# Patient Record
Sex: Female | Born: 1935 | Race: Black or African American | Hispanic: No | Smoking: Former smoker
Health system: Southern US, Community
[De-identification: ages and names within clinical notes are randomized; demographics above are authoritative.]

## PROBLEM LIST (undated history)

## (undated) DIAGNOSIS — K269 Duodenal ulcer, unspecified as acute or chronic, without hemorrhage or perforation: Secondary | ICD-10-CM

## (undated) DIAGNOSIS — I4891 Unspecified atrial fibrillation: Secondary | ICD-10-CM

## (undated) DIAGNOSIS — J449 Chronic obstructive pulmonary disease, unspecified: Secondary | ICD-10-CM

## (undated) DIAGNOSIS — E538 Deficiency of other specified B group vitamins: Secondary | ICD-10-CM

## (undated) DIAGNOSIS — I2510 Atherosclerotic heart disease of native coronary artery without angina pectoris: Secondary | ICD-10-CM

## (undated) DIAGNOSIS — M1990 Unspecified osteoarthritis, unspecified site: Secondary | ICD-10-CM

## (undated) DIAGNOSIS — D649 Anemia, unspecified: Secondary | ICD-10-CM

## (undated) DIAGNOSIS — Z9981 Dependence on supplemental oxygen: Secondary | ICD-10-CM

## (undated) DIAGNOSIS — K5792 Diverticulitis of intestine, part unspecified, without perforation or abscess without bleeding: Secondary | ICD-10-CM

## (undated) DIAGNOSIS — K5520 Angiodysplasia of colon without hemorrhage: Secondary | ICD-10-CM

## (undated) DIAGNOSIS — E785 Hyperlipidemia, unspecified: Secondary | ICD-10-CM

## (undated) DIAGNOSIS — W19XXXA Unspecified fall, initial encounter: Secondary | ICD-10-CM

## (undated) DIAGNOSIS — J9611 Chronic respiratory failure with hypoxia: Secondary | ICD-10-CM

## (undated) DIAGNOSIS — I10 Essential (primary) hypertension: Secondary | ICD-10-CM

## (undated) DIAGNOSIS — C50919 Malignant neoplasm of unspecified site of unspecified female breast: Secondary | ICD-10-CM

## (undated) DIAGNOSIS — R51 Headache: Secondary | ICD-10-CM

## (undated) DIAGNOSIS — M79642 Pain in left hand: Secondary | ICD-10-CM

## (undated) HISTORY — DX: Unspecified atrial fibrillation: I48.91

## (undated) HISTORY — DX: Diverticulitis of intestine, part unspecified, without perforation or abscess without bleeding: K57.92

## (undated) HISTORY — DX: Chronic obstructive pulmonary disease, unspecified: J44.9

## (undated) HISTORY — PX: EYE SURGERY: SHX253

## (undated) HISTORY — PX: MASTECTOMY: SHX3

## (undated) HISTORY — DX: Malignant neoplasm of unspecified site of unspecified female breast: C50.919

## (undated) HISTORY — DX: Unspecified fall, initial encounter: W19.XXXA

## (undated) HISTORY — DX: Hyperlipidemia, unspecified: E78.5

## (undated) HISTORY — PX: OTHER SURGICAL HISTORY: SHX169

## (undated) HISTORY — DX: Pain in left hand: M79.642

---

## 1988-11-15 HISTORY — PX: CORONARY ARTERY BYPASS GRAFT: SHX141

## 2011-07-19 ENCOUNTER — Inpatient Hospital Stay (HOSPITAL_COMMUNITY)
Admission: EM | Admit: 2011-07-19 | Discharge: 2011-07-23 | DRG: 812 | Disposition: A | Discharge status: Home / Self Care | Payer: Medicare Other | Attending: Internal Medicine | Admitting: Internal Medicine

## 2011-07-19 ENCOUNTER — Other Ambulatory Visit: Payer: Self-pay

## 2011-07-19 ENCOUNTER — Emergency Department (HOSPITAL_COMMUNITY): Payer: Medicare Other

## 2011-07-19 DIAGNOSIS — D649 Anemia, unspecified: Secondary | ICD-10-CM | POA: Diagnosis present

## 2011-07-19 DIAGNOSIS — I509 Heart failure, unspecified: Secondary | ICD-10-CM | POA: Diagnosis present

## 2011-07-19 DIAGNOSIS — D126 Benign neoplasm of colon, unspecified: Secondary | ICD-10-CM | POA: Diagnosis present

## 2011-07-19 DIAGNOSIS — I10 Essential (primary) hypertension: Secondary | ICD-10-CM | POA: Diagnosis present

## 2011-07-19 DIAGNOSIS — D509 Iron deficiency anemia, unspecified: Principal | ICD-10-CM | POA: Diagnosis present

## 2011-07-19 DIAGNOSIS — K922 Gastrointestinal hemorrhage, unspecified: Secondary | ICD-10-CM

## 2011-07-19 DIAGNOSIS — Z7901 Long term (current) use of anticoagulants: Secondary | ICD-10-CM

## 2011-07-19 DIAGNOSIS — R0600 Dyspnea, unspecified: Secondary | ICD-10-CM | POA: Diagnosis present

## 2011-07-19 DIAGNOSIS — I2510 Atherosclerotic heart disease of native coronary artery without angina pectoris: Secondary | ICD-10-CM | POA: Diagnosis present

## 2011-07-19 DIAGNOSIS — I4891 Unspecified atrial fibrillation: Secondary | ICD-10-CM | POA: Diagnosis present

## 2011-07-19 DIAGNOSIS — D62 Acute posthemorrhagic anemia: Secondary | ICD-10-CM

## 2011-07-19 DIAGNOSIS — I5030 Unspecified diastolic (congestive) heart failure: Secondary | ICD-10-CM | POA: Diagnosis present

## 2011-07-19 DIAGNOSIS — Z951 Presence of aortocoronary bypass graft: Secondary | ICD-10-CM

## 2011-07-19 HISTORY — DX: Headache: R51

## 2011-07-19 HISTORY — DX: Anemia, unspecified: D64.9

## 2011-07-19 HISTORY — DX: Unspecified osteoarthritis, unspecified site: M19.90

## 2011-07-19 LAB — PROTIME-INR
INR: 1.95 — ABNORMAL HIGH (ref 0.00–1.49)
Prothrombin Time: 22.6 seconds — ABNORMAL HIGH (ref 11.6–15.2)

## 2011-07-19 LAB — URINALYSIS, ROUTINE W REFLEX MICROSCOPIC
Nitrite: NEGATIVE
Specific Gravity, Urine: 1.01 (ref 1.005–1.030)
Urobilinogen, UA: 4 mg/dL — ABNORMAL HIGH (ref 0.0–1.0)
pH: 6.5 (ref 5.0–8.0)

## 2011-07-19 LAB — DIFFERENTIAL
Eosinophils Absolute: 0.1 10*3/uL (ref 0.0–0.7)
Lymphs Abs: 2 10*3/uL (ref 0.7–4.0)
Monocytes Relative: 16 % — ABNORMAL HIGH (ref 3–12)
Neutro Abs: 6.4 10*3/uL (ref 1.7–7.7)
Neutrophils Relative %: 63 % (ref 43–77)

## 2011-07-19 LAB — BASIC METABOLIC PANEL
Calcium: 9.8 mg/dL (ref 8.4–10.5)
GFR calc Af Amer: >60 mL/min (ref >60)
GFR calc non Af Amer: >60 mL/min (ref >60)
Potassium: 3.7 mEq/L (ref 3.5–5.1)
Sodium: 141 mEq/L (ref 135–145)

## 2011-07-19 LAB — RETICULOCYTES
RBC.: 3.85 MIL/uL — ABNORMAL LOW (ref 3.87–5.11)
Retic Count, Absolute: 50.1 10*3/uL (ref 19.0–186.0)

## 2011-07-19 LAB — PRO B NATRIURETIC PEPTIDE: Pro B Natriuretic peptide (BNP): 747.3 pg/mL — ABNORMAL HIGH (ref 0–125)

## 2011-07-19 LAB — CBC
Hemoglobin: 6.4 g/dL — CL (ref 12.0–15.0)
MCH: 16.7 pg — ABNORMAL LOW (ref 26.0–34.0)
Platelets: 459 10*3/uL — ABNORMAL HIGH (ref 150–400)
RBC: 3.83 MIL/uL — ABNORMAL LOW (ref 3.87–5.11)
WBC: 10.2 10*3/uL (ref 4.0–10.5)

## 2011-07-19 LAB — URINE MICROSCOPIC-ADD ON

## 2011-07-19 MED ORDER — ONDANSETRON HCL 4 MG/2ML IJ SOLN: 4.0000 mg | Freq: Four times a day (QID) | INTRAMUSCULAR | Status: DC | PRN

## 2011-07-19 MED ORDER — FUROSEMIDE 10 MG/ML IJ SOLN
20.0000 mg | Freq: Once | INTRAMUSCULAR | Status: AC
  Administered 2011-07-20: 20 mg via INTRAVENOUS
  Filled 2011-07-19: qty 2

## 2011-07-19 MED ORDER — ACETAMINOPHEN 650 MG RE SUPP: 650.0000 mg | Freq: Four times a day (QID) | RECTAL | Status: DC | PRN

## 2011-07-19 MED ORDER — ONDANSETRON HCL 4 MG PO TABS: 4.0000 mg | ORAL_TABLET | Freq: Four times a day (QID) | ORAL | Status: DC | PRN

## 2011-07-19 MED ORDER — MORPHINE SULFATE 2 MG/ML IJ SOLN: 2.0000 mg | INTRAMUSCULAR | Status: DC | PRN

## 2011-07-19 MED ORDER — SIMVASTATIN 20 MG PO TABS
20.0000 mg | ORAL_TABLET | Freq: Every day | ORAL | Status: DC
  Administered 2011-07-19 – 2011-07-22 (×4): 20 mg via ORAL
  Filled 2011-07-19 (×4): qty 1

## 2011-07-19 MED ORDER — NITROGLYCERIN 2 % TD OINT
0.5000 [in_us] | TOPICAL_OINTMENT | Freq: Four times a day (QID) | TRANSDERMAL | Status: DC
  Administered 2011-07-20 (×2): 0.5 [in_us] via TOPICAL
  Administered 2011-07-20: 01:00:00 via TOPICAL
  Administered 2011-07-20: 0.5 [in_us] via TOPICAL
  Administered 2011-07-21: via TOPICAL
  Administered 2011-07-21: 0.5 [in_us] via TOPICAL
  Administered 2011-07-21: 06:00:00 via TOPICAL
  Administered 2011-07-21 – 2011-07-22 (×2): 0.5 [in_us] via TOPICAL
  Administered 2011-07-22: 01:00:00 via TOPICAL
  Administered 2011-07-22 – 2011-07-23 (×4): 0.5 [in_us] via TOPICAL
  Filled 2011-07-19 (×14): qty 1

## 2011-07-19 MED ORDER — SODIUM CHLORIDE 0.9 % IV SOLN
INTRAVENOUS | Status: AC
  Administered 2011-07-19: 23:00:00 via INTRAVENOUS

## 2011-07-19 MED ORDER — SODIUM CHLORIDE 0.9 % IV SOLN: 250.0000 mL | INTRAVENOUS | Status: DC

## 2011-07-19 MED ORDER — NITROGLYCERIN 2 % TD OINT
0.5000 [in_us] | TOPICAL_OINTMENT | Freq: Once | TRANSDERMAL | Status: AC
  Administered 2011-07-19: 19:00:00 via TOPICAL
  Filled 2011-07-19: qty 1

## 2011-07-19 MED ORDER — OXYCODONE HCL 5 MG PO TABS: 5.0000 mg | ORAL_TABLET | ORAL | Status: DC | PRN

## 2011-07-19 MED ORDER — PANTOPRAZOLE SODIUM 40 MG IV SOLR
40.0000 mg | Freq: Two times a day (BID) | INTRAVENOUS | Status: DC
  Administered 2011-07-20 – 2011-07-21 (×4): 40 mg via INTRAVENOUS
  Filled 2011-07-19 (×4): qty 40

## 2011-07-19 MED ORDER — SODIUM CHLORIDE 0.9 % IV SOLN: INTRAVENOUS | Status: DC

## 2011-07-19 MED ORDER — FUROSEMIDE 10 MG/ML IJ SOLN
40.0000 mg | Freq: Two times a day (BID) | INTRAMUSCULAR | Status: DC
  Administered 2011-07-20 – 2011-07-21 (×3): 40 mg via INTRAVENOUS
  Filled 2011-07-19 (×3): qty 4

## 2011-07-19 MED ORDER — METOPROLOL TARTRATE 50 MG PO TABS
50.0000 mg | ORAL_TABLET | Freq: Two times a day (BID) | ORAL | Status: DC
  Administered 2011-07-19 – 2011-07-23 (×8): 50 mg via ORAL
  Filled 2011-07-19 (×8): qty 1

## 2011-07-19 MED ORDER — NITROGLYCERIN 0.4 MG SL SUBL
0.4000 mg | SUBLINGUAL_TABLET | SUBLINGUAL | Status: DC | PRN
  Administered 2011-07-19: 0.4 mg via SUBLINGUAL
  Filled 2011-07-19: qty 25

## 2011-07-19 MED ORDER — ACETAMINOPHEN 325 MG PO TABS
650.0000 mg | ORAL_TABLET | Freq: Four times a day (QID) | ORAL | Status: DC | PRN
  Administered 2011-07-21 – 2011-07-22 (×4): 650 mg via ORAL
  Filled 2011-07-19 (×4): qty 2

## 2011-07-19 MED ORDER — SODIUM CHLORIDE 0.9 % IJ SOLN
3.0000 mL | INTRAMUSCULAR | Status: DC | PRN
  Filled 2011-07-19: qty 3

## 2011-07-19 MED ORDER — SODIUM CHLORIDE 0.9 % IJ SOLN
3.0000 mL | Freq: Two times a day (BID) | INTRAMUSCULAR | Status: DC
  Administered 2011-07-19 – 2011-07-22 (×5): 3 mL via INTRAVENOUS
  Filled 2011-07-19 (×5): qty 3

## 2011-07-19 MED ORDER — PANTOPRAZOLE SODIUM 40 MG IV SOLR
40.0000 mg | Freq: Once | INTRAVENOUS | Status: AC
  Administered 2011-07-19: 40 mg via INTRAVENOUS
  Filled 2011-07-19: qty 40

## 2011-07-19 MED ORDER — ALBUTEROL SULFATE (5 MG/ML) 0.5% IN NEBU: 2.5000 mg | INHALATION_SOLUTION | RESPIRATORY_TRACT | Status: DC | PRN

## 2011-07-19 MED ORDER — FUROSEMIDE 10 MG/ML IJ SOLN
40.0000 mg | Freq: Once | INTRAMUSCULAR | Status: AC
  Administered 2011-07-19: 40 mg via INTRAVENOUS
  Filled 2011-07-19: qty 4

## 2011-07-19 NOTE — H&P (Signed)
Yesenia Woods is an 76 y.o. female.  Her primary care physician is Dr. Byrd Hesselbach in [place], [place]. Her cardiologist is Dr. Earna Coder in [place], [place].  Chief Complaint: Shortness of breath for one week  HPI: This is a 76 year old, African American female, with a past medical history coronary artery disease, who was in her usual state of health till about a week ago, when she started having worsening shortness of breath. She also had some chest fullness as she describes it. This was associated with some leg swelling, more so in the left leg. She denies any weight gain. She would get short of breath, even with short distances. She usually otherwise is quite independent with her physical activity. She denies any fever or cough. Has been having some chills. Admits to some palpitations. Admits to orthopnea and PND. She tells that she was put on Coumadin for unclear reasons about 6 months ago. She tells me she could have had a regular arrhythmias, but she is not absolutely sure. Denies any sick contacts. Denies any blood in the stool or black colored stools. She's had a colonoscopy, but many years ago, and she doesn't recall it. She was found to have heme positive stool in the emergency department.   Prior to Admission medications   Medication Sig Start Date End Date Taking? Authorizing Provider  aspirin 325 MG EC tablet Take 325 mg by mouth daily.     Yes Historical Provider, MD  metoprolol (LOPRESSOR) 50 MG tablet Take 50 mg by mouth 2 (two) times daily.     Yes Historical Provider, MD  omeprazole (PRILOSEC) 20 MG capsule Take 20 mg by mouth daily.     Yes Historical Provider, MD  pravastatin (PRAVACHOL) 40 MG tablet Take 40 mg by mouth at bedtime.     Yes Historical Provider, MD  warfarin (COUMADIN) 4 MG tablet Take 4 mg by mouth daily. Takes Tuesday and Fridays    Yes Historical Provider, MD  warfarin (COUMADIN) 7.5 MG tablet Take 7.5 mg by mouth daily. Except for Tuesday and Friday    Yes  Historical Provider, MD    Allergies: No Known Allergies  Past Medical History  Diagnosis Date  . Myocardial infarct   . Cancer     breast   she has a history of cardiac disease. She status post CABG about 24 years ago. She's had the cardiac catheterization since then, however, did not require stent placement. She denies any history of, diabetes, or strokes. Does have hypertension, and high cholesterol. Once, again. History of A. fib, is questionable.  Past Surgical History  Procedure Date  . Coronary artery bypass graft   . Breast surgery    She's also had cataract surgeries and foot surgeries.  Social History:  reports that she has quit smoking. She does not have any smokeless tobacco history on file. She reports that she does not drink alcohol or use illicit drugs. she lives by herself in [place], [place].  Family History: No family history on file.  Review of Systems  Constitutional: Positive for chills. Negative for fever, weight loss and malaise/fatigue.  HENT: Negative.   Eyes: Negative.   Respiratory: Positive for shortness of breath. Negative for cough, hemoptysis, sputum production and wheezing.   Cardiovascular: Positive for chest pain, palpitations, orthopnea, leg swelling and PND.  Gastrointestinal: Positive for heartburn. Negative for nausea, vomiting, abdominal pain, blood in stool and melena.  Genitourinary: Negative.   Musculoskeletal: Negative.   Skin: Negative.   Neurological: Positive for  weakness.  Endo/Heme/Allergies: Negative.   Psychiatric/Behavioral: Negative.     Blood pressure 157/126, pulse 58, temperature 98.3 F (36.8 C), temperature source Oral, resp. rate 20, height 5' (1.524 m), weight 70.308 kg (155 lb), SpO2 95.00%. Physical Exam  Vitals reviewed. Constitutional: She is oriented to person, place, and time. She appears well-developed and well-nourished. No distress.  HENT:  Head: Normocephalic.  Mouth/Throat: No oropharyngeal exudate.    Eyes: EOM are normal. Right eye exhibits no discharge. Left eye exhibits no discharge. No scleral icterus.  Neck: Normal range of motion. No tracheal deviation present. No thyromegaly present.  Cardiovascular: Normal rate.  An irregular rhythm present.  No extrasystoles are present. Exam reveals no gallop.   No murmur heard. Pulmonary/Chest: Effort normal. Stridor present. No respiratory distress. She has no wheezes. She has no rales. She exhibits no tenderness.  Abdominal: Soft. She exhibits no distension and no mass. There is no tenderness. There is no rebound and no guarding.  Musculoskeletal: Normal range of motion.  Lymphadenopathy:    She has no cervical adenopathy.  Neurological: She is alert and oriented to person, place, and time.  Skin: Skin is warm and dry. She is not diaphoretic.  Psychiatric: She has a normal mood and affect.     Results for orders placed during the hospital encounter of 07/19/11 (from the past 48 hour(s))  URINALYSIS, ROUTINE W REFLEX MICROSCOPIC     Status: Abnormal   Collection Time   07/19/11  2:16 PM      Component Value Range Comment   Color, Urine YELLOW  YELLOW     Appearance HAZY (*) CLEAR     Specific Gravity, Urine 1.010  1.005 - 1.030     pH 6.5  5.0 - 8.0     Glucose, UA NEGATIVE  NEGATIVE (mg/dL)    Hgb urine dipstick TRACE (*) NEGATIVE     Bilirubin Urine NEGATIVE  NEGATIVE     Ketones, ur NEGATIVE  NEGATIVE (mg/dL)    Protein, ur NEGATIVE  NEGATIVE (mg/dL)    Urobilinogen, UA 4.0 (*) 0.0 - 1.0 (mg/dL)    Nitrite NEGATIVE  NEGATIVE     Leukocytes, UA SMALL (*) NEGATIVE    URINE MICROSCOPIC-ADD ON     Status: Normal   Collection Time   07/19/11  2:16 PM      Component Value Range Comment   Squamous Epithelial / LPF RARE  RARE     WBC, UA 7-10  <3 (WBC/hpf)    RBC / HPF 3-6  <3 (RBC/hpf)    Bacteria, UA RARE  RARE    BASIC METABOLIC PANEL     Status: Normal   Collection Time   07/19/11  2:20 PM      Component Value Range Comment    Sodium 141  135 - 145 (mEq/L)    Potassium 3.7  3.5 - 5.1 (mEq/L)    Chloride 104  96 - 112 (mEq/L)    CO2 26  19 - 32 (mEq/L)    Glucose, Bld 97  70 - 99 (mg/dL)    BUN 10  6 - 23 (mg/dL)    Creatinine, Ser 9.56  0.50 - 1.10 (mg/dL)    Calcium 9.8  8.4 - 10.5 (mg/dL)    GFR calc non Af Amer >60  >60 (mL/min)    GFR calc Af Amer >60  >60 (mL/min)   CBC     Status: Abnormal   Collection Time   07/19/11  2:20 PM  Component Value Range Comment   WBC 10.2  4.0 - 10.5 (K/uL)    RBC 3.83 (*) 3.87 - 5.11 (MIL/uL)    Hemoglobin 6.4 (*) 12.0 - 15.0 (g/dL)    HCT 16.1 (*) 09.6 - 46.0 (%)    MCV 58.2 (*) 78.0 - 100.0 (fL)    MCH 16.7 (*) 26.0 - 34.0 (pg)    MCHC 28.7 (*) 30.0 - 36.0 (g/dL)    RDW 04.5 (*) 40.9 - 15.5 (%)    Platelets 459 (*) 150 - 400 (K/uL)   DIFFERENTIAL     Status: Abnormal   Collection Time   07/19/11  2:20 PM      Component Value Range Comment   Neutrophils Relative 63  43 - 77 (%)    Neutro Abs 6.4  1.7 - 7.7 (K/uL)    Lymphocytes Relative 20  12 - 46 (%)    Lymphs Abs 2.0  0.7 - 4.0 (K/uL)    Monocytes Relative 16 (*) 3 - 12 (%)    Monocytes Absolute 1.6 (*) 0.1 - 1.0 (K/uL)    Eosinophils Relative 1  0 - 5 (%)    Eosinophils Absolute 0.1  0.0 - 0.7 (K/uL)    Basophils Relative 1  0 - 1 (%)    Basophils Absolute 0.1  0.0 - 0.1 (K/uL)   PRO B NATRIURETIC PEPTIDE     Status: Abnormal   Collection Time   07/19/11  2:20 PM      Component Value Range Comment   BNP, POC 747.3 (*) 0 - 125 (pg/mL)   PROTIME-INR     Status: Abnormal   Collection Time   07/19/11  2:20 PM      Component Value Range Comment   Prothrombin Time 22.6 (*) 11.6 - 15.2 (seconds)    INR 1.95 (*) 0.00 - 1.49    APTT     Status: Abnormal   Collection Time   07/19/11  2:20 PM      Component Value Range Comment   aPTT 40 (*) 24 - 37 (seconds)   POCT I-STAT TROPONIN I     Status: Normal   Collection Time   07/19/11  2:34 PM      Component Value Range Comment   Troponin i, poc 0.01  0.00 - 0.08  (ng/mL)    Comment 3            TYPE AND SCREEN     Status: Normal   Collection Time   07/19/11  3:40 PM      Component Value Range Comment   ABO/RH(D) O POS      Antibody Screen POS      Sample Expiration 07/22/2011     ABO/RH     Status: Normal (Preliminary result)   Collection Time   07/19/11  3:40 PM      Component Value Range Comment   ABO/RH(D) O POS     PREPARE RBC (CROSSMATCH)     Status: Normal   Collection Time   07/19/11  3:40 PM      Component Value Range Comment   Order Confirmation ORDER PROCESSED BY BLOOD BANK     POCT I-STAT TROPONIN I     Status: Normal   Collection Time   07/19/11  6:28 PM      Component Value Range Comment   Troponin i, poc 0.00  0.00 - 0.08 (ng/mL)    Comment 3  Dg Chest 2 View  07/19/2011  *RADIOLOGY REPORT*  Clinical Data: Chest pain and shortness of breath.  CHEST - 2 VIEW  Comparison: None.  Findings: Trachea is midline.  Heart is at the upper limits of normal in size.  Peripheral septal lines are seen in the lung bases.  Bibasilar atelectasis and/or scarring.  No definite pleural fluid.  IMPRESSION: Early basilar dependent interstitial pulmonary edema.  Original Report Authenticated By: Reyes Ivan, M.D.   There are 2 EKGs available and both of which look quite similar. They show what appears to be a sinus rhythm. However, on the monitor she has irregular rhythm currently. Axes normal. Intervals appear to be in the normal range, although she does have first-degree AV block. She has ST depression in V4 5 and 6. Similar findings in both EKGs. We don't have any old EKGs for comparison.  Assessment/Plan  Principal Problem:  *Dyspnea Active Problems:  Anemia  CAD (coronary artery disease)  HTN (hypertension)  Anticoagulated on warfarin   #1 dyspnea: This is probably from anemia. It is unclear if she has a history of CHF. She's not wheezing at this time. So, I feel that this is symptomatic anemia. She did get a dose of Lasix in the  ED, however, she is not able to tell me she felt better after that or not. We will continue with IV Lasix for now. She'll probably require only at about 3-4 doses. At some point an x-ray will have to be repeated of her chest. We'll order an echocardiogram. She tells me she was recently tested in her cardiologist's office and will request those records.  #2 microcytic anemia with heme positive stools: Patient is on warfarin. We'll check an anemia panel. Since she has symptomatic anemia we will transfuse her blood. We will have gastroenterology see her, as it's quite likely she may require a colonoscopy. We'll put on a PPI as well. Request old records.  #3 coronary artery disease, status post CABG: This issue  Is stable. Continue with beta blocker. Hold of on aspirin for now. Repeat EKG to get a better look at rhythm.  #4 on anticoagulation: The reason for this is not entirely clear. It looks like she may have had paroxysmal A. fib, which may have prompted this. INR is subtherapeutic. Because of the severe anemia and heme positive, stools we'll hold off on Coumadin. However, because she's not actively bleeding we will not give her any vitamin K or FFP at this time.  DVT, prophylaxis with SCDs. She's a full code. She can be admitted to tele as she is hemodynamically stable.  Further management decisions will depend on results of further testing and patient's response to treatment  Euretha Najarro 07/19/2011, 7:39 PM

## 2011-07-19 NOTE — ED Notes (Signed)
Sob with chest "tightness" for 1 week per pt. Denies nausea or radiation of pain. Sob worse with exertion and pt states " i have to sleep with 3 pillows at night"

## 2011-07-19 NOTE — ED Notes (Signed)
guiac positive

## 2011-07-19 NOTE — ED Notes (Signed)
Lab called and stated pt had antibody in blood and will have to send to [facility] and will be approx 2 hour delay. Dr. Clarene Duke aware.

## 2011-07-19 NOTE — ED Notes (Signed)
Pt c/o chest tightness and sob for 1 week at home. Exertion makes symptoms worse. Denies any nausea. Denies any radiation of pain. Pt has Cardiologist Dr. Beckie Salts in [place], [place]. Pt states within last she has increased to 3 pillows at night.

## 2011-07-19 NOTE — ED Notes (Signed)
Troponin I stat done but did not cross over. Resulted 0.01ng/ml-C [place]

## 2011-07-19 NOTE — ED Notes (Addendum)
CRITICAL VALUE ALERT  Critical value received:  HGB 6.4   Date of notification: 07/19/11  Time of notification:  1514  Critical value read back:yes  Nurse who received alert:  Aquilla Hacker  MD notified (1st page):  Dr Richrd Prime  Time MD Notified: [phone]

## 2011-07-19 NOTE — ED Provider Notes (Signed)
History     CSN: [id number] Arrival date & time: 07/19/2011  1:39 PM  Chief Complaint  Patient presents with  . Chest Pain  . Shortness of Breath   HPI Pt was seen at 1430.  Per pt, c/o gradual onset and worsening of persistent CP and SOb x1 week.  Describes the CP as "tightness."  Symptoms occur on exertion.  States she has been increasing the number of pillows she props herself up on at night to sleep, as well as is unable to walk the same distances she usually did due to increasing symptoms of SOB and chest tightness.  Denies palpitations, no cough, no fevers, no abd pain, no back pain, no N/V/D.      Cards:  Dr. Beckie Salts in [place], [place] Past Medical History  Diagnosis Date  . Myocardial infarct   . Cancer     breast    Past Surgical History  Procedure Date  . Coronary artery bypass graft   . Breast surgery     No family history on file.  History  Substance Use Topics  . Smoking status: Former Games developer  . Smokeless tobacco: Not on file  . Alcohol Use: No    Review of Systems ROS: Statement: All systems negative except as marked or noted in the HPI; Constitutional: Negative for fever and chills. ; ; Eyes: Negative for eye pain, redness and discharge. ; ; ENMT: Negative for ear pain, hoarseness, nasal congestion, sinus pressure and sore throat. ; ; Cardiovascular: Negative for palpitations, diaphoresis, and peripheral edema. +CP, SOB, DOE, orthopnea ; Respiratory: Negative for cough, wheezing and stridor. ; ; Gastrointestinal: Negative for nausea, vomiting, diarrhea and abdominal pain, blood in stool, hematemesis, jaundice and rectal bleeding. . ; ; Genitourinary: Negative for dysuria, flank pain and hematuria. ; ; Musculoskeletal: Negative for back pain and neck pain. Negative for swelling and trauma.; ; Skin: Negative for pruritus, rash, abrasions, blisters, bruising and skin lesion.; ; Neuro: Negative for headache, lightheadedness and neck stiffness. Negative for weakness, altered  level of consciousness , altered mental status, extremity weakness, paresthesias, involuntary movement, seizure and syncope.     Physical Exam  BP 157/126  Pulse 58  Temp(Src) 98.3 F (36.8 C) (Oral)  Resp 20  Ht 5' (1.524 m)  Wt 155 lb (70.308 kg)  BMI 30.27 kg/m2  SpO2 95% Patient Vitals for the past 24 hrs:  BP Temp Temp src Pulse Resp SpO2 Height Weight  07/19/11 1526 157/126 mmHg - - 58  - 95 % - -  07/19/11 1512 153/65 mmHg - - 78  20  100 % - -  07/19/11 1409 - - - - - 97 % - -  07/19/11 1405 174/69 mmHg 98.3 F (36.8 C) Oral 62  22  98 % 5' (1.524 m) 155 lb (70.308 kg)     Physical Exam 1435: Physical examination:  Nursing notes reviewed; Vital signs and O2 SAT reviewed;  Constitutional: Well developed, Well nourished, Well hydrated, In no acute distress; Head:  Normocephalic, atraumatic; Eyes: EOMI, PERRL, No scleral icterus; ENMT: Mouth and pharynx normal, Mucous membranes moist, conjunctiva pale; Neck: Supple, Full range of motion, No lymphadenopathy; Cardiovascular: Regular rate and rhythm, No murmur, rub, or gallop; Respiratory: Breath sounds clear & equal bilaterally, No rales, rhonchi, wheezes, or rub, Normal respiratory effort/excursion; Chest: Nontender, Movement normal; Abdomen: Soft, Nontender, Nondistended, Normal bowel sounds; Extremities: Pulses normal, No tenderness, No edema, No calf edema or asymmetry.; Neuro: AA&Ox3, Major CN grossly intact.  Speech  clear, no facial droop.  No gross focal motor or sensory deficits in extremities.; Skin: Color pale, Warm, Dry.   ED Course  Procedures  MDM:  No old records to compare. MDM Reviewed: nursing note and vitals Interpretation: ECG, labs and x-ray    Date: 07/19/2011  Rate: 62  Rhythm: normal sinus rhythm  QRS Axis: normal  Intervals: PR prolonged  ST/T Wave abnormalities: ST depressions anteriorly and ST depressions laterally  Conduction Disutrbances:first-degree A-V block   Narrative Interpretation:    Old EKG Reviewed: none available, no previous EKG's on file.  Results for orders placed during the hospital encounter of 07/19/11  BASIC METABOLIC PANEL      Component Value Range   Sodium 141  135 - 145 (mEq/L)   Potassium 3.7  3.5 - 5.1 (mEq/L)   Chloride 104  96 - 112 (mEq/L)   CO2 26  19 - 32 (mEq/L)   Glucose, Bld 97  70 - 99 (mg/dL)   BUN 10  6 - 23 (mg/dL)   Creatinine, Ser 1.61  0.50 - 1.10 (mg/dL)   Calcium 9.8  8.4 - 09.6 (mg/dL)   GFR calc non Af Amer >60  >60 (mL/min)   GFR calc Af Amer >60  >60 (mL/min)  CBC      Component Value Range   WBC 10.2  4.0 - 10.5 (K/uL)   RBC 3.83 (*) 3.87 - 5.11 (MIL/uL)   Hemoglobin 6.4 (*) 12.0 - 15.0 (g/dL)   HCT 04.5 (*) 40.9 - 46.0 (%)   MCV 58.2 (*) 78.0 - 100.0 (fL)   MCH 16.7 (*) 26.0 - 34.0 (pg)   MCHC 28.7 (*) 30.0 - 36.0 (g/dL)   RDW 81.1 (*) 91.4 - 15.5 (%)   Platelets 459 (*) 150 - 400 (K/uL)  DIFFERENTIAL      Component Value Range   Neutrophils Relative 63  43 - 77 (%)   Neutro Abs 6.4  1.7 - 7.7 (K/uL)   Lymphocytes Relative 20  12 - 46 (%)   Lymphs Abs 2.0  0.7 - 4.0 (K/uL)   Monocytes Relative 16 (*) 3 - 12 (%)   Monocytes Absolute 1.6 (*) 0.1 - 1.0 (K/uL)   Eosinophils Relative 1  0 - 5 (%)   Eosinophils Absolute 0.1  0.0 - 0.7 (K/uL)   Basophils Relative 1  0 - 1 (%)   Basophils Absolute 0.1  0.0 - 0.1 (K/uL)  PRO B NATRIURETIC PEPTIDE      Component Value Range   BNP, POC 747.3 (*) 0 - 125 (pg/mL)  PROTIME-INR      Component Value Range   Prothrombin Time 22.6 (*) 11.6 - 15.2 (seconds)   INR 1.95 (*) 0.00 - 1.49   APTT      Component Value Range   aPTT 40 (*) 24 - 37 (seconds)  URINALYSIS, ROUTINE W REFLEX MICROSCOPIC      Component Value Range   Color, Urine YELLOW  YELLOW    Appearance HAZY (*) CLEAR    Specific Gravity, Urine 1.010  1.005 - 1.030    pH 6.5  5.0 - 8.0    Glucose, UA NEGATIVE  NEGATIVE (mg/dL)   Hgb urine dipstick TRACE (*) NEGATIVE    Bilirubin Urine NEGATIVE  NEGATIVE     Ketones, ur NEGATIVE  NEGATIVE (mg/dL)   Protein, ur NEGATIVE  NEGATIVE (mg/dL)   Urobilinogen, UA 4.0 (*) 0.0 - 1.0 (mg/dL)   Nitrite NEGATIVE  NEGATIVE    Leukocytes, UA  SMALL (*) NEGATIVE   URINE MICROSCOPIC-ADD ON      Component Value Range   Squamous Epithelial / LPF RARE  RARE    WBC, UA 7-10  <3 (WBC/hpf)   RBC / HPF 3-6  <3 (RBC/hpf)   Bacteria, UA RARE  RARE   TYPE AND SCREEN      Component Value Range   ABO/RH(D) O POS     Antibody Screen POS     Sample Expiration 07/22/2011    ABO/RH      Component Value Range   ABO/RH(D) O POS    PREPARE RBC (CROSSMATCH)      Component Value Range   Order Confirmation ORDER PROCESSED BY BLOOD BANK    POCT I-STAT TROPONIN I      Component Value Range   Troponin i, poc 0.01  0.00 - 0.08 (ng/mL)   Comment 3            Dg Chest 2 View  07/19/2011  *RADIOLOGY REPORT*  Clinical Data: Chest pain and shortness of breath.  CHEST - 2 VIEW  Comparison: None.  Findings: Trachea is midline.  Heart is at the upper limits of normal in size.  Peripheral septal lines are seen in the lung bases.  Bibasilar atelectasis and/or scarring.  No definite pleural fluid.  IMPRESSION: Early basilar dependent interstitial pulmonary edema.  Original Report Authenticated By: Reyes Ivan, M.D.    5:34 PM:  Pt denies having black or bloody stools.  Soft brown stool in rectal vault heme positive.  T&S pending due to antibodies in pt's blood.  Will need transfusion.  Cannot recall why she takes coumadin, only that her Cards MD put her on it.  Denies symptoms of SOB or CP while laying on stretcher.  Will tx CHF for now.  Dx testing d/w pt and family.  Questions answered.  Verb understanding, agreeable to admit.  5:40 PM:  T/C to [facility] Dr. Ardyth Harps, case discussed, including:  HPI, pertinent PM/SHx, VS/PE, dx testing, ED course and treatment.  Agreeable to admit.  Requests to write temporary orders, ICU bed to team AP2.  Gertrude Bucks Allison Quarry,  DO 07/20/11 2216

## 2011-07-20 DIAGNOSIS — K922 Gastrointestinal hemorrhage, unspecified: Secondary | ICD-10-CM

## 2011-07-20 DIAGNOSIS — I059 Rheumatic mitral valve disease, unspecified: Secondary | ICD-10-CM

## 2011-07-20 LAB — CBC
HCT: 26.4 % — ABNORMAL LOW (ref 36.0–46.0)
Hemoglobin: 7.6 g/dL — ABNORMAL LOW (ref 12.0–15.0)
MCH: 18.1 pg — ABNORMAL LOW (ref 26.0–34.0)
MCHC: 29.6 g/dL — ABNORMAL LOW (ref 30.0–36.0)
MCHC: 29.9 g/dL — ABNORMAL LOW (ref 30.0–36.0)
Platelets: 494 10*3/uL — ABNORMAL HIGH (ref 150–400)
Platelets: 463 10*3/uL — ABNORMAL HIGH (ref 150–400)
RDW: 26.9 % — ABNORMAL HIGH (ref 11.5–15.5)
WBC: 10.5 10*3/uL (ref 4.0–10.5)

## 2011-07-20 LAB — COMPREHENSIVE METABOLIC PANEL
ALT: 10 U/L (ref 0–35)
AST: 12 U/L (ref 0–37)
CO2: 29 mEq/L (ref 19–32)
Chloride: 103 mEq/L (ref 96–112)
GFR calc non Af Amer: >60 mL/min (ref >60)
Sodium: 144 mEq/L (ref 135–145)
Total Bilirubin: 0.7 mg/dL (ref 0.3–1.2)

## 2011-07-20 LAB — URINALYSIS, ROUTINE W REFLEX MICROSCOPIC
Bilirubin Urine: NEGATIVE
Glucose, UA: NEGATIVE mg/dL
Hgb urine dipstick: NEGATIVE
Specific Gravity, Urine: 1.01 (ref 1.005–1.030)
Urobilinogen, UA: 0.2 mg/dL (ref 0.0–1.0)

## 2011-07-20 LAB — URINE CULTURE: Culture: NO GROWTH

## 2011-07-20 LAB — CARDIAC PANEL(CRET KIN+CKTOT+MB+TROPI)
CK, MB: 2.3 ng/mL (ref 0.3–4.0)
Relative Index: INVALID (ref 0.0–2.5)
Total CK: 49 U/L (ref 7–177)
Total CK: 58 U/L (ref 7–177)
Troponin I: <0.3 ng/mL (ref <0.30)
Troponin I: <0.3 ng/mL (ref <0.30)

## 2011-07-20 LAB — TRANSFUSION REACTION: DAT C3: NEGATIVE

## 2011-07-20 LAB — HEMOGLOBIN A1C: Mean Plasma Glucose: 131 mg/dL — ABNORMAL HIGH (ref <117)

## 2011-07-20 LAB — PROTIME-INR: Prothrombin Time: 22.4 seconds — ABNORMAL HIGH (ref 11.6–15.2)

## 2011-07-20 LAB — IRON AND TIBC: UIBC: 443 ug/dL — ABNORMAL HIGH (ref 125–400)

## 2011-07-20 LAB — FERRITIN: Ferritin: 7 ng/mL — ABNORMAL LOW (ref 10–291)

## 2011-07-20 MED ORDER — SODIUM CHLORIDE 0.9 % IJ SOLN
INTRAMUSCULAR | Status: AC
  Administered 2011-07-20: 10:00:00
  Filled 2011-07-20: qty 10

## 2011-07-20 MED ORDER — DIPHENHYDRAMINE HCL 50 MG/ML IJ SOLN
INTRAMUSCULAR | Status: AC
  Filled 2011-07-20: qty 1

## 2011-07-20 MED ORDER — DIPHENHYDRAMINE HCL 50 MG/ML IJ SOLN
25.0000 mg | Freq: Once | INTRAMUSCULAR | Status: AC
  Administered 2011-07-20: 25 mg via INTRAVENOUS
  Filled 2011-07-20: qty 1

## 2011-07-20 MED ORDER — DIPHENHYDRAMINE HCL 50 MG/ML IJ SOLN
25.0000 mg | Freq: Once | INTRAMUSCULAR | Status: AC
  Administered 2011-07-20: 25 mg via INTRAVENOUS

## 2011-07-20 MED ORDER — SODIUM CHLORIDE 0.9 % IJ SOLN
INTRAMUSCULAR | Status: AC
  Administered 2011-07-20: 22:00:00
  Filled 2011-07-20: qty 10

## 2011-07-20 NOTE — Progress Notes (Signed)
Pt c/o of itching to left chest wall after blood transfusion completed.  Area did have a few welps present.  No other complications noted. MD and lab made aware.  N.O received.  Will continue to monitor.  Pincus Badder, RN

## 2011-07-20 NOTE — Progress Notes (Signed)
Subjective: Feels well. Is hungry.  Objective: Vital signs in last 24 hours: Temp:  [98.2 F (36.8 C)-98.6 F (37 C)] 98.2 F (36.8 C) (09/04 0600) Pulse Rate:  [58-90] 62  (09/04 0600) Resp:  [20-25] 20  (09/04 0600) BP: (144-174)/(65-126) 149/72 mmHg (09/04 0600) SpO2:  [95 %-100 %] 99 % (09/04 0816) Weight:  [70.308 kg (155 lb)] 155 lb (70.308 kg) (09/03 1405) Weight change:  Last BM Date: 07/19/11  Intake/Output from previous day: 09/03 0701 - 09/04 0700 In: 850 [I.V.:500; Blood:350] Out: 1500 [Urine:1500]     Physical Exam: General: Alert, awake, oriented x3, in no acute distress. HEENT: No bruits, no goiter. Heart: Regular rate and rhythm, without murmurs, rubs, gallops. Lungs: Clear to auscultation bilaterally. Abdomen: Soft, nontender, nondistended, positive bowel sounds. Extremities: No clubbing cyanosis or edema with positive pedal pulses. Neuro: Grossly intact, nonfocal.    Lab Results:  Basename 07/20/11 0543 07/19/11 1420  WBC 11.0* 10.2  HGB 7.6* 6.4*  HCT 25.7* 22.3*  PLT 494* 459*    Basename 07/20/11 0543 07/19/11 1420  NA 144 141  K 3.7 3.7  CL 103 104  CO2 29 26  GLUCOSE 89 97  BUN 9 10  CREATININE 0.75 0.69  CALCIUM 10.0 9.8   No results found for this or any previous visit (from the past 240 hour(s)).   Studies/Results: Dg Chest 2 View  07/19/2011  *RADIOLOGY REPORT*  Clinical Data: Chest pain and shortness of breath.  CHEST - 2 VIEW  Comparison: None.  Findings: Trachea is midline.  Heart is at the upper limits of normal in size.  Peripheral septal lines are seen in the lung bases.  Bibasilar atelectasis and/or scarring.  No definite pleural fluid.  IMPRESSION: Early basilar dependent interstitial pulmonary edema.  Original Report Authenticated By: Reyes Ivan, M.D.    Medications: Scheduled Meds:   . sodium chloride   Intravenous STAT  . diphenhydrAMINE      . diphenhydrAMINE  25 mg Intravenous Once  . diphenhydrAMINE  25  mg Intravenous Once  . furosemide  20 mg Intravenous Once  . furosemide  40 mg Intravenous Once  . furosemide  40 mg Intravenous Q12H  . metoprolol  50 mg Oral BID  . nitroGLYCERIN  0.5 inch Topical Once  . nitroGLYCERIN  0.5 inch Topical Q6H  . pantoprazole (PROTONIX) IV  40 mg Intravenous Q12H  . pantoprazole (PROTONIX) IV  40 mg Intravenous Once  . simvastatin  20 mg Oral QHS  . sodium chloride  3 mL Intravenous Q12H  . sodium chloride       Continuous Infusions:   . sodium chloride    . DISCONTD: sodium chloride     PRN Meds:.acetaminophen, acetaminophen, albuterol, morphine, nitroGLYCERIN, ondansetron (ZOFRAN) IV, ondansetron, oxyCODONE, sodium chloride  Assessment/Plan:  Principal Problem:  *Dyspnea Active Problems:  Anemia  CAD (coronary artery disease)  HTN (hypertension)  Anticoagulated on warfarin  #1 shortness of breath: I believe this is likely secondary to symptomatic anemia. She received one unit of PRBCs and is pending to have the second unit after clearance by the blood bank given a possible transfusion reaction and an abnormal antibody.  #2 EKG changes: She was noted to have ST depression in leads V4 5 and 6 on admission. I believe this is likely secondary to her anemia. So far cardiac enzymes have been negative. We'll continue to cycle cardiac enzymes and repeat EKG in the morning. She does not complain of any chest pain.  #  3 anemia: She was found to have heme positive stools. We'll need to rule out an occult GI bleed. GI has already seen her and is planning for a colonoscopy once cardiac situation has been settled. It appears her cardiologist had started her on Coumadin for what appears to have been atrial fibrillation (patient states an irregular rhythm). Coumadin is currently on hold given anemia and GI bleed.  #4 possible transfusion reaction: On her type and screen she was found to have an antibody. Blood bank cleared 2 units for her, however after  transfusion of the first unit she started to complain of itching over her left chest wall and was noted to have some hives to the area. Pathologist was called and recommended ordering hemolytic studies. These have resulted negative. Dr.Arund, pathologist, has called me this morning as he believes that it is okay to proceed with transfusion of second unit.   LOS: 1 day   Yesenia Woods,Yesenia Woods 07/20/2011, 10:47 AM

## 2011-07-20 NOTE — Progress Notes (Signed)
*  PRELIMINARY RESULTS* Echocardiogram 2D Echocardiogram has been performed.  Conrad [place] 07/20/2011, 4:30 PM

## 2011-07-20 NOTE — Consult Note (Signed)
Reason for Consult:anemia Referring Physician:   Lucky Alverson is an 76 y.o. black female admitted last night thru the ED.  She c/o SOB and chest pain.  She had been SOB x 1 week.  She has a past hx of CAD and underwent a CABG 24 yrs ago after having an MI.  She has a hx of an irregular heart beat and takes Coumadin.  IT was noted on admission her hemoglobin was 6.4. She was guaiac positive in the ED last night. Her last colonoscopy was 8-9 yr ago in [place] and she reports it was normal.  Her appetite has been good up to 3-4 days ago.  She usually has one BM daily.  She does not look at her stools so she could not tell me if they were brown or black.  She did see blood about 2 months ago when she wiped after having a BM.  Her acid reflu is controlled with a PPI. There is not family hx of colon cancer.  PT/INR today 22.5 and 1.03. Her cardiac marker so far are normal x 3 .She has received one unit packed RBCs and will receive another.  Her chest xray revealed early basilar dependent interstitial pulmonary edema. .  Dg Chest 2 View  07/19/2011  *RADIOLOGY REPORT*  Clinical Data: Chest pain and shortness of breath.  CHEST - 2 VIEW  Comparison: None.  Findings: Trachea is midline.  Heart is at the upper limits of normal in size.  Peripheral septal lines are seen in the lung bases.  Bibasilar atelectasis and/or scarring.  No definite pleural fluid.  IMPRESSION: Early basilar dependent interstitial pulmonary edema.  Original Report Authenticated By: Reyes Ivan, M.D.     Past Medical History  Diagnosis Date  . Myocardial infarct   . Cancer     breast    Past Surgical History  Procedure Date  . Coronary artery bypass graft   . Breast surgery   . Eye surgery     History reviewed. No pertinent family history.  No family hx of colon cancer.  Social History:  reports that she has quit smoking. She does not have any smokeless tobacco history on file. She reports that she drinks about .6 ounces  of alcohol per week. She reports that she does not use illicit drugs.  Allergies: No Known Allergies  Medications: I have reviewed the patient's current medications.   Results for orders placed during the hospital encounter of 07/19/11 (from the past 48 hour(s))  URINALYSIS, ROUTINE W REFLEX MICROSCOPIC     Status: Abnormal   Collection Time   07/19/11  2:16 PM      Component Value Range Comment   Color, Urine YELLOW  YELLOW     Appearance HAZY (*) CLEAR     Specific Gravity, Urine 1.010  1.005 - 1.030     pH 6.5  5.0 - 8.0     Glucose, UA NEGATIVE  NEGATIVE (mg/dL)    Hgb urine dipstick TRACE (*) NEGATIVE     Bilirubin Urine NEGATIVE  NEGATIVE     Ketones, ur NEGATIVE  NEGATIVE (mg/dL)    Protein, ur NEGATIVE  NEGATIVE (mg/dL)    Urobilinogen, UA 4.0 (*) 0.0 - 1.0 (mg/dL)    Nitrite NEGATIVE  NEGATIVE     Leukocytes, UA SMALL (*) NEGATIVE    URINE MICROSCOPIC-ADD ON     Status: Normal   Collection Time   07/19/11  2:16 PM      Component Value  Range Comment   Squamous Epithelial / LPF RARE  RARE     WBC, UA 7-10  <3 (WBC/hpf)    RBC / HPF 3-6  <3 (RBC/hpf)    Bacteria, UA RARE  RARE    BASIC METABOLIC PANEL     Status: Normal   Collection Time   07/19/11  2:20 PM      Component Value Range Comment   Sodium 141  135 - 145 (mEq/L)    Potassium 3.7  3.5 - 5.1 (mEq/L)    Chloride 104  96 - 112 (mEq/L)    CO2 26  19 - 32 (mEq/L)    Glucose, Bld 97  70 - 99 (mg/dL)    BUN 10  6 - 23 (mg/dL)    Creatinine, Ser 1.61  0.50 - 1.10 (mg/dL)    Calcium 9.8  8.4 - 10.5 (mg/dL)    GFR calc non Af Amer >60  >60 (mL/min)    GFR calc Af Amer >60  >60 (mL/min)   CBC     Status: Abnormal   Collection Time   07/19/11  2:20 PM      Component Value Range Comment   WBC 10.2  4.0 - 10.5 (K/uL)    RBC 3.83 (*) 3.87 - 5.11 (MIL/uL)    Hemoglobin 6.4 (*) 12.0 - 15.0 (g/dL)    HCT 09.6 (*) 04.5 - 46.0 (%)    MCV 58.2 (*) 78.0 - 100.0 (fL)    MCH 16.7 (*) 26.0 - 34.0 (pg)    MCHC 28.7 (*) 30.0 -  36.0 (g/dL)    RDW 40.9 (*) 81.1 - 15.5 (%)    Platelets 459 (*) 150 - 400 (K/uL)   DIFFERENTIAL     Status: Abnormal   Collection Time   07/19/11  2:20 PM      Component Value Range Comment   Neutrophils Relative 63  43 - 77 (%)    Neutro Abs 6.4  1.7 - 7.7 (K/uL)    Lymphocytes Relative 20  12 - 46 (%)    Lymphs Abs 2.0  0.7 - 4.0 (K/uL)    Monocytes Relative 16 (*) 3 - 12 (%)    Monocytes Absolute 1.6 (*) 0.1 - 1.0 (K/uL)    Eosinophils Relative 1  0 - 5 (%)    Eosinophils Absolute 0.1  0.0 - 0.7 (K/uL)    Basophils Relative 1  0 - 1 (%)    Basophils Absolute 0.1  0.0 - 0.1 (K/uL)   PRO B NATRIURETIC PEPTIDE     Status: Abnormal   Collection Time   07/19/11  2:20 PM      Component Value Range Comment   BNP, POC 747.3 (*) 0 - 125 (pg/mL)   PROTIME-INR     Status: Abnormal   Collection Time   07/19/11  2:20 PM      Component Value Range Comment   Prothrombin Time 22.6 (*) 11.6 - 15.2 (seconds)    INR 1.95 (*) 0.00 - 1.49    APTT     Status: Abnormal   Collection Time   07/19/11  2:20 PM      Component Value Range Comment   aPTT 40 (*) 24 - 37 (seconds)   RETICULOCYTES     Status: Abnormal   Collection Time   07/19/11  2:20 PM      Component Value Range Comment   Retic Ct Pct 1.3  0.4 - 3.1 (%)    RBC. 3.85 (*)  3.87 - 5.11 (MIL/uL)    Retic Count, Manual 50.1  19.0 - 186.0 (K/uL)   POCT I-STAT TROPONIN I     Status: Normal   Collection Time   07/19/11  2:34 PM      Component Value Range Comment   Troponin i, poc 0.01  0.00 - 0.08 (ng/mL)    Comment 3            TYPE AND SCREEN     Status: Normal (Preliminary result)   Collection Time   07/19/11  3:40 PM      Component Value Range Comment   ABO/RH(D) O POS      Antibody Screen POS      Sample Expiration 07/22/2011      Antibody Identification ANTI-E      PT AG Type NEGATIVE FOR E ANTIGEN      DAT, IgG NEG      Unit Number [id number]      Blood Component Type RED CELLS,LR      Unit division 00      Status of Unit  ISSUED,FINAL      Donor AG Type NEGATIVE FOR E ANTIGEN      Transfusion Status OK TO TRANSFUSE      Crossmatch Result COMPATIBLE      Unit Number [id number]      Blood Component Type RED CELLS,LR      Unit division 00      Status of Unit ALLOCATED      Donor AG Type NEGATIVE FOR E ANTIGEN      Transfusion Status OK TO TRANSFUSE      Crossmatch Result COMPATIBLE     ABO/RH     Status: Normal   Collection Time   07/19/11  3:40 PM      Component Value Range Comment   ABO/RH(D) O POS     PREPARE RBC (CROSSMATCH)     Status: Normal   Collection Time   07/19/11  3:40 PM      Component Value Range Comment   Order Confirmation ORDER PROCESSED BY BLOOD BANK     POCT I-STAT TROPONIN I     Status: Normal   Collection Time   07/19/11  6:28 PM      Component Value Range Comment   Troponin i, poc 0.00  0.00 - 0.08 (ng/mL)    Comment 3            URINALYSIS, ROUTINE W REFLEX MICROSCOPIC     Status: Normal   Collection Time   07/20/11  4:00 AM      Component Value Range Comment   Color, Urine YELLOW  YELLOW     Appearance CLEAR  CLEAR     Specific Gravity, Urine 1.010  1.005 - 1.030     pH 6.5  5.0 - 8.0     Glucose, UA NEGATIVE  NEGATIVE (mg/dL)    Hgb urine dipstick NEGATIVE  NEGATIVE     Bilirubin Urine NEGATIVE  NEGATIVE     Ketones, ur NEGATIVE  NEGATIVE (mg/dL)    Protein, ur NEGATIVE  NEGATIVE (mg/dL)    Urobilinogen, UA 0.2  0.0 - 1.0 (mg/dL)    Nitrite NEGATIVE  NEGATIVE     Leukocytes, UA NEGATIVE  NEGATIVE  MICROSCOPIC NOT DONE ON URINES WITH NEGATIVE PROTEIN, BLOOD, LEUKOCYTES, NITRITE, OR GLUCOSE <1000 mg/dL.  CARDIAC PANEL(CRET KIN+CKTOT+MB+TROPI)     Status: Normal   Collection Time   07/20/11  5:42 AM  Component Value Range Comment   Total CK 52  7 - 177 (U/L)    CK, MB 2.1  0.3 - 4.0 (ng/mL)    Troponin I <0.30  <0.30 (ng/mL)    Relative Index RELATIVE INDEX IS INVALID  0.0 - 2.5    COMPREHENSIVE METABOLIC PANEL     Status: Normal   Collection Time   07/20/11  5:43 AM       Component Value Range Comment   Sodium 144  135 - 145 (mEq/L)    Potassium 3.7  3.5 - 5.1 (mEq/L)    Chloride 103  96 - 112 (mEq/L)    CO2 29  19 - 32 (mEq/L)    Glucose, Bld 89  70 - 99 (mg/dL)    BUN 9  6 - 23 (mg/dL)    Creatinine, Ser 1.61  0.50 - 1.10 (mg/dL)    Calcium 09.6  8.4 - 10.5 (mg/dL)    Total Protein 6.6  6.0 - 8.3 (g/dL)    Albumin 3.8  3.5 - 5.2 (g/dL)    AST 12  0 - 37 (U/L)    ALT 10  0 - 35 (U/L)    Alkaline Phosphatase 82  39 - 117 (U/L)    Total Bilirubin 0.7  0.3 - 1.2 (mg/dL)    GFR calc non Af Amer >60  >60 (mL/min)    GFR calc Af Amer >60  >60 (mL/min)   CBC     Status: Abnormal   Collection Time   07/20/11  5:43 AM      Component Value Range Comment   WBC 11.0 (*) 4.0 - 10.5 (K/uL)    RBC 4.19  3.87 - 5.11 (MIL/uL)    Hemoglobin 7.6 (*) 12.0 - 15.0 (g/dL)    HCT 04.5 (*) 40.9 - 46.0 (%)    MCV 61.3 (*) 78.0 - 100.0 (fL)    MCH 18.1 (*) 26.0 - 34.0 (pg)    MCHC 29.6 (*) 30.0 - 36.0 (g/dL)    RDW 81.1 (*) 91.4 - 15.5 (%)    Platelets 494 (*) 150 - 400 (K/uL)   PROTIME-INR     Status: Abnormal   Collection Time   07/20/11  5:43 AM      Component Value Range Comment   Prothrombin Time 22.4 (*) 11.6 - 15.2 (seconds)    INR 1.93 (*) 0.00 - 1.49    APTT     Status: Normal   Collection Time   07/20/11  5:43 AM      Component Value Range Comment   aPTT 37  24 - 37 (seconds)   TRANSFUSION REACTION     Status: Normal (Preliminary result)   Collection Time   07/20/11  3:40 PM      Component Value Range Comment   Post RXN DAT IgG PENDING      DAT C3 PENDING      Path interp tx rxn PENDING        ROS Blood pressure 149/72, pulse 62, temperature 98.2 F (36.8 C), temperature source Oral, resp. rate 20, height 5' (1.524 m), weight 155 lb (70.308 kg), SpO2 99.00%. Physical Exam Alert. Skin is warm and dry. Conjunctivae pink. Sclera is anicteric. She is edentulous. Thyroid is normal. Lungs are clear. Heart regular,rate, and rhythm. Abdomen soft. BS+. No  hepatomegaly. No masses felt. Her stool was brown in vault.  I did not guaiac.  Large skin tag noted. No edema to extremities.  Assessment/Plan: Ms. Domingo Cocking 76 yr  old female with heme postive stools and anemia.  PUD needs to be ruled. She is on chronic ASA and Warfarin therapy.  When cardiac has been ruled out, she will need an EGD.  I discussed this case with Dr Karilyn Cota.  SETZER,TERRI W 07/20/2011, 8:44 AM

## 2011-07-20 NOTE — Progress Notes (Signed)
Physical Therapy Evaluation Patient Name: Yesenia Woods [id number] Date: 07/20/2011 Problem List:  Patient Active Problem List  Diagnoses  . Anemia  . Dyspnea  . CAD (coronary artery disease)  . HTN (hypertension)  . Anticoagulated on warfarin   Past Medical History:  Past Medical History  Diagnosis Date  . Myocardial infarct   . Cancer     breast   Past Surgical History:  Past Surgical History  Procedure Date  . Coronary artery bypass graft   . Breast surgery   . Eye surgery     Precautions/Restrictions  Precautions Required Braces or Orthoses: No Restrictions Weight Bearing Restrictions: No Prior Functioning  Home Living Type of Home: House Lives With: Alone Receives Help From: Family Home Layout: One level Home Access: Stairs to enter Entrance Stairs-Rails: Right Entrance Stairs-Number of Steps: 2 Bathroom Shower/Tub: Associate Professor: Yes Home Adaptive Equipment: None Prior Function Level of Independence: Independent with basic ADLs Driving: Yes Vocation: Retired Producer, television/film/video: Awake/alert Overall Cognitive Status: Appears within functional limits for tasks assessed Sensation/Coordination Sensation Light Touch: Appears Intact Stereognosis: Appears Intact Proprioception: Appears Intact Coordination Gross Motor Movements are Fluid and Coordinated: Yes Fine Motor Movements are Fluid and Coordinated: Not tested Extremity Assessment   Mobility (including Balance) Bed Mobility Bed Mobility: Yes Rolling Right: 7: Independent Right Sidelying to Sit: 7: Independent Supine to Sit: 7: Independent Sitting - Scoot to Edge of Bed: 7: Independent Transfers Transfers: Yes Sit to Stand: 7: Independent Stand to Sit: 7: Independent Stand Pivot Transfers: 7: Independent Ambulation/Gait Ambulation/Gait: Yes Ambulation/Gait Assistance: 7: Independent Ambulation Distance (Feet): 200  Feet Assistive device: None Gait Pattern: Within Functional Limits Stairs: No Wheelchair Mobility Wheelchair Mobility: No  Posture/Postural Control Posture/Postural Control: No significant limitations Exercise  Total Joint Exercises Long Arc Quad: AROM;Both;10 reps General Exercises - Lower Extremity Long Arc Quad: AROM;Both;10 reps Hip Flexion/Marching: Both;Standing;10 reps Mini-Sqauts: Strengthening;Seated;10 reps  End of Session PT - End of Session Equipment Utilized During Treatment: Gait belt Activity Tolerance: Patient tolerated treatment well Patient left: in bed Nurse Communication: Mobility status for transfers General Behavior During Session: [name] for tasks performed PT Assessment/Plan/Recommendation PT Assessment Clinical Impression Statement: Pt is I in ambulation and bed mobility.  No strength deficits noted PT Recommendation/Assessment: Patent does not need any further PT services No Skilled PT: Patient is independent with all acitivity/mobility PT Goals  Acute Rehab PT Goals PT Goal Formulation: With patient Yesenia Woods 07/20/2011, 9:46 AM

## 2011-07-20 NOTE — Progress Notes (Signed)
Patient developed an urticarial reaction to left chest wall post first unit of blood. This was treated with benadryl. Patient did not have any other systemic symptoms. This was also reported to the blood bank. I discussed with pathologist, Dr. Frederica Kuster, who said that he would like to run tests for hemolytic reaction though this does not sound like one. Since patient is otherwise stable we can hold off on transfusion for now. Once the results of the tests are available, transfusion of the second unit can be pursued at that time, if needed.

## 2011-07-20 NOTE — Progress Notes (Signed)
UR Chart Review Completed  

## 2011-07-21 ENCOUNTER — Inpatient Hospital Stay (HOSPITAL_COMMUNITY): Payer: Medicare Other

## 2011-07-21 LAB — TYPE AND SCREEN
DAT, IgG: NEGATIVE
Donor AG Type: NEGATIVE
PT AG Type: NEGATIVE
Unit division: 0
Unit division: 0

## 2011-07-21 LAB — CBC
HCT: 31 % — ABNORMAL LOW (ref 36.0–46.0)
Hemoglobin: 9.4 g/dL — ABNORMAL LOW (ref 12.0–15.0)
MCH: 19.6 pg — ABNORMAL LOW (ref 26.0–34.0)
MCHC: 30.3 g/dL (ref 30.0–36.0)
RBC: 4.79 MIL/uL (ref 3.87–5.11)

## 2011-07-21 LAB — CARDIAC PANEL(CRET KIN+CKTOT+MB+TROPI): CK, MB: 2.2 ng/mL (ref 0.3–4.0)

## 2011-07-21 LAB — BASIC METABOLIC PANEL
BUN: 9 mg/dL (ref 6–23)
CO2: 30 mEq/L (ref 19–32)
Chloride: 102 mEq/L (ref 96–112)
GFR calc non Af Amer: >60 mL/min (ref >60)
Glucose, Bld: 91 mg/dL (ref 70–99)
Potassium: 3 mEq/L — ABNORMAL LOW (ref 3.5–5.1)
Sodium: 139 mEq/L (ref 135–145)

## 2011-07-21 LAB — PROTIME-INR: Prothrombin Time: 20.7 seconds — ABNORMAL HIGH (ref 11.6–15.2)

## 2011-07-21 MED ORDER — POTASSIUM CHLORIDE CRYS ER 20 MEQ PO TBCR
40.0000 meq | EXTENDED_RELEASE_TABLET | Freq: Once | ORAL | Status: AC
  Administered 2011-07-21: 40 meq via ORAL
  Filled 2011-07-21: qty 2

## 2011-07-21 MED ORDER — PEG 3350-KCL-NABCB-NACL-NASULF 236 G PO SOLR
4000.0000 mL | Freq: Once | ORAL | Status: AC
  Administered 2011-07-21: 4000 mL via ORAL
  Filled 2011-07-21: qty 4000

## 2011-07-21 NOTE — Progress Notes (Signed)
I talked with Dr. Doristine Section; Patient is cleared for endoscopic evaluation. INR 1.74 today. H/H 9.4/31.0 Iron studies reviewed; consistent with IDA. Will proceed with EGD and TCS in am. Patient agreeable.

## 2011-07-21 NOTE — Progress Notes (Signed)
Subjective: This very pleasant 76 year old lady was admitted with severe anemia and dyspnea. Her chest x-ray seemed to imply congestive heart failure, likely diastolic. She has been given 3 units of blood and feels better. Fecal occult blood was positive. Gastroenterology feels that she needs upper and lower endoscopy.           Physical Exam: Blood pressure 154/67, pulse 71, temperature 98.4 F (36.9 C), temperature source Oral, resp. rate 20, height 5' (1.524 m), weight 70.308 kg (155 lb), SpO2 98.00%. She looks systemically well. She does look somewhat pale. There is no increased work of breathing. Heart sounds are present and normal. Lung fields are entirely clear without any crackles whatsoever. Abdomen is soft and nontender. She is alert and orientated without any focal neurological signs.   Investigations: Results for orders placed during the hospital encounter of 07/19/11 (from the past 48 hour(s))  URINE CULTURE     Status: Normal   Collection Time   07/19/11  2:16 PM      Component Value Range Comment   Specimen Description URINE, CATHETERIZED      Special Requests NONE      Setup Time [id number]      Colony Count NO GROWTH      Culture NO GROWTH      Report Status 07/20/2011 FINAL     URINALYSIS, ROUTINE W REFLEX MICROSCOPIC     Status: Abnormal   Collection Time   07/19/11  2:16 PM      Component Value Range Comment   Color, Urine YELLOW  YELLOW     Appearance HAZY (*) CLEAR     Specific Gravity, Urine 1.010  1.005 - 1.030     pH 6.5  5.0 - 8.0     Glucose, UA NEGATIVE  NEGATIVE (mg/dL)    Hgb urine dipstick TRACE (*) NEGATIVE     Bilirubin Urine NEGATIVE  NEGATIVE     Ketones, ur NEGATIVE  NEGATIVE (mg/dL)    Protein, ur NEGATIVE  NEGATIVE (mg/dL)    Urobilinogen, UA 4.0 (*) 0.0 - 1.0 (mg/dL)    Nitrite NEGATIVE  NEGATIVE     Leukocytes, UA SMALL (*) NEGATIVE    URINE MICROSCOPIC-ADD ON     Status: Normal   Collection Time   07/19/11  2:16 PM      Component  Value Range Comment   Squamous Epithelial / LPF RARE  RARE     WBC, UA 7-10  <3 (WBC/hpf)    RBC / HPF 3-6  <3 (RBC/hpf)    Bacteria, UA RARE  RARE    BASIC METABOLIC PANEL     Status: Normal   Collection Time   07/19/11  2:20 PM      Component Value Range Comment   Sodium 141  135 - 145 (mEq/L)    Potassium 3.7  3.5 - 5.1 (mEq/L)    Chloride 104  96 - 112 (mEq/L)    CO2 26  19 - 32 (mEq/L)    Glucose, Bld 97  70 - 99 (mg/dL)    BUN 10  6 - 23 (mg/dL)    Creatinine, Ser 7.82  0.50 - 1.10 (mg/dL)    Calcium 9.8  8.4 - 10.5 (mg/dL)    GFR calc non Af Amer >60  >60 (mL/min)    GFR calc Af Amer >60  >60 (mL/min)   CBC     Status: Abnormal   Collection Time   07/19/11  2:20 PM  Component Value Range Comment   WBC 10.2  4.0 - 10.5 (K/uL)    RBC 3.83 (*) 3.87 - 5.11 (MIL/uL)    Hemoglobin 6.4 (*) 12.0 - 15.0 (g/dL)    HCT 16.1 (*) 09.6 - 46.0 (%)    MCV 58.2 (*) 78.0 - 100.0 (fL)    MCH 16.7 (*) 26.0 - 34.0 (pg)    MCHC 28.7 (*) 30.0 - 36.0 (g/dL)    RDW 04.5 (*) 40.9 - 15.5 (%)    Platelets 459 (*) 150 - 400 (K/uL)   DIFFERENTIAL     Status: Abnormal   Collection Time   07/19/11  2:20 PM      Component Value Range Comment   Neutrophils Relative 63  43 - 77 (%)    Neutro Abs 6.4  1.7 - 7.7 (K/uL)    Lymphocytes Relative 20  12 - 46 (%)    Lymphs Abs 2.0  0.7 - 4.0 (K/uL)    Monocytes Relative 16 (*) 3 - 12 (%)    Monocytes Absolute 1.6 (*) 0.1 - 1.0 (K/uL)    Eosinophils Relative 1  0 - 5 (%)    Eosinophils Absolute 0.1  0.0 - 0.7 (K/uL)    Basophils Relative 1  0 - 1 (%)    Basophils Absolute 0.1  0.0 - 0.1 (K/uL)   PRO B NATRIURETIC PEPTIDE     Status: Abnormal   Collection Time   07/19/11  2:20 PM      Component Value Range Comment   BNP, POC 747.3 (*) 0 - 125 (pg/mL)   PROTIME-INR     Status: Abnormal   Collection Time   07/19/11  2:20 PM      Component Value Range Comment   Prothrombin Time 22.6 (*) 11.6 - 15.2 (seconds)    INR 1.95 (*) 0.00 - 1.49    APTT     Status:  Abnormal   Collection Time   07/19/11  2:20 PM      Component Value Range Comment   aPTT 40 (*) 24 - 37 (seconds)   VITAMIN B12     Status: Normal   Collection Time   07/19/11  2:20 PM      Component Value Range Comment   Vitamin B-12 356  211 - 911 (pg/mL)   FOLATE     Status: Normal   Collection Time   07/19/11  2:20 PM      Component Value Range Comment   Folate 17.8     IRON AND TIBC     Status: Abnormal   Collection Time   07/19/11  2:20 PM      Component Value Range Comment   Iron 11 (*) 42 - 135 (ug/dL)    TIBC 811  914 - 782 (ug/dL)    Saturation Ratios 2 (*) 20 - 55 (%)    UIBC 443 (*) 125 - 400 (ug/dL)   FERRITIN     Status: Abnormal   Collection Time   07/19/11  2:20 PM      Component Value Range Comment   Ferritin 7 (*) 10 - 291 (ng/mL)   RETICULOCYTES     Status: Abnormal   Collection Time   07/19/11  2:20 PM      Component Value Range Comment   Retic Ct Pct 1.3  0.4 - 3.1 (%)    RBC. 3.85 (*) 3.87 - 5.11 (MIL/uL)    Retic Count, Manual 50.1  19.0 - 186.0 (K/uL)  POCT I-STAT TROPONIN I     Status: Normal   Collection Time   07/19/11  2:34 PM      Component Value Range Comment   Troponin i, poc 0.01  0.00 - 0.08 (ng/mL)    Comment 3            TYPE AND SCREEN     Status: Normal   Collection Time   07/19/11  3:40 PM      Component Value Range Comment   ABO/RH(D) O POS      Antibody Screen POS      Sample Expiration 07/22/2011      Antibody Identification ANTI-E      PT AG Type NEGATIVE FOR E ANTIGEN      DAT, IgG NEG      Unit Number [id number]      Blood Component Type RED CELLS,LR      Unit division 00      Status of Unit ISSUED,FINAL      Donor AG Type NEGATIVE FOR E ANTIGEN      Transfusion Status OK TO TRANSFUSE      Crossmatch Result COMPATIBLE      Unit Number [id number]      Blood Component Type RED CELLS,LR      Unit division 00      Status of Unit ISSUED,FINAL      Donor AG Type NEGATIVE FOR E ANTIGEN      Transfusion Status OK TO TRANSFUSE       Crossmatch Result COMPATIBLE     ABO/RH     Status: Normal   Collection Time   07/19/11  3:40 PM      Component Value Range Comment   ABO/RH(D) O POS     PREPARE RBC (CROSSMATCH)     Status: Normal   Collection Time   07/19/11  3:40 PM      Component Value Range Comment   Order Confirmation ORDER PROCESSED BY BLOOD BANK     POCT I-STAT TROPONIN I     Status: Normal   Collection Time   07/19/11  6:28 PM      Component Value Range Comment   Troponin i, poc 0.00  0.00 - 0.08 (ng/mL)    Comment 3            TSH     Status: Normal   Collection Time   07/19/11  8:30 PM      Component Value Range Comment   TSH 1.212  0.350 - 4.500 (uIU/mL)   HEMOGLOBIN A1C     Status: Abnormal   Collection Time   07/19/11  8:30 PM      Component Value Range Comment   Hemoglobin A1C 6.2 (*) <5.7 (%)    Mean Plasma Glucose 131 (*) <117 (mg/dL)   TRANSFUSION REACTION     Status: Normal   Collection Time   07/20/11  3:45 AM      Component Value Range Comment   Post RXN DAT IgG NOT NEEDED      DAT C3 NEG      Path interp tx rxn        Value: NO EVIDENCE OF A HEMOLYTIC TRANSFUSION REACTION, PER DR. RUND  URINALYSIS, ROUTINE W REFLEX MICROSCOPIC     Status: Normal   Collection Time   07/20/11  4:00 AM      Component Value Range Comment   Color, Urine YELLOW  YELLOW     Appearance CLEAR  CLEAR  Specific Gravity, Urine 1.010  1.005 - 1.030     pH 6.5  5.0 - 8.0     Glucose, UA NEGATIVE  NEGATIVE (mg/dL)    Hgb urine dipstick NEGATIVE  NEGATIVE     Bilirubin Urine NEGATIVE  NEGATIVE     Ketones, ur NEGATIVE  NEGATIVE (mg/dL)    Protein, ur NEGATIVE  NEGATIVE (mg/dL)    Urobilinogen, UA 0.2  0.0 - 1.0 (mg/dL)    Nitrite NEGATIVE  NEGATIVE     Leukocytes, UA NEGATIVE  NEGATIVE  MICROSCOPIC NOT DONE ON URINES WITH NEGATIVE PROTEIN, BLOOD, LEUKOCYTES, NITRITE, OR GLUCOSE <1000 mg/dL.  CARDIAC PANEL(CRET KIN+CKTOT+MB+TROPI)     Status: Normal   Collection Time   07/20/11  5:42 AM      Component Value Range  Comment   Total CK 52  7 - 177 (U/L)    CK, MB 2.1  0.3 - 4.0 (ng/mL)    Troponin I <0.30  <0.30 (ng/mL)    Relative Index RELATIVE INDEX IS INVALID  0.0 - 2.5    COMPREHENSIVE METABOLIC PANEL     Status: Normal   Collection Time   07/20/11  5:43 AM      Component Value Range Comment   Sodium 144  135 - 145 (mEq/L)    Potassium 3.7  3.5 - 5.1 (mEq/L)    Chloride 103  96 - 112 (mEq/L)    CO2 29  19 - 32 (mEq/L)    Glucose, Bld 89  70 - 99 (mg/dL)    BUN 9  6 - 23 (mg/dL)    Creatinine, Ser 9.52  0.50 - 1.10 (mg/dL)    Calcium 84.1  8.4 - 10.5 (mg/dL)    Total Protein 6.6  6.0 - 8.3 (g/dL)    Albumin 3.8  3.5 - 5.2 (g/dL)    AST 12  0 - 37 (U/L)    ALT 10  0 - 35 (U/L)    Alkaline Phosphatase 82  39 - 117 (U/L)    Total Bilirubin 0.7  0.3 - 1.2 (mg/dL)    GFR calc non Af Amer >60  >60 (mL/min)    GFR calc Af Amer >60  >60 (mL/min)   CBC     Status: Abnormal   Collection Time   07/20/11  5:43 AM      Component Value Range Comment   WBC 11.0 (*) 4.0 - 10.5 (K/uL)    RBC 4.19  3.87 - 5.11 (MIL/uL)    Hemoglobin 7.6 (*) 12.0 - 15.0 (g/dL)    HCT 32.4 (*) 40.1 - 46.0 (%)    MCV 61.3 (*) 78.0 - 100.0 (fL)    MCH 18.1 (*) 26.0 - 34.0 (pg)    MCHC 29.6 (*) 30.0 - 36.0 (g/dL)    RDW 02.7 (*) 25.3 - 15.5 (%)    Platelets 494 (*) 150 - 400 (K/uL)   PROTIME-INR     Status: Abnormal   Collection Time   07/20/11  5:43 AM      Component Value Range Comment   Prothrombin Time 22.4 (*) 11.6 - 15.2 (seconds)    INR 1.93 (*) 0.00 - 1.49    APTT     Status: Normal   Collection Time   07/20/11  5:43 AM      Component Value Range Comment   aPTT 37  24 - 37 (seconds)   CBC     Status: Abnormal   Collection Time   07/20/11 10:55 AM  Component Value Range Comment   WBC 10.5  4.0 - 10.5 (K/uL)    RBC 4.34  3.87 - 5.11 (MIL/uL)    Hemoglobin 7.9 (*) 12.0 - 15.0 (g/dL)    HCT 04.5 (*) 40.9 - 46.0 (%)    MCV 60.8 (*) 78.0 - 100.0 (fL)    MCH 18.2 (*) 26.0 - 34.0 (pg)    MCHC 29.9 (*) 30.0 -  36.0 (g/dL)    RDW 81.1 (*) 91.4 - 15.5 (%)    Platelets 463 (*) 150 - 400 (K/uL)   CARDIAC PANEL(CRET KIN+CKTOT+MB+TROPI)     Status: Normal   Collection Time   07/20/11 11:09 AM      Component Value Range Comment   Total CK 49  7 - 177 (U/L)    CK, MB 2.1  0.3 - 4.0 (ng/mL)    Troponin I <0.30  <0.30 (ng/mL)    Relative Index RELATIVE INDEX IS INVALID  0.0 - 2.5    CARDIAC PANEL(CRET KIN+CKTOT+MB+TROPI)     Status: Normal   Collection Time   07/20/11  3:57 PM      Component Value Range Comment   Total CK 58  7 - 177 (U/L)    CK, MB 2.3  0.3 - 4.0 (ng/mL)    Troponin I <0.30  <0.30 (ng/mL)    Relative Index RELATIVE INDEX IS INVALID  0.0 - 2.5    CARDIAC PANEL(CRET KIN+CKTOT+MB+TROPI)     Status: Normal   Collection Time   07/21/11 12:07 AM      Component Value Range Comment   Total CK 61  7 - 177 (U/L)    CK, MB 2.2  0.3 - 4.0 (ng/mL)    Troponin I <0.30  <0.30 (ng/mL)    Relative Index RELATIVE INDEX IS INVALID  0.0 - 2.5    BASIC METABOLIC PANEL     Status: Abnormal   Collection Time   07/21/11  6:04 AM      Component Value Range Comment   Sodium 139  135 - 145 (mEq/L)    Potassium 3.0 (*) 3.5 - 5.1 (mEq/L) DELTA CHECK NOTED   Chloride 102  96 - 112 (mEq/L)    CO2 30  19 - 32 (mEq/L)    Glucose, Bld 91  70 - 99 (mg/dL)    BUN 9  6 - 23 (mg/dL)    Creatinine, Ser 7.82  0.50 - 1.10 (mg/dL)    Calcium 9.0  8.4 - 10.5 (mg/dL)    GFR calc non Af Amer >60  >60 (mL/min)    GFR calc Af Amer >60  >60 (mL/min)   CBC     Status: Abnormal   Collection Time   07/21/11  6:04 AM      Component Value Range Comment   WBC 11.9 (*) 4.0 - 10.5 (K/uL)    RBC 4.79  3.87 - 5.11 (MIL/uL)    Hemoglobin 9.4 (*) 12.0 - 15.0 (g/dL)    HCT 95.6 (*) 21.3 - 46.0 (%)    MCV 64.7 (*) 78.0 - 100.0 (fL)    MCH 19.6 (*) 26.0 - 34.0 (pg)    MCHC 30.3  30.0 - 36.0 (g/dL)    RDW 08.6 (*) 57.8 - 15.5 (%)    Platelets 431 (*) 150 - 400 (K/uL)   PROTIME-INR     Status: Abnormal   Collection Time   07/21/11   9:13 AM      Component Value Range Comment   Prothrombin Time  20.7 (*) 11.6 - 15.2 (seconds)    INR 1.74 (*) 0.00 - 1.49     Recent Results (from the past 240 hour(s))  URINE CULTURE     Status: Normal   Collection Time   07/19/11  2:16 PM      Component Value Range Status Comment   Specimen Description URINE, CATHETERIZED   Final    Special Requests NONE   Final    Setup Time [id number]   Final    Colony Count NO GROWTH   Final    Culture NO GROWTH   Final    Report Status 07/20/2011 FINAL   Final     Dg Chest 2 View  07/19/2011  *RADIOLOGY REPORT*  Clinical Data: Chest pain and shortness of breath.  CHEST - 2 VIEW  Comparison: None.  Findings: Trachea is midline.  Heart is at the upper limits of normal in size.  Peripheral septal lines are seen in the lung bases.  Bibasilar atelectasis and/or scarring.  No definite pleural fluid.  IMPRESSION: Early basilar dependent interstitial pulmonary edema.  Original Report Authenticated By: Reyes Ivan, M.D.      2D Echocardiogram      Ordering Physician:  Osvaldo Shipper  Order# [id number]  Study Date:  07/21/11      Patient Information      Name  MRN   Description     Yesenia Woods  [id number]   76 year old Female              Result Narrative     *[facility]* [address] [place], [place] [address] [phone]  -------------------------------------------------------------------- Transthoracic Echocardiography  Patient: Cadie, Sorci MR #: [id number] Study Date: 07/20/2011 Gender: F Age: 69 Height: 152.4cm Weight: 70.3kg BSA: 1.30m^2 Pt. Status: Room: A314  SONOGRAPHER Karrie Doffing ADMITTING Osvaldo Shipper Pieter Partridge, Gokul PERFORMING Delma Freeze Penn ATTENDING Mcmanus, Mathis Dad, G cc:  -------------------------------------------------------------------- LV EF: 50% - 55%  -------------------------------------------------------------------- Indications: 518.4  Pulmonary Edema, Acute.  -------------------------------------------------------------------- History: PMH: Coronary artery disease. PMH: anemia, former smoker Risk factors: Hypertension.  -------------------------------------------------------------------- Study Conclusions  - Left ventricle: The cavity size was normal. There was mild concentric hypertrophy. Systolic function was normal. The estimated ejection fraction was in the range of 50% to 55%. Wall motion was normal; there were no regional wall motion abnormalities. - Aortic valve: Mildly calcified annulus. Trileaflet; normal thickness leaflets. Trivial regurgitation. - Mitral valve: Calcified annulus. Mild to moderate regurgitation. - Left atrium: The atrium was mildly dilated. - Right atrium: The atrium was mildly dilated. - Atrial septum: No defect or patent foramen ovale was identified. - Pulmonary arteries: Systolic pressure was mildly increased. PA peak pressure: 49mm Hg (S). Transthoracic echocardiography. M-mode, complete 2D, spectral Doppler, and color Doppler. Height: Height: 152.4cm. Height: 60in. Weight: Weight: 70.3kg. Weight: 154.7lb. Body mass index: BMI: 30.3kg/m^2. Body surface area: BSA: 1.79m^2. Patient status: Inpatient. Location: Bedside.        Medications: I have reviewed the patient's current medications.  Impression: 1. Severe anemia with Hemoccult stool positive. Status post blood transfusion. 2. Possible congestive heart failure. Normal ejection fraction and mild left ventricular hypertrophy. 3. Hypertension. 4. Chronic anticoagulation.     Plan: 1. Repeat chest x-ray today. 2. Monitor hemoglobin. 3. Stable for upper and/or lower GI endoscopy.     LOS: 2 days   Brylin Stanislawski C 07/21/2011, 10:30 AM

## 2011-07-21 NOTE — Progress Notes (Signed)
Subjective: Since I last evaluated the patient  Remains alert.  Appetite is good.  H and H today 9.4 and 31.0. Vitals are stable. Guaiac positive in the ED on admission.    Objective:  Vital signs in last 24 hours: Temp:  [97.8 F (36.6 C)-99 F (37.2 C)] 98.4 F (36.9 C) (09/05 0700) Pulse Rate:  [51-85] 51  (09/05 0700) Resp:  [16-20] 20  (09/05 0700) BP: (132-169)/(62-90) 154/67 mmHg (09/05 0700) SpO2:  [94 %-99 %] 98 % (09/05 0700) FiO2 (%):  [2 %] 2 % (09/05 0700) Last BM Date: 07/19/11    Intake/Output from previous day: 09/04 0701 - 09/05 0700 In: 3453 [P.O.:2270; I.V.:500; Blood:675; IV Piggyback:8] Out: 2050 [Urine:2050] Intake/Output this shift:    General appearance: alert, cooperative and no distress  Lungs are clear. Abdomen is soft. Bowel sounds are positive.   Lab Results:  Basename 07/21/11 0604 07/20/11 1055 07/20/11 0543  WBC 11.9* 10.5 11.0*  HGB 9.4* 7.9* 7.6*  HCT 31.0* 26.4* 25.7*  PLT 431* 463* 494*   BMET  Basename 07/21/11 0604 07/20/11 0543 07/19/11 1420  NA 139 144 141  K 3.0* 3.7 3.7  CL 102 103 104  CO2 30 29 26   GLUCOSE 91 89 97  BUN 9 9 10   CREATININE 0.66 0.75 0.69  CALCIUM 9.0 10.0 9.8   LFT  Basename 07/20/11 0543  PROT 6.6  ALBUMIN 3.8  AST 12  ALT 10  ALKPHOS 82  BILITOT 0.7  BILIDIR --  IBILI --   PT/INR  Basename 07/20/11 0543 07/19/11 1420  LABPROT 22.4* 22.6*  INR 1.93* 1.95*   Hepatitis Panel No results found for this basename: HEPBSAG,HCVAB,HEPAIGM,HEPBIGM in the last 72 hours C-Diff No results found for this basename: CDIFFTOX:3 in the last 72 hours Fecal Lactopherrin No results found for this basename: FECLLACTOFRN in the last 72 hours  Studies/Results: Dg Chest 2 View  07/19/2011  *RADIOLOGY REPORT*  Clinical Data: Chest pain and shortness of breath.  CHEST - 2 VIEW  Comparison: None.  Findings: Trachea is midline.  Heart is at the upper limits of normal in size.  Peripheral septal lines are seen in  the lung bases.  Bibasilar atelectasis and/or scarring.  No definite pleural fluid.  IMPRESSION: Early basilar dependent interstitial pulmonary edema.  Original Report Authenticated By: Reyes Ivan, M.D.    Medications: I have reviewed the patient's current medications.  Assessment/Plan GI bleed. PUD needs to be ruled out given hx of ASA and coumadin therapy. Ms. Lesko will also need a colonoscopy to rule out colonic carcinoma. Plan:  When medical clear, will schedule an EGD/colonoscopy with Dr. Karilyn Cota. I have spoken to Dr. Karilyn Cota concerning this.  Will also get a PT/INR on her for today.   LOS: 2 days   SETZER,TERRI W 07/21/2011, 8:43 AM

## 2011-07-22 ENCOUNTER — Other Ambulatory Visit (INDEPENDENT_AMBULATORY_CARE_PROVIDER_SITE_OTHER): Payer: Self-pay | Admitting: Internal Medicine

## 2011-07-22 ENCOUNTER — Encounter (HOSPITAL_COMMUNITY)
Admission: EM | Disposition: A | Discharge status: Home / Self Care | Payer: Self-pay | Source: Home / Self Care | Attending: Internal Medicine

## 2011-07-22 ENCOUNTER — Encounter (HOSPITAL_COMMUNITY): Payer: Self-pay | Admitting: *Deleted

## 2011-07-22 DIAGNOSIS — D126 Benign neoplasm of colon, unspecified: Secondary | ICD-10-CM

## 2011-07-22 DIAGNOSIS — K921 Melena: Secondary | ICD-10-CM

## 2011-07-22 DIAGNOSIS — D509 Iron deficiency anemia, unspecified: Secondary | ICD-10-CM

## 2011-07-22 HISTORY — PX: ESOPHAGOGASTRODUODENOSCOPY: SHX5428

## 2011-07-22 HISTORY — PX: COLONOSCOPY: SHX5424

## 2011-07-22 LAB — COMPREHENSIVE METABOLIC PANEL
ALT: 8 U/L (ref 0–35)
Alkaline Phosphatase: 82 U/L (ref 39–117)
BUN: 8 mg/dL (ref 6–23)
CO2: 28 mEq/L (ref 19–32)
Calcium: 10 mg/dL (ref 8.4–10.5)
GFR calc Af Amer: >60 mL/min (ref >60)
GFR calc non Af Amer: >60 mL/min (ref >60)
Glucose, Bld: 90 mg/dL (ref 70–99)
Potassium: 3.5 mEq/L (ref 3.5–5.1)
Sodium: 140 mEq/L (ref 135–145)
Total Protein: 7.1 g/dL (ref 6.0–8.3)

## 2011-07-22 LAB — CBC
HCT: 34.1 % — ABNORMAL LOW (ref 36.0–46.0)
Hemoglobin: 10.3 g/dL — ABNORMAL LOW (ref 12.0–15.0)
MCH: 19.4 pg — ABNORMAL LOW (ref 26.0–34.0)
MCHC: 30.2 g/dL (ref 30.0–36.0)
RBC: 5.31 MIL/uL — ABNORMAL HIGH (ref 3.87–5.11)

## 2011-07-22 LAB — PROTIME-INR: Prothrombin Time: 21 seconds — ABNORMAL HIGH (ref 11.6–15.2)

## 2011-07-22 LAB — HAPTOGLOBIN: Haptoglobin: 169 mg/dL (ref 30–200)

## 2011-07-22 SURGERY — EGD (ESOPHAGOGASTRODUODENOSCOPY)
Anesthesia: Moderate Sedation

## 2011-07-22 MED ORDER — MEPERIDINE HCL 50 MG/ML IJ SOLN
INTRAMUSCULAR | Status: AC
  Filled 2011-07-22: qty 1

## 2011-07-22 MED ORDER — SODIUM CHLORIDE 0.45 % IV SOLN
Freq: Once | INTRAVENOUS | Status: AC
  Administered 2011-07-22: 10:00:00 via INTRAVENOUS

## 2011-07-22 MED ORDER — MEPERIDINE HCL 25 MG/ML IJ SOLN
INTRAMUSCULAR | Status: DC | PRN
  Administered 2011-07-22 (×2): 25 mg via INTRAVENOUS

## 2011-07-22 MED ORDER — MIDAZOLAM HCL 5 MG/5ML IJ SOLN
INTRAMUSCULAR | Status: DC | PRN
  Administered 2011-07-22: 1 mg via INTRAVENOUS
  Administered 2011-07-22 (×2): 2 mg via INTRAVENOUS

## 2011-07-22 MED ORDER — MIDAZOLAM HCL 5 MG/5ML IJ SOLN
INTRAMUSCULAR | Status: AC
  Filled 2011-07-22: qty 5

## 2011-07-22 MED ORDER — BUTAMBEN-TETRACAINE-BENZOCAINE 2-2-14 % EX AERO
INHALATION_SPRAY | CUTANEOUS | Status: DC | PRN
  Administered 2011-07-22: 2 via TOPICAL

## 2011-07-22 MED ORDER — PANTOPRAZOLE SODIUM 40 MG PO TBEC
40.0000 mg | DELAYED_RELEASE_TABLET | Freq: Every day | ORAL | Status: DC
  Administered 2011-07-22: 40 mg via ORAL
  Filled 2011-07-22: qty 1

## 2011-07-22 MED ORDER — VITAMIN K1 10 MG/ML IJ SOLN
2.0000 mg | Freq: Once | INTRAMUSCULAR | Status: AC
  Administered 2011-07-22: 2 mg via SUBCUTANEOUS
  Filled 2011-07-22: qty 1

## 2011-07-22 NOTE — Op Note (Signed)
Yesenia Woods, Yesenia Woods              ACCOUNT NO.:  [id number]  MEDICAL RECORD NO.:  [id number]  LOCATION:  A314                          FACILITY:  [facility]  PHYSICIAN:  Lionel December, M.D.    DATE OF BIRTH:  10-06-1935  DATE OF PROCEDURE:  07/22/2011 DATE OF DISCHARGE:                              OPERATIVE REPORT   PROCEDURE:  Esophagogastroduodenoscopy followed by colonoscopy with snare polypectomy.  INDICATIONS:  Ms. Deats is a 76 year old African American female with known coronary artery disease, who presented with chest pain and shortness of breath.  She is ruled out for myocardial infarct.  Her shortness of breath has improved with transfusion.  She was found to be anemic and on studies confirmed to have iron-deficiency anemia.  Her stool is heme-positive.  She is on ASA 325 mg daily in addition to Coumadin, but she has not experienced any GI symptoms.  She is undergoing diagnostic EGD.  Procedure risks were reviewed with the patient and informed consent was obtained.  The patient's INR this morning was 1.74.  She was given 2 mg of vitamin K.  MEDICATIONS FOR CONSCIOUS SEDATION: 1. Cetacaine spray for pharyngeal topical anesthesia. 2. Demerol 50 mg IV. 3. Versed 5 mg IV in divided dose.  FINDINGS:  Procedures performed in endoscopy suite.  The patient's vital signs and O2 sats were monitored during the procedure and remained stable.  PROCEDURES: 1. Esophagogastroduodenoscopy.  The patient was placed in left lateral     recumbent position and Pentax videoscope was passed through     oropharynx without any difficulty into esophagus. 2. Esophagus.  Mucosa of the esophagus normal.  GE junction was     somewhat wavy located at 37-cm from the incisors. 3. Stomach.  It was empty and distended very well with insufflation.     Folds of the proximal stomach are normal.  Examination of mucosa at     body, antrum, pyloric channel as well as angularis, fundus and     cardia was  normal. 4. Duodenum.  Bulbar mucosa was normal.  Scope was passed into second     part of the duodenal mucosa and folds were normal.  Endoscope was     withdrawn and the patient was prepared for procedure #2. 5. Colonoscopy.  Rectal examination performed.  No abnormality noted     on external or digital exam.  Pentax videoscope was placed through     rectum and advanced under vision into sigmoid colon beyond.     Preparation was excellent.  Scope was passed into cecum which was     identified by appendiceal orifice and ileocecal valve.  Across on     the ileocecal valve was Y-shaped sessile polyp about 10-11 mm long.     This was elevated with saline injection and polypectomy performed.     Most of the polyp was snared in 2 attempts and the residual polyp     on the left edge was coagulated with snare tip.  No bleeding was     noted during this procedure.  Pictures were taken for the record.     Both of these pieces were retrieved for a sludge examination.  As     the scope was withdrawn, rest of the colonic mucosa was carefully     examined and was normal.  Rectal mucosa similarly was normal.     Scope was retroflexed to examine anorectal junction and hemorrhoids     noted below the dentate line along with a single anal papilla.     Pictures were taken for the record.  Endoscope was straightened and     withdrawn.  Withdrawal time was 21 minutes.  The patient tolerated the procedures well.  FINAL DIAGNOSES: 1. Normal Esophagogastroduodenoscopy. 2. 11-12 mm sessile polyp snared piecemeal from cecum with saline     assist. Polypectomy was complete. 4. External hemorrhoids and single anal papilla. 5. No bleeding lesion identified, however.  RECOMMENDATIONS: 1. 4 g sodium diet. 2. Coumadin can be resumed in a.m. and aspirin can be resumed on day     5.  If feasible, consider dropping the dose to 81 mg daily. 3. We will schedule her for small bowel given capsule study next week     on  an outpatient basis.          ______________________________ Lionel December, M.D.     NR/MEDQ  D:  07/22/2011  T:  07/22/2011  Job:  [id number]

## 2011-07-22 NOTE — Brief Op Note (Signed)
07/19/2011 - 07/22/2011  11:00 AM  PATIENT:  Yesenia Woods  76 y.o. female  PRE-OPERATIVE DIAGNOSIS:  IDa; heme positive stools.  POST-OPERATIVE DIAGNOSIS:  GE junction @ 37; cecal polyp; anal papilla & external hemorrhoids  PROCEDURE:  Procedure(s): ESOPHAGOGASTRODUODENOSCOPY (EGD) COLONOSCOPY  SURGEON:  Surgeon(s): Malissa Hippo, MD   EGD and colonoscopy note. EGD is normal. Y. shaped sessile polyp at cecum was elevated with saline and piecemeal polypectomy formed. External hemorrhoids and a single anal papilla No bleeding lesion noted

## 2011-07-22 NOTE — Progress Notes (Signed)
Subjective: This very pleasant 76 year old lady was admitted with severe anemia and dyspnea. Her chest x-ray seemed to imply congestive heart failure, likely diastolic. She has been given 3 units of blood and feels better. Fecal occult blood was positive. She is due to have an endoscopy this morning. Her chest x-ray yesterday showed complete clearing of possible interstitial edema. She denies any dyspnea now.           Physical Exam: Blood pressure 177/86, pulse 71, temperature 98.4 F (36.9 C), temperature source Oral, resp. rate 20, height 5' (1.524 m), weight 70.308 kg (155 lb), SpO2 96.00%. She looks systemically well. There is no increased work of breathing. Heart sounds are present and normal. Lung fields are entirely clear without any crackles whatsoever. Abdomen is soft and nontender. She is alert and orientated without any focal neurological signs.   Investigations: Results for orders placed during the hospital encounter of 07/19/11 (from the past 48 hour(s))  CBC     Status: Abnormal   Collection Time   07/20/11 10:55 AM      Component Value Range Comment   WBC 10.5  4.0 - 10.5 (K/uL)    RBC 4.34  3.87 - 5.11 (MIL/uL)    Hemoglobin 7.9 (*) 12.0 - 15.0 (g/dL)    HCT 16.1 (*) 09.6 - 46.0 (%)    MCV 60.8 (*) 78.0 - 100.0 (fL)    MCH 18.2 (*) 26.0 - 34.0 (pg)    MCHC 29.9 (*) 30.0 - 36.0 (g/dL)    RDW 04.5 (*) 40.9 - 15.5 (%)    Platelets 463 (*) 150 - 400 (K/uL)   CARDIAC PANEL(CRET KIN+CKTOT+MB+TROPI)     Status: Normal   Collection Time   07/20/11 11:09 AM      Component Value Range Comment   Total CK 49  7 - 177 (U/L)    CK, MB 2.1  0.3 - 4.0 (ng/mL)    Troponin I <0.30  <0.30 (ng/mL)    Relative Index RELATIVE INDEX IS INVALID  0.0 - 2.5    CARDIAC PANEL(CRET KIN+CKTOT+MB+TROPI)     Status: Normal   Collection Time   07/20/11  3:57 PM      Component Value Range Comment   Total CK 58  7 - 177 (U/L)    CK, MB 2.3  0.3 - 4.0 (ng/mL)    Troponin I <0.30  <0.30 (ng/mL)     Relative Index RELATIVE INDEX IS INVALID  0.0 - 2.5    CARDIAC PANEL(CRET KIN+CKTOT+MB+TROPI)     Status: Normal   Collection Time   07/21/11 12:07 AM      Component Value Range Comment   Total CK 61  7 - 177 (U/L)    CK, MB 2.2  0.3 - 4.0 (ng/mL)    Troponin I <0.30  <0.30 (ng/mL)    Relative Index RELATIVE INDEX IS INVALID  0.0 - 2.5    BASIC METABOLIC PANEL     Status: Abnormal   Collection Time   07/21/11  6:04 AM      Component Value Range Comment   Sodium 139  135 - 145 (mEq/L)    Potassium 3.0 (*) 3.5 - 5.1 (mEq/L) DELTA CHECK NOTED   Chloride 102  96 - 112 (mEq/L)    CO2 30  19 - 32 (mEq/L)    Glucose, Bld 91  70 - 99 (mg/dL)    BUN 9  6 - 23 (mg/dL)    Creatinine, Ser 8.11  0.50 -  1.10 (mg/dL)    Calcium 9.0  8.4 - 10.5 (mg/dL)    GFR calc non Af Amer >60  >60 (mL/min)    GFR calc Af Amer >60  >60 (mL/min)   CBC     Status: Abnormal   Collection Time   07/21/11  6:04 AM      Component Value Range Comment   WBC 11.9 (*) 4.0 - 10.5 (K/uL)    RBC 4.79  3.87 - 5.11 (MIL/uL)    Hemoglobin 9.4 (*) 12.0 - 15.0 (g/dL)    HCT 16.1 (*) 09.6 - 46.0 (%)    MCV 64.7 (*) 78.0 - 100.0 (fL)    MCH 19.6 (*) 26.0 - 34.0 (pg)    MCHC 30.3  30.0 - 36.0 (g/dL)    RDW 04.5 (*) 40.9 - 15.5 (%)    Platelets 431 (*) 150 - 400 (K/uL)   PROTIME-INR     Status: Abnormal   Collection Time   07/21/11  9:13 AM      Component Value Range Comment   Prothrombin Time 20.7 (*) 11.6 - 15.2 (seconds)    INR 1.74 (*) 0.00 - 1.49    CBC     Status: Abnormal   Collection Time   07/22/11  6:15 AM      Component Value Range Comment   WBC 13.3 (*) 4.0 - 10.5 (K/uL)    RBC 5.31 (*) 3.87 - 5.11 (MIL/uL)    Hemoglobin 10.3 (*) 12.0 - 15.0 (g/dL)    HCT 81.1 (*) 91.4 - 46.0 (%)    MCV 64.2 (*) 78.0 - 100.0 (fL)    MCH 19.4 (*) 26.0 - 34.0 (pg)    MCHC 30.2  30.0 - 36.0 (g/dL)    RDW 78.2 (*) 95.6 - 15.5 (%)    Platelets 511 (*) 150 - 400 (K/uL)   COMPREHENSIVE METABOLIC PANEL     Status: Normal    Collection Time   07/22/11  6:15 AM      Component Value Range Comment   Sodium 140  135 - 145 (mEq/L)    Potassium 3.5  3.5 - 5.1 (mEq/L)    Chloride 101  96 - 112 (mEq/L)    CO2 28  19 - 32 (mEq/L)    Glucose, Bld 90  70 - 99 (mg/dL)    BUN 8  6 - 23 (mg/dL)    Creatinine, Ser 2.13  0.50 - 1.10 (mg/dL)    Calcium 08.6  8.4 - 10.5 (mg/dL)    Total Protein 7.1  6.0 - 8.3 (g/dL)    Albumin 3.7  3.5 - 5.2 (g/dL)    AST 11  0 - 37 (U/L)    ALT 8  0 - 35 (U/L)    Alkaline Phosphatase 82  39 - 117 (U/L)    Total Bilirubin 0.8  0.3 - 1.2 (mg/dL)    GFR calc non Af Amer >60  >60 (mL/min)    GFR calc Af Amer >60  >60 (mL/min)   PROTIME-INR     Status: Abnormal   Collection Time   07/22/11  6:15 AM      Component Value Range Comment   Prothrombin Time 21.0 (*) 11.6 - 15.2 (seconds)    INR 1.78 (*) 0.00 - 1.49     Recent Results (from the past 240 hour(s))  URINE CULTURE     Status: Normal   Collection Time   07/19/11  2:16 PM      Component Value Range  Status Comment   Specimen Description URINE, CATHETERIZED   Final    Special Requests NONE   Final    Setup Time [id number]   Final    Colony Count NO GROWTH   Final    Culture NO GROWTH   Final    Report Status 07/20/2011 FINAL   Final     Dg Chest 2 View  07/21/2011  *RADIOLOGY REPORT*  Clinical Data: Chest pain and shortness of breath.  CHEST - 2 VIEW  Comparison: 07/19/2011  Findings: The interstitial edema seen at the lung bases on the prior study has almost completely resolved.  Mild cardiomegaly is unchanged.  Vascularity is now normal.  No effusions.  IMPRESSION: Almost complete resolution of the mild interstitial edema.  Original Report Authenticated By: Gwynn Burly, M.D.      2D Echocardiogram      Ordering Physician:  Osvaldo Shipper  Order# [id number]  Study Date:  07/21/11      Patient Information      Name  MRN   Description     Yesenia Woods  [id number]   76 year old Female              Result Narrative      *[facility]* [address] [place], [place] [address] [phone]  -------------------------------------------------------------------- Transthoracic Echocardiography  Patient: Emrie, Gayle MR #: [id number] Study Date: 07/20/2011 Gender: F Age: 60 Height: 152.4cm Weight: 70.3kg BSA: 1.69m^2 Pt. Status: Room: A314  SONOGRAPHER Karrie Doffing ADMITTING Osvaldo Shipper Pieter Partridge, Gokul PERFORMING Delma Freeze Penn ATTENDING Mcmanus, Mathis Dad, G cc:  -------------------------------------------------------------------- LV EF: 50% - 55%  -------------------------------------------------------------------- Indications: 518.4 Pulmonary Edema, Acute.  -------------------------------------------------------------------- History: PMH: Coronary artery disease. PMH: anemia, former smoker Risk factors: Hypertension.  -------------------------------------------------------------------- Study Conclusions  - Left ventricle: The cavity size was normal. There was mild concentric hypertrophy. Systolic function was normal. The estimated ejection fraction was in the range of 50% to 55%. Wall motion was normal; there were no regional wall motion abnormalities. - Aortic valve: Mildly calcified annulus. Trileaflet; normal thickness leaflets. Trivial regurgitation. - Mitral valve: Calcified annulus. Mild to moderate regurgitation. - Left atrium: The atrium was mildly dilated. - Right atrium: The atrium was mildly dilated. - Atrial septum: No defect or patent foramen ovale was identified. - Pulmonary arteries: Systolic pressure was mildly increased. PA peak pressure: 49mm Hg (S). Transthoracic echocardiography. M-mode, complete 2D, spectral Doppler, and color Doppler. Height: Height: 152.4cm. Height: 60in. Weight: Weight: 70.3kg. Weight: 154.7lb. Body mass index: BMI: 30.3kg/m^2. Body surface area: BSA: 1.66m^2. Patient status: Inpatient.  Location: Bedside.        Medications: I have reviewed the patient's current medications.  Impression: 1. Severe anemia with Hemoccult stool positive. Status post blood transfusion. 2. Possible congestive heart failure. Normal ejection fraction and mild left ventricular hypertrophy. 3. Hypertension. 4. Chronic anticoagulation.     Plan: 1. Endoscopy today, upper and probably lower. 2. Continue to monitor hemoglobin.     LOS: 3 days   Sharion Grieves C 07/22/2011, 9:02 AM

## 2011-07-22 NOTE — Progress Notes (Signed)
Patient's lab data are reviewed from this morning. Her INR is going and now it's 1.78 Therefore will give her 2 mg of vitamin K subcutaneous.

## 2011-07-23 LAB — CBC
HCT: 33 % — ABNORMAL LOW (ref 36.0–46.0)
RDW: 32.5 % — ABNORMAL HIGH (ref 11.5–15.5)
WBC: 13.5 10*3/uL — ABNORMAL HIGH (ref 4.0–10.5)

## 2011-07-23 LAB — BASIC METABOLIC PANEL
BUN: 7 mg/dL (ref 6–23)
Chloride: 97 mEq/L (ref 96–112)
GFR calc Af Amer: >60 mL/min (ref >60)
Potassium: 3.3 mEq/L — ABNORMAL LOW (ref 3.5–5.1)

## 2011-07-23 MED ORDER — POTASSIUM CHLORIDE CRYS ER 20 MEQ PO TBCR: 40.0000 meq | EXTENDED_RELEASE_TABLET | Freq: Once | ORAL | Status: DC

## 2011-07-23 MED ORDER — POTASSIUM CHLORIDE CRYS ER 20 MEQ PO TBCR
40.0000 meq | EXTENDED_RELEASE_TABLET | Freq: Once | ORAL | Status: AC
  Administered 2011-07-23: 40 meq via ORAL
  Filled 2011-07-23: qty 2

## 2011-07-23 NOTE — Discharge Summary (Signed)
Physician Discharge Summary  Patient ID: Yesenia Woods MRN: [id number] DOB/AGE: 06/06/1935 76 y.o.  Admit date: 07/19/2011 Discharge date: 07/23/2011    Discharge Diagnoses:  1. Severe anemia with Hemoccult stool positive, status post 3 units blood transfusion. Etiology unclear. Upper GI endoscopy and colonoscopy unremarkable. 2. Fluid overload and probable diastolic congestive heart failure. 3. Hypertension. 4. Chronic anticoagulation, unclear reason.   Current Discharge Medication List    START taking these medications   Details  potassium chloride SA (K-DUR,KLOR-CON) 20 MEQ tablet Take 2 tablets (40 mEq total) by mouth once. Qty: 2 tablet, Refills: 0      CONTINUE these medications which have NOT CHANGED   Details  metoprolol (LOPRESSOR) 50 MG tablet Take 50 mg by mouth 2 (two) times daily.      omeprazole (PRILOSEC) 20 MG capsule Take 20 mg by mouth daily.      pravastatin (PRAVACHOL) 40 MG tablet Take 40 mg by mouth at bedtime.      !! warfarin (COUMADIN) 4 MG tablet Take 4 mg by mouth daily. Takes Tuesday and Fridays     !! warfarin (COUMADIN) 7.5 MG tablet Take 7.5 mg by mouth daily. Except for Tuesday and Friday      !! - Potential duplicate medications found. Please discuss with provider.    STOP taking these medications     aspirin 325 MG EC tablet         Discharged Condition: Stable and improved.    Consults: Gastroenterology Dr. Lionel December.  Significant Diagnostic Studies: Dg Chest 2 View  07/21/2011  *RADIOLOGY REPORT*  Clinical Data: Chest pain and shortness of breath.  CHEST - 2 VIEW  Comparison: 07/19/2011  Findings: The interstitial edema seen at the lung bases on the prior study has almost completely resolved.  Mild cardiomegaly is unchanged.  Vascularity is now normal.  No effusions.  IMPRESSION: Almost complete resolution of the mild interstitial edema.  Original Report Authenticated By: Gwynn Burly, M.D.   Dg Chest 2 View  07/19/2011   *RADIOLOGY REPORT*  Clinical Data: Chest pain and shortness of breath.  CHEST - 2 VIEW  Comparison: None.  Findings: Trachea is midline.  Heart is at the upper limits of normal in size.  Peripheral septal lines are seen in the lung bases.  Bibasilar atelectasis and/or scarring.  No definite pleural fluid.  IMPRESSION: Early basilar dependent interstitial pulmonary edema.  Original Report Authenticated By: Reyes Ivan, M.D.    Lab Results: Results for orders placed during the hospital encounter of 07/19/11 (from the past 48 hour(s))  CBC     Status: Abnormal   Collection Time   07/22/11  6:15 AM      Component Value Range Comment   WBC 13.3 (*) 4.0 - 10.5 (K/uL)    RBC 5.31 (*) 3.87 - 5.11 (MIL/uL)    Hemoglobin 10.3 (*) 12.0 - 15.0 (g/dL)    HCT 52.8 (*) 41.3 - 46.0 (%)    MCV 64.2 (*) 78.0 - 100.0 (fL)    MCH 19.4 (*) 26.0 - 34.0 (pg)    MCHC 30.2  30.0 - 36.0 (g/dL)    RDW 24.4 (*) 01.0 - 15.5 (%)    Platelets 511 (*) 150 - 400 (K/uL)   COMPREHENSIVE METABOLIC PANEL     Status: Normal   Collection Time   07/22/11  6:15 AM      Component Value Range Comment   Sodium 140  135 - 145 (mEq/L)    Potassium  3.5  3.5 - 5.1 (mEq/L)    Chloride 101  96 - 112 (mEq/L)    CO2 28  19 - 32 (mEq/L)    Glucose, Bld 90  70 - 99 (mg/dL)    BUN 8  6 - 23 (mg/dL)    Creatinine, Ser 9.81  0.50 - 1.10 (mg/dL)    Calcium 19.1  8.4 - 10.5 (mg/dL)    Total Protein 7.1  6.0 - 8.3 (g/dL)    Albumin 3.7  3.5 - 5.2 (g/dL)    AST 11  0 - 37 (U/L)    ALT 8  0 - 35 (U/L)    Alkaline Phosphatase 82  39 - 117 (U/L)    Total Bilirubin 0.8  0.3 - 1.2 (mg/dL)    GFR calc non Af Amer >60  >60 (mL/min)    GFR calc Af Amer >60  >60 (mL/min)   PROTIME-INR     Status: Abnormal   Collection Time   07/22/11  6:15 AM      Component Value Range Comment   Prothrombin Time 21.0 (*) 11.6 - 15.2 (seconds)    INR 1.78 (*) 0.00 - 1.49    CBC     Status: Abnormal   Collection Time   07/23/11  5:53 AM      Component Value  Range Comment   WBC 13.5 (*) 4.0 - 10.5 (K/uL)    RBC 5.19 (*) 3.87 - 5.11 (MIL/uL)    Hemoglobin 10.1 (*) 12.0 - 15.0 (g/dL)    HCT 47.8 (*) 29.5 - 46.0 (%)    MCV 63.6 (*) 78.0 - 100.0 (fL)    MCH 19.5 (*) 26.0 - 34.0 (pg)    MCHC 30.6  30.0 - 36.0 (g/dL)    RDW 62.1 (*) 30.8 - 15.5 (%)    Platelets 502 (*) 150 - 400 (K/uL)   BASIC METABOLIC PANEL     Status: Abnormal   Collection Time   07/23/11  5:53 AM      Component Value Range Comment   Sodium 137  135 - 145 (mEq/L)    Potassium 3.3 (*) 3.5 - 5.1 (mEq/L)    Chloride 97  96 - 112 (mEq/L)    CO2 29  19 - 32 (mEq/L)    Glucose, Bld 93  70 - 99 (mg/dL)    BUN 7  6 - 23 (mg/dL)    Creatinine, Ser 6.57  0.50 - 1.10 (mg/dL)    Calcium 84.6  8.4 - 10.5 (mg/dL)    GFR calc non Af Amer >60  >60 (mL/min)    GFR calc Af Amer >60  >60 (mL/min)    Recent Results (from the past 240 hour(s))  URINE CULTURE     Status: Normal   Collection Time   07/19/11  2:16 PM      Component Value Range Status Comment   Specimen Description URINE, CATHETERIZED   Final    Special Requests NONE   Final    Setup Time [id number]   Final    Colony Count NO GROWTH   Final    Culture NO GROWTH   Final    Report Status 07/20/2011 FINAL   Final      Hospital Course: This 76 year old lady was admitted with a history of dyspnea for one week. Please see initial history and physical examination done by Dr. Rito Ehrlich. She also described a chest fullness and some leg swelling. She denied any weight gain. She was found to have  a hemoglobin of 6.4 on admission which was microcytic. She also had findings on chest x-ray suggestive of congestive heart failure. She was given 3 units of blood transfusion and intravenous Lasix as required. Her chest x-ray showed resolution of pulmonary edema. Her hemoglobin improved to a reasonable level of 10.1. She was seen by gastroenterology, Dr. Lionel December, who performed an EGD and colonoscopy. The EGD was normal. There was a Y-shaped  sessile polyp of the cecum and a piecemeal polypectomy was performed. There were external hemorrhoids and a single anal papilla. There was no bleeding lesions seen. She is feeling much improved and back to her baseline. She is not dyspneic anymore.  Discharge Exam: Blood pressure 148/75, pulse 70, temperature 99 F (37.2 C), temperature source Oral, resp. rate 20, height 5' (1.524 m), weight 72.576 kg (160 lb), SpO2 98.00%. She looks systemically well. She does not look pale. Heart sounds are present and normal without gallop rhythm. Jugular venous pressure not raised. Lung fields are clear. She is alert and orientated without any focal neurological signs.  Disposition: Home. We will arrange for her to have an outpatient video capsule endoscopy and followup with Dr. Karilyn Cota. She can restart her Coumadin now but will have to restart her aspirin in approximately one week.  Discharge Orders    Future Orders Please Complete By Expires   Diet - low sodium heart healthy      Increase activity slowly      Discharge instructions      Comments:   You  can restart the aspirin 325 mg daily after one week.        SignedWilson Singer 07/23/2011, 9:41 AM

## 2011-07-23 NOTE — Progress Notes (Signed)
Patient discharged home with family.

## 2011-07-26 ENCOUNTER — Telehealth (INDEPENDENT_AMBULATORY_CARE_PROVIDER_SITE_OTHER): Payer: Self-pay | Admitting: *Deleted

## 2011-07-26 DIAGNOSIS — K922 Gastrointestinal hemorrhage, unspecified: Secondary | ICD-10-CM

## 2011-07-26 DIAGNOSIS — R195 Other fecal abnormalities: Secondary | ICD-10-CM

## 2011-07-26 NOTE — Telephone Encounter (Signed)
Per Dr Karilyn Cota patient needs to be scheduled for GIVENS study  GIVENS sch'd 07/30/11 @ 7:30 (7:00), patient aware

## 2011-07-28 ENCOUNTER — Encounter (INDEPENDENT_AMBULATORY_CARE_PROVIDER_SITE_OTHER): Payer: Self-pay | Admitting: *Deleted

## 2011-07-29 ENCOUNTER — Encounter (HOSPITAL_COMMUNITY): Payer: Self-pay | Admitting: Internal Medicine

## 2011-07-30 ENCOUNTER — Encounter (HOSPITAL_COMMUNITY): Payer: Self-pay | Admitting: *Deleted

## 2011-07-30 ENCOUNTER — Ambulatory Visit (HOSPITAL_COMMUNITY)
Admission: RE | Admit: 2011-07-30 | Discharge: 2011-07-30 | Disposition: A | Discharge status: Home / Self Care | Payer: Medicare Other | Source: Ambulatory Visit | Attending: Internal Medicine | Admitting: Internal Medicine

## 2011-07-30 ENCOUNTER — Encounter (HOSPITAL_COMMUNITY)
Admission: RE | Disposition: A | Discharge status: Home / Self Care | Payer: Self-pay | Source: Ambulatory Visit | Attending: Internal Medicine

## 2011-07-30 DIAGNOSIS — K5520 Angiodysplasia of colon without hemorrhage: Secondary | ICD-10-CM | POA: Insufficient documentation

## 2011-07-30 DIAGNOSIS — K319 Disease of stomach and duodenum, unspecified: Secondary | ICD-10-CM

## 2011-07-30 DIAGNOSIS — K921 Melena: Secondary | ICD-10-CM

## 2011-07-30 DIAGNOSIS — Z9889 Other specified postprocedural states: Secondary | ICD-10-CM

## 2011-07-30 DIAGNOSIS — D509 Iron deficiency anemia, unspecified: Secondary | ICD-10-CM

## 2011-07-30 DIAGNOSIS — K269 Duodenal ulcer, unspecified as acute or chronic, without hemorrhage or perforation: Secondary | ICD-10-CM | POA: Insufficient documentation

## 2011-07-30 DIAGNOSIS — K267 Chronic duodenal ulcer without hemorrhage or perforation: Secondary | ICD-10-CM

## 2011-07-30 HISTORY — PX: GIVENS CAPSULE STUDY: SHX5432

## 2011-07-30 SURGERY — IMAGING PROCEDURE, GI TRACT, INTRALUMINAL, VIA CAPSULE
Anesthesia: Moderate Sedation

## 2011-07-30 NOTE — H&P (Signed)
This is an update. Please see consult from 07/30/2011. Patient was hospitalized last week with iron deficiency anemia and heme positive stools. She was transfused. She had EGD and a colonoscopy no bleeding lesion was identified. She did have adenoma removed from her cecum. Patient is chronically anticoagulated and also has been on aspirin. She is now here to undergo small bowel given capsule study.

## 2011-08-01 NOTE — Op Note (Signed)
Yesenia, Woods              ACCOUNT NO.:  [id number]  MEDICAL RECORD NO.:  [id number]  LOCATION:  [facility]                          FACILITY:  [facility]  PHYSICIAN:  Lionel December, M.D.    DATE OF BIRTH:  [birth year]  DATE OF PROCEDURE:  08/01/2011 DATE OF DISCHARGE:  07/30/2011                              OPERATIVE REPORT   PROCEDURE:  Small bowel given capsule study.  INDICATION:  Yesenia Woods is a 76 year old African American female who was recently hospitalized with GI bleed and iron deficiency anemia.  She has been on ASA and chronically anticoagulated.  She had EGD and a colonoscopy.  While she had a tubular adenoma removed snared from her cecum, but no bleeding lesion was found.  Therefore, further evaluation with given capsule was advised.  Procedure and risks were reviewed with the patient and informed consent was obtained.  FINDINGS:  The patient was able to swallow given capsule without any difficulty.  First MH that is available for review is that of a stomach.  This is an 8-hour study.  Capsule reached duodenal bulb in 31 minutes and 38 seconds and that ileocecal valve at 3 hours 48 minutes and 27 seconds.  There appears to be hyperemia to antral mucosa, but these changes are not typical of telangiectasia.  Please note, the patient's EGD of July 22, 2011, did not reveal any vascular abnormalities.  There is a very small duodenal ulcer, possibly in the postbulbar region without stigmata of bleeding.  Three AV malformations identified in the jejunum located close to one another best seen on MH at 1 hour 2 minutes and 33 seconds, 1 hour 2 minutes and 38 seconds and 1 hour 3 minutes and 35 seconds.  Capsule reached the colon, but mucosa of the colon could not be evaluated because of stool.  FINAL DIAGNOSES: 1. Hyperemia to gastric mucosa without stigmata of bleeding. 2. Small duodenal ulcer without stigmata of bleeding. 3. 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 4. Capsule passed into the cecum at approximately 3 hours and 49     minutes. 5. She could be losing blood intermittently from jejunal arteriovenous     malformations and could have lost blood from this ulcer which was     not seen on recent EGD. 6. These findings were reviewed with the patient over the phone.  RECOMMENDATIONS: 1. The patient can go back on aspirin, but only take 81 mg daily     instead of 325.  Please note, the patient is back on her Coumadin. 2. She has an appointment with her cardiologist, Dr. Earna Coder.  She     will have H and H and also will get an H. pylori serology by him. 3. I will be contacting the patient with these results when they are     available for review.          ______________________________ Lionel December, M.D.     NR/MEDQ  D:  08/01/2011  T:  08/01/2011  Job:  [id number]

## 2011-08-01 NOTE — Op Note (Signed)
Job [id number]

## 2011-08-05 ENCOUNTER — Encounter (HOSPITAL_COMMUNITY): Payer: Self-pay | Admitting: Internal Medicine

## 2011-11-23 DIAGNOSIS — D649 Anemia, unspecified: Secondary | ICD-10-CM | POA: Diagnosis not present

## 2011-11-29 DIAGNOSIS — I4891 Unspecified atrial fibrillation: Secondary | ICD-10-CM | POA: Diagnosis not present

## 2011-11-29 DIAGNOSIS — I119 Hypertensive heart disease without heart failure: Secondary | ICD-10-CM | POA: Diagnosis not present

## 2011-11-29 DIAGNOSIS — I2510 Atherosclerotic heart disease of native coronary artery without angina pectoris: Secondary | ICD-10-CM | POA: Diagnosis not present

## 2011-11-29 DIAGNOSIS — E782 Mixed hyperlipidemia: Secondary | ICD-10-CM | POA: Diagnosis not present

## 2011-12-14 DIAGNOSIS — D500 Iron deficiency anemia secondary to blood loss (chronic): Secondary | ICD-10-CM | POA: Diagnosis not present

## 2011-12-14 DIAGNOSIS — K922 Gastrointestinal hemorrhage, unspecified: Secondary | ICD-10-CM | POA: Diagnosis not present

## 2011-12-24 DIAGNOSIS — I4891 Unspecified atrial fibrillation: Secondary | ICD-10-CM | POA: Diagnosis not present

## 2011-12-24 DIAGNOSIS — I119 Hypertensive heart disease without heart failure: Secondary | ICD-10-CM | POA: Diagnosis not present

## 2011-12-27 DIAGNOSIS — K219 Gastro-esophageal reflux disease without esophagitis: Secondary | ICD-10-CM | POA: Diagnosis not present

## 2011-12-27 DIAGNOSIS — D509 Iron deficiency anemia, unspecified: Secondary | ICD-10-CM | POA: Diagnosis not present

## 2011-12-27 DIAGNOSIS — I10 Essential (primary) hypertension: Secondary | ICD-10-CM | POA: Diagnosis not present

## 2011-12-31 DIAGNOSIS — D649 Anemia, unspecified: Secondary | ICD-10-CM | POA: Diagnosis not present

## 2012-01-21 DIAGNOSIS — I4891 Unspecified atrial fibrillation: Secondary | ICD-10-CM | POA: Diagnosis not present

## 2012-01-21 DIAGNOSIS — I119 Hypertensive heart disease without heart failure: Secondary | ICD-10-CM | POA: Diagnosis not present

## 2012-02-07 DIAGNOSIS — I4891 Unspecified atrial fibrillation: Secondary | ICD-10-CM | POA: Diagnosis not present

## 2012-02-07 DIAGNOSIS — D649 Anemia, unspecified: Secondary | ICD-10-CM | POA: Diagnosis not present

## 2012-02-07 DIAGNOSIS — K219 Gastro-esophageal reflux disease without esophagitis: Secondary | ICD-10-CM | POA: Diagnosis not present

## 2012-02-07 DIAGNOSIS — I10 Essential (primary) hypertension: Secondary | ICD-10-CM | POA: Diagnosis not present

## 2012-02-14 DIAGNOSIS — D500 Iron deficiency anemia secondary to blood loss (chronic): Secondary | ICD-10-CM | POA: Diagnosis not present

## 2012-02-14 DIAGNOSIS — K922 Gastrointestinal hemorrhage, unspecified: Secondary | ICD-10-CM | POA: Diagnosis not present

## 2012-02-14 DIAGNOSIS — Z86010 Personal history of colonic polyps: Secondary | ICD-10-CM | POA: Diagnosis not present

## 2012-02-23 DIAGNOSIS — I4891 Unspecified atrial fibrillation: Secondary | ICD-10-CM | POA: Diagnosis not present

## 2012-02-23 DIAGNOSIS — Z1211 Encounter for screening for malignant neoplasm of colon: Secondary | ICD-10-CM | POA: Diagnosis not present

## 2012-02-23 DIAGNOSIS — Z951 Presence of aortocoronary bypass graft: Secondary | ICD-10-CM | POA: Diagnosis not present

## 2012-02-23 DIAGNOSIS — K449 Diaphragmatic hernia without obstruction or gangrene: Secondary | ICD-10-CM | POA: Diagnosis not present

## 2012-02-23 DIAGNOSIS — D126 Benign neoplasm of colon, unspecified: Secondary | ICD-10-CM | POA: Diagnosis not present

## 2012-02-23 DIAGNOSIS — Z86010 Personal history of colonic polyps: Secondary | ICD-10-CM | POA: Diagnosis not present

## 2012-02-23 DIAGNOSIS — K5289 Other specified noninfective gastroenteritis and colitis: Secondary | ICD-10-CM | POA: Diagnosis not present

## 2012-02-23 DIAGNOSIS — K922 Gastrointestinal hemorrhage, unspecified: Secondary | ICD-10-CM | POA: Diagnosis not present

## 2012-02-23 DIAGNOSIS — K900 Celiac disease: Secondary | ICD-10-CM | POA: Diagnosis not present

## 2012-03-09 DIAGNOSIS — D126 Benign neoplasm of colon, unspecified: Secondary | ICD-10-CM | POA: Diagnosis not present

## 2012-03-09 DIAGNOSIS — D500 Iron deficiency anemia secondary to blood loss (chronic): Secondary | ICD-10-CM | POA: Diagnosis not present

## 2012-03-09 DIAGNOSIS — Z86010 Personal history of colonic polyps: Secondary | ICD-10-CM | POA: Diagnosis not present

## 2012-03-09 DIAGNOSIS — K922 Gastrointestinal hemorrhage, unspecified: Secondary | ICD-10-CM | POA: Diagnosis not present

## 2012-03-16 DIAGNOSIS — I4891 Unspecified atrial fibrillation: Secondary | ICD-10-CM | POA: Diagnosis not present

## 2012-03-16 DIAGNOSIS — I119 Hypertensive heart disease without heart failure: Secondary | ICD-10-CM | POA: Diagnosis not present

## 2012-03-20 DIAGNOSIS — N63 Unspecified lump in unspecified breast: Secondary | ICD-10-CM | POA: Diagnosis not present

## 2012-04-05 DIAGNOSIS — D509 Iron deficiency anemia, unspecified: Secondary | ICD-10-CM | POA: Diagnosis not present

## 2012-04-05 DIAGNOSIS — I10 Essential (primary) hypertension: Secondary | ICD-10-CM | POA: Diagnosis not present

## 2012-04-05 DIAGNOSIS — I4891 Unspecified atrial fibrillation: Secondary | ICD-10-CM | POA: Diagnosis not present

## 2012-04-06 DIAGNOSIS — C50919 Malignant neoplasm of unspecified site of unspecified female breast: Secondary | ICD-10-CM | POA: Diagnosis not present

## 2012-04-27 DIAGNOSIS — I119 Hypertensive heart disease without heart failure: Secondary | ICD-10-CM | POA: Diagnosis not present

## 2012-04-27 DIAGNOSIS — I4891 Unspecified atrial fibrillation: Secondary | ICD-10-CM | POA: Diagnosis not present

## 2012-05-02 DIAGNOSIS — D649 Anemia, unspecified: Secondary | ICD-10-CM | POA: Diagnosis not present

## 2012-05-17 ENCOUNTER — Encounter (INDEPENDENT_AMBULATORY_CARE_PROVIDER_SITE_OTHER): Payer: Self-pay | Admitting: *Deleted

## 2012-06-02 DIAGNOSIS — E782 Mixed hyperlipidemia: Secondary | ICD-10-CM | POA: Diagnosis not present

## 2012-06-02 DIAGNOSIS — E559 Vitamin D deficiency, unspecified: Secondary | ICD-10-CM | POA: Diagnosis not present

## 2012-06-02 DIAGNOSIS — I2510 Atherosclerotic heart disease of native coronary artery without angina pectoris: Secondary | ICD-10-CM | POA: Diagnosis not present

## 2012-06-02 DIAGNOSIS — E039 Hypothyroidism, unspecified: Secondary | ICD-10-CM | POA: Diagnosis not present

## 2012-06-02 DIAGNOSIS — E119 Type 2 diabetes mellitus without complications: Secondary | ICD-10-CM | POA: Diagnosis not present

## 2012-06-02 DIAGNOSIS — I119 Hypertensive heart disease without heart failure: Secondary | ICD-10-CM | POA: Diagnosis not present

## 2012-06-02 DIAGNOSIS — I4891 Unspecified atrial fibrillation: Secondary | ICD-10-CM | POA: Diagnosis not present

## 2012-06-19 ENCOUNTER — Ambulatory Visit (INDEPENDENT_AMBULATORY_CARE_PROVIDER_SITE_OTHER): Payer: Medicare Other | Admitting: Internal Medicine

## 2012-06-19 ENCOUNTER — Encounter (INDEPENDENT_AMBULATORY_CARE_PROVIDER_SITE_OTHER): Payer: Self-pay | Admitting: Internal Medicine

## 2012-06-19 VITALS — BP 120/70 | HR 68 | Temp 97.2°F | Resp 20 | Ht 60.0 in | Wt 164.8 lb

## 2012-06-19 DIAGNOSIS — D509 Iron deficiency anemia, unspecified: Secondary | ICD-10-CM | POA: Insufficient documentation

## 2012-06-19 DIAGNOSIS — R195 Other fecal abnormalities: Secondary | ICD-10-CM | POA: Diagnosis not present

## 2012-06-19 NOTE — Progress Notes (Signed)
Presenting complaint;  Heme positive stools and iron deficiency anemia.  History of present illness; Patient is 77 year old African American female patient of Dr. Katherina Right who is here for reevaluation of her iron deficiency anemia and heme  positive stools. She was last evaluated by she was admitted at the hospital and received 3 units of PRBCs. Workup is reviewed under past medical history. More recently she was evaluated by Dr. Samuella Cota of [facility] EGD and colonoscopy and will bleeding lesion was found. Patient has no complaints. She states her stool has been guaiac positive on 2 different occasions but she denies melena or rectal bleeding. She states her heartburn is well controlled with therapy. She denies dysphagia nausea, vomiting or abdominal pain. She takes iron pills erratically because it makes her constipated. Her appetite is fair but she is not losing weight. She states she eats snacks all the time. She denies which hematuria or vaginal bleeding. She does not take OTC NSAIDs other than low-dose aspirin  Current Medications: Current Outpatient Prescriptions  Medication Sig Dispense Refill  . aspirin 81 MG tablet Take 81 mg by mouth daily.      . ferrous sulfate 325 (65 FE) MG tablet Take 325 mg by mouth as needed.      . metoprolol (LOPRESSOR) 50 MG tablet Take 50 mg by mouth 2 (two) times daily.        Marland Kitchen omeprazole (PRILOSEC) 20 MG capsule Take 20 mg by mouth daily.        . pravastatin (PRAVACHOL) 40 MG tablet Take 40 mg by mouth at bedtime.        Marland Kitchen warfarin (COUMADIN) 4 MG tablet Take 4 mg by mouth daily. Patient states that she takes 4 mg every day but Friday      . warfarin (COUMADIN) 7.5 MG tablet Take 7.5 mg by mouth daily. Patient takes on Fridays      . potassium chloride SA (K-DUR,KLOR-CON) 20 MEQ tablet Take 2 tablets (40 mEq total) by mouth once.  2 tablet  0  past medical history; H. pylori infection treated in August 2012. History of iron deficiency  anemia and GI bleed and workup as follows. Patient has iron deficiency anemia secondary to chronic or intermittent occult GI bleed and prior workup as follows. She had EGD, colonoscopy and small bowel given capsule study  by me in September 2012 at [facility]. Upper GI tract was normal. 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 During this admission she received 3 units of PRBCs. More recently she was evaluated in [place] by Dr. Samuella Cota. She had EGD and colonoscopy on 02/23/2012 revealing small sliding hiatal hernia, duodenal biopsy negative for celiac disease and colonoscopy revealed small tubular adenoma at sigmoid colon. Hypertension. Coronary artery disease. Status post CABG in 1987. Atrial fibrillation. Chronic GERD. Hyperlipidemia. History of breast carcinoma status post left mastectomy. Bilateral cataract extraction. Foot surgery.  Objective: Blood pressure 120/70, pulse 68, temperature 97.2 F (36.2 C), temperature source Oral, resp. rate 20, height 5' (1.524 m), weight 164 lb 12.8 oz (74.753 kg). Patient is alert no acute distress. Conjunctiva is pink. Sclera is nonicteric Oropharyngeal mucosa is normal. No neck masses or thyromegaly noted. Cardiac exam with regular rhythm normal S1 and S2. No murmur or gallop noted. Lungs are clear to auscultation. Abdomen. Bowel sounds are normal. No bruits noted. On palpation soft abdomen  without tenderness organomegaly or masses.  No LE edema or clubbing noted.  Labs/studies Results: H&H from 10/13/2011 was 8.5 and 28.3. Serum iron was 13 TIBC 500 saturation 3% vitamin B12 level of 188(180-914). H&H on 02/07/2012 was 12.9 and 42.1. H&H on 04/05/2012 was 9.9 and 31.6 and serum ferritin was 5. H&H from 06/02/2012 was 11.7 and 35.5.  Assessment:  Patient has iron deficiency anemia secondary to chronic  or intermittent occult GI bleed who has undergone 2 EGDs 2 colonoscopies along with small bowel given capsule study since September last year. While her stool has been positive, she has never experienced overt GI bleed. She presently does not have an GI GI symptoms exam is unremarkable. Suspect patient's iron deficiency anemia is due to chronic blood loss most likely from jejunal AV malformations and she may also have mucosal injury secondary to low dose aspirin. Since she has never experienced overt bleeding, we should be able to maintain her H&H with chronic iron therapy but she does not take iron regularly because of constipation and furthermore she may have impaired iron absorption secondary to chronic PPI therapy.  Her last hemoglobin was 11.7. I do not believe she needs further evaluation unless and until she has evidence of active bleed or need for blood transfusion. If she truly is intolerant of oral iron therapy will consider parenteral iron infusion.  Plan: Patient advised not to take OTC NSAIDs. Patient to check with Dr. Earna Coder if aspirin can be discontinued or at least dose reduced to 40 mg a day. Patient will take iron pill 3 times a week. Should she develop constipation she will start Colace 2 capsules by mouth each bedtime. She will have serum ferritin along with her H&H in one month by Dr. Katherina Right. Patient advised to call us immediately if she has rectal bleeding or tarry stools. Office visit in 4 months.   CC; Dr.Anupreet Phineas Semen, MD. Corena Pilgrim, MD.

## 2012-06-19 NOTE — Patient Instructions (Addendum)
Please check with Dr. Earna Coder if aspirin could be discontinued or dose could be reduced to half of baby aspirin daily  Take iron pills 3 times a week. Can take Colace(stool softener) 2 capsules daily by mouth if you develop constipation with iron. Please have your physician check serum ferritin with next hemoglobin and hematocrit and have them send report to us(fax [phone]). Notify if you have rectal bleeding.

## 2012-06-20 DIAGNOSIS — I4891 Unspecified atrial fibrillation: Secondary | ICD-10-CM | POA: Diagnosis not present

## 2012-06-20 DIAGNOSIS — E785 Hyperlipidemia, unspecified: Secondary | ICD-10-CM | POA: Diagnosis not present

## 2012-06-20 DIAGNOSIS — I2510 Atherosclerotic heart disease of native coronary artery without angina pectoris: Secondary | ICD-10-CM | POA: Diagnosis not present

## 2012-06-20 DIAGNOSIS — I119 Hypertensive heart disease without heart failure: Secondary | ICD-10-CM | POA: Diagnosis not present

## 2012-06-20 DIAGNOSIS — D510 Vitamin B12 deficiency anemia due to intrinsic factor deficiency: Secondary | ICD-10-CM | POA: Diagnosis not present

## 2012-07-06 DIAGNOSIS — K219 Gastro-esophageal reflux disease without esophagitis: Secondary | ICD-10-CM | POA: Diagnosis not present

## 2012-07-06 DIAGNOSIS — I4891 Unspecified atrial fibrillation: Secondary | ICD-10-CM | POA: Diagnosis not present

## 2012-07-06 DIAGNOSIS — I10 Essential (primary) hypertension: Secondary | ICD-10-CM | POA: Diagnosis not present

## 2012-07-06 DIAGNOSIS — E785 Hyperlipidemia, unspecified: Secondary | ICD-10-CM | POA: Diagnosis not present

## 2012-07-06 DIAGNOSIS — D649 Anemia, unspecified: Secondary | ICD-10-CM | POA: Diagnosis not present

## 2012-07-12 ENCOUNTER — Encounter (INDEPENDENT_AMBULATORY_CARE_PROVIDER_SITE_OTHER): Payer: Self-pay

## 2012-07-20 DIAGNOSIS — I4891 Unspecified atrial fibrillation: Secondary | ICD-10-CM | POA: Diagnosis not present

## 2012-07-20 DIAGNOSIS — I119 Hypertensive heart disease without heart failure: Secondary | ICD-10-CM | POA: Diagnosis not present

## 2012-07-31 DIAGNOSIS — I2510 Atherosclerotic heart disease of native coronary artery without angina pectoris: Secondary | ICD-10-CM | POA: Diagnosis not present

## 2012-07-31 DIAGNOSIS — I4891 Unspecified atrial fibrillation: Secondary | ICD-10-CM | POA: Diagnosis not present

## 2012-07-31 DIAGNOSIS — I119 Hypertensive heart disease without heart failure: Secondary | ICD-10-CM | POA: Diagnosis not present

## 2012-08-01 DIAGNOSIS — I471 Supraventricular tachycardia: Secondary | ICD-10-CM | POA: Diagnosis not present

## 2012-08-01 DIAGNOSIS — I491 Atrial premature depolarization: Secondary | ICD-10-CM | POA: Diagnosis not present

## 2012-08-04 DIAGNOSIS — I4949 Other premature depolarization: Secondary | ICD-10-CM | POA: Diagnosis not present

## 2012-08-04 DIAGNOSIS — R002 Palpitations: Secondary | ICD-10-CM | POA: Diagnosis not present

## 2012-08-31 DIAGNOSIS — R002 Palpitations: Secondary | ICD-10-CM | POA: Diagnosis not present

## 2012-08-31 DIAGNOSIS — I4949 Other premature depolarization: Secondary | ICD-10-CM | POA: Diagnosis not present

## 2012-08-31 DIAGNOSIS — I4892 Unspecified atrial flutter: Secondary | ICD-10-CM | POA: Diagnosis not present

## 2012-09-27 DIAGNOSIS — Z961 Presence of intraocular lens: Secondary | ICD-10-CM | POA: Diagnosis not present

## 2012-09-27 DIAGNOSIS — I10 Essential (primary) hypertension: Secondary | ICD-10-CM | POA: Diagnosis not present

## 2012-10-05 DIAGNOSIS — I4949 Other premature depolarization: Secondary | ICD-10-CM | POA: Diagnosis not present

## 2012-10-05 DIAGNOSIS — I4891 Unspecified atrial fibrillation: Secondary | ICD-10-CM | POA: Diagnosis not present

## 2012-10-05 DIAGNOSIS — R002 Palpitations: Secondary | ICD-10-CM | POA: Diagnosis not present

## 2012-10-05 DIAGNOSIS — Z23 Encounter for immunization: Secondary | ICD-10-CM | POA: Diagnosis not present

## 2012-10-17 ENCOUNTER — Ambulatory Visit (INDEPENDENT_AMBULATORY_CARE_PROVIDER_SITE_OTHER): Payer: Medicare Other | Admitting: Internal Medicine

## 2012-10-17 ENCOUNTER — Encounter (INDEPENDENT_AMBULATORY_CARE_PROVIDER_SITE_OTHER): Payer: Self-pay | Admitting: Internal Medicine

## 2012-10-17 VITALS — BP 130/70 | HR 76 | Temp 98.2°F | Resp 18 | Ht 60.0 in | Wt 160.4 lb

## 2012-10-17 DIAGNOSIS — D509 Iron deficiency anemia, unspecified: Secondary | ICD-10-CM

## 2012-10-17 LAB — CBC
HCT: 37.7 % (ref 36.0–46.0)
Hemoglobin: 12 g/dL (ref 12.0–15.0)
MCHC: 31.8 g/dL (ref 30.0–36.0)
WBC: 9.3 10*3/uL (ref 4.0–10.5)

## 2012-10-17 NOTE — Progress Notes (Signed)
Presenting complaint;  Followup for iron deficiency anemia.  Subjective:  Patient is 77 year old African female who is here for scheduled visit accompanied by her daughter. She has history of GI bleed in the setting of anticoagulations and was evaluated by me in September 2012 and she was hospitalized at [facility] and underwent EGD, colonoscopy and small bowel given capsule study. She was reevaluated with EGD and colonoscopy by Dr. Samuella Cota of [facility] in April 2013. Prior to her last visit she was noted to have heme positive stools but her hemoglobin has been gradually coming up. Since she was doing so well on her last visit she is advised to continue iron and call us should she have overt GI bleed. She has not had any problems. She feels fine. She says her heartburn is well controlled as long as takes the medication.  She may experience occasional hematochezia when she noticed a small amount of blood on tissue with her bowel movement her appetite is up and down. She has lost 4 pounds since last visit. She denies abdominal pain.  Current Medications: Current Outpatient Prescriptions  Medication Sig Dispense Refill  . aspirin 81 MG tablet Take 81 mg by mouth daily.      . ferrous sulfate 325 (65 FE) MG tablet Take 325 mg by mouth as needed.      . metoprolol (LOPRESSOR) 50 MG tablet Take 50 mg by mouth 2 (two) times daily.        Marland Kitchen omeprazole (PRILOSEC) 20 MG capsule Take 20 mg by mouth daily.        . pravastatin (PRAVACHOL) 40 MG tablet Take 80 mg by mouth at bedtime.       Marland Kitchen warfarin (COUMADIN) 4 MG tablet Take 4 mg by mouth daily.       . potassium chloride SA (K-DUR,KLOR-CON) 20 MEQ tablet Take 2 tablets (40 mEq total) by mouth once.  2 tablet  0  . [DISCONTINUED] warfarin (COUMADIN) 7.5 MG tablet Take 7.5 mg by mouth daily. Patient takes on Fridays         Objective: Blood pressure 130/70, pulse 76, temperature 98.2 F (36.8 C), temperature source Oral, resp. rate 18, height 5'  (1.524 m), weight 160 lb 6.4 oz (72.757 kg). Patient is alert and in no acute distress. Conjunctiva is pink. Sclera is nonicteric Oropharyngeal mucosa is normal. No neck masses or thyromegaly noted. Abdomen is soft and nontender without organomegaly or masses.  No LE edema or clubbing noted.  Labs/studies Results:  H&H from 06/20/2012 is 11.5 and 35.9. MCV 72.1. Platelet count 373K  Assessment:  History of iron deficiency anemia secondary to GI bleed. She has undergone extensive workup in the past as noted above. She is presently asymptomatic her hemoglobin was 11.5 on 06/20/2012. She has not experienced melena or frank rectal bleeding since her last visit GERD symptoms are well controlled with therapy. Unless she has recurrent bleed she will not need further workup.   Plan:  Patient advised to continue iron pills twice daily. She will go to the lab for CBC and serum ferritin. If H&H is normal will follow patient on as-needed basis. Patient advised to call office should she experience melena or rectal bleeding.

## 2012-10-17 NOTE — Patient Instructions (Addendum)
Physician will contact you with results of blood work. 

## 2012-10-26 DIAGNOSIS — I4949 Other premature depolarization: Secondary | ICD-10-CM | POA: Diagnosis not present

## 2012-10-26 DIAGNOSIS — R002 Palpitations: Secondary | ICD-10-CM | POA: Diagnosis not present

## 2012-10-26 DIAGNOSIS — I4891 Unspecified atrial fibrillation: Secondary | ICD-10-CM | POA: Diagnosis not present

## 2012-10-30 ENCOUNTER — Encounter (INDEPENDENT_AMBULATORY_CARE_PROVIDER_SITE_OTHER): Payer: Self-pay

## 2012-11-30 DIAGNOSIS — I4949 Other premature depolarization: Secondary | ICD-10-CM | POA: Diagnosis not present

## 2012-11-30 DIAGNOSIS — I4891 Unspecified atrial fibrillation: Secondary | ICD-10-CM | POA: Diagnosis not present

## 2012-11-30 DIAGNOSIS — R002 Palpitations: Secondary | ICD-10-CM | POA: Diagnosis not present

## 2012-12-20 DIAGNOSIS — I4949 Other premature depolarization: Secondary | ICD-10-CM | POA: Diagnosis not present

## 2012-12-20 DIAGNOSIS — I4891 Unspecified atrial fibrillation: Secondary | ICD-10-CM | POA: Diagnosis not present

## 2012-12-20 DIAGNOSIS — R002 Palpitations: Secondary | ICD-10-CM | POA: Diagnosis not present

## 2013-01-04 DIAGNOSIS — D649 Anemia, unspecified: Secondary | ICD-10-CM | POA: Diagnosis not present

## 2013-01-04 DIAGNOSIS — I10 Essential (primary) hypertension: Secondary | ICD-10-CM | POA: Diagnosis not present

## 2013-01-04 DIAGNOSIS — E785 Hyperlipidemia, unspecified: Secondary | ICD-10-CM | POA: Diagnosis not present

## 2013-01-04 DIAGNOSIS — R109 Unspecified abdominal pain: Secondary | ICD-10-CM | POA: Diagnosis not present

## 2013-01-04 DIAGNOSIS — I4891 Unspecified atrial fibrillation: Secondary | ICD-10-CM | POA: Diagnosis not present

## 2013-02-01 DIAGNOSIS — I2510 Atherosclerotic heart disease of native coronary artery without angina pectoris: Secondary | ICD-10-CM | POA: Diagnosis not present

## 2013-02-01 DIAGNOSIS — I119 Hypertensive heart disease without heart failure: Secondary | ICD-10-CM | POA: Diagnosis not present

## 2013-02-01 DIAGNOSIS — E785 Hyperlipidemia, unspecified: Secondary | ICD-10-CM | POA: Diagnosis not present

## 2013-02-01 DIAGNOSIS — I4891 Unspecified atrial fibrillation: Secondary | ICD-10-CM | POA: Diagnosis not present

## 2013-02-01 DIAGNOSIS — E782 Mixed hyperlipidemia: Secondary | ICD-10-CM | POA: Diagnosis not present

## 2013-02-01 DIAGNOSIS — E119 Type 2 diabetes mellitus without complications: Secondary | ICD-10-CM | POA: Diagnosis not present

## 2013-02-20 DIAGNOSIS — E785 Hyperlipidemia, unspecified: Secondary | ICD-10-CM | POA: Diagnosis not present

## 2013-02-20 DIAGNOSIS — I119 Hypertensive heart disease without heart failure: Secondary | ICD-10-CM | POA: Diagnosis not present

## 2013-02-20 DIAGNOSIS — I4891 Unspecified atrial fibrillation: Secondary | ICD-10-CM | POA: Diagnosis not present

## 2013-02-20 DIAGNOSIS — I2510 Atherosclerotic heart disease of native coronary artery without angina pectoris: Secondary | ICD-10-CM | POA: Diagnosis not present

## 2013-03-05 DIAGNOSIS — M25549 Pain in joints of unspecified hand: Secondary | ICD-10-CM | POA: Diagnosis not present

## 2013-03-05 DIAGNOSIS — M790 Rheumatism, unspecified: Secondary | ICD-10-CM | POA: Diagnosis not present

## 2013-03-14 DIAGNOSIS — M25549 Pain in joints of unspecified hand: Secondary | ICD-10-CM | POA: Diagnosis not present

## 2013-03-20 DIAGNOSIS — E785 Hyperlipidemia, unspecified: Secondary | ICD-10-CM | POA: Diagnosis not present

## 2013-03-20 DIAGNOSIS — I119 Hypertensive heart disease without heart failure: Secondary | ICD-10-CM | POA: Diagnosis not present

## 2013-03-20 DIAGNOSIS — I4891 Unspecified atrial fibrillation: Secondary | ICD-10-CM | POA: Diagnosis not present

## 2013-03-20 DIAGNOSIS — I2510 Atherosclerotic heart disease of native coronary artery without angina pectoris: Secondary | ICD-10-CM | POA: Diagnosis not present

## 2013-03-21 DIAGNOSIS — Z853 Personal history of malignant neoplasm of breast: Secondary | ICD-10-CM | POA: Diagnosis not present

## 2013-04-03 DIAGNOSIS — I890 Lymphedema, not elsewhere classified: Secondary | ICD-10-CM | POA: Diagnosis not present

## 2013-04-03 DIAGNOSIS — C50919 Malignant neoplasm of unspecified site of unspecified female breast: Secondary | ICD-10-CM | POA: Diagnosis not present

## 2013-04-16 ENCOUNTER — Encounter (HOSPITAL_COMMUNITY)
Admission: EM | Disposition: A | Discharge status: Home Health | Payer: Self-pay | Source: Home / Self Care | Attending: Internal Medicine

## 2013-04-16 SURGERY — EGD (ESOPHAGOGASTRODUODENOSCOPY)
Anesthesia: Moderate Sedation

## 2013-04-20 ENCOUNTER — Inpatient Hospital Stay (HOSPITAL_COMMUNITY): Payer: Medicare Other

## 2013-04-20 ENCOUNTER — Inpatient Hospital Stay (HOSPITAL_COMMUNITY)
Admission: EM | Admit: 2013-04-20 | Discharge: 2013-04-24 | DRG: 377 | Disposition: A | Discharge status: Home Health | Payer: Medicare Other | Attending: Internal Medicine | Admitting: Internal Medicine

## 2013-04-20 ENCOUNTER — Emergency Department (HOSPITAL_COMMUNITY): Payer: Medicare Other

## 2013-04-20 ENCOUNTER — Encounter (HOSPITAL_COMMUNITY): Payer: Self-pay | Admitting: Emergency Medicine

## 2013-04-20 DIAGNOSIS — I509 Heart failure, unspecified: Secondary | ICD-10-CM | POA: Diagnosis present

## 2013-04-20 DIAGNOSIS — I10 Essential (primary) hypertension: Secondary | ICD-10-CM | POA: Diagnosis not present

## 2013-04-20 DIAGNOSIS — I2510 Atherosclerotic heart disease of native coronary artery without angina pectoris: Secondary | ICD-10-CM | POA: Diagnosis not present

## 2013-04-20 DIAGNOSIS — R0902 Hypoxemia: Secondary | ICD-10-CM | POA: Diagnosis present

## 2013-04-20 DIAGNOSIS — Z87891 Personal history of nicotine dependence: Secondary | ICD-10-CM

## 2013-04-20 DIAGNOSIS — R079 Chest pain, unspecified: Secondary | ICD-10-CM

## 2013-04-20 DIAGNOSIS — K921 Melena: Secondary | ICD-10-CM | POA: Diagnosis not present

## 2013-04-20 DIAGNOSIS — I4891 Unspecified atrial fibrillation: Secondary | ICD-10-CM | POA: Diagnosis not present

## 2013-04-20 DIAGNOSIS — Z9010 Acquired absence of unspecified breast and nipple: Secondary | ICD-10-CM

## 2013-04-20 DIAGNOSIS — K5521 Angiodysplasia of colon with hemorrhage: Principal | ICD-10-CM | POA: Diagnosis present

## 2013-04-20 DIAGNOSIS — K922 Gastrointestinal hemorrhage, unspecified: Secondary | ICD-10-CM | POA: Diagnosis not present

## 2013-04-20 DIAGNOSIS — Z6831 Body mass index (BMI) 31.0-31.9, adult: Secondary | ICD-10-CM

## 2013-04-20 DIAGNOSIS — E785 Hyperlipidemia, unspecified: Secondary | ICD-10-CM | POA: Diagnosis present

## 2013-04-20 DIAGNOSIS — D649 Anemia, unspecified: Secondary | ICD-10-CM

## 2013-04-20 DIAGNOSIS — D72829 Elevated white blood cell count, unspecified: Secondary | ICD-10-CM | POA: Diagnosis present

## 2013-04-20 DIAGNOSIS — R0602 Shortness of breath: Secondary | ICD-10-CM | POA: Diagnosis not present

## 2013-04-20 DIAGNOSIS — K219 Gastro-esophageal reflux disease without esophagitis: Secondary | ICD-10-CM | POA: Diagnosis present

## 2013-04-20 DIAGNOSIS — I059 Rheumatic mitral valve disease, unspecified: Secondary | ICD-10-CM | POA: Diagnosis not present

## 2013-04-20 DIAGNOSIS — R195 Other fecal abnormalities: Secondary | ICD-10-CM | POA: Diagnosis not present

## 2013-04-20 DIAGNOSIS — E669 Obesity, unspecified: Secondary | ICD-10-CM | POA: Diagnosis present

## 2013-04-20 DIAGNOSIS — Z66 Do not resuscitate: Secondary | ICD-10-CM | POA: Diagnosis present

## 2013-04-20 DIAGNOSIS — I5040 Unspecified combined systolic (congestive) and diastolic (congestive) heart failure: Secondary | ICD-10-CM

## 2013-04-20 DIAGNOSIS — I472 Ventricular tachycardia, unspecified: Secondary | ICD-10-CM | POA: Diagnosis present

## 2013-04-20 DIAGNOSIS — Z853 Personal history of malignant neoplasm of breast: Secondary | ICD-10-CM

## 2013-04-20 DIAGNOSIS — R0789 Other chest pain: Secondary | ICD-10-CM | POA: Diagnosis not present

## 2013-04-20 DIAGNOSIS — Z79899 Other long term (current) drug therapy: Secondary | ICD-10-CM | POA: Diagnosis not present

## 2013-04-20 DIAGNOSIS — Z951 Presence of aortocoronary bypass graft: Secondary | ICD-10-CM | POA: Diagnosis not present

## 2013-04-20 DIAGNOSIS — R0989 Other specified symptoms and signs involving the circulatory and respiratory systems: Secondary | ICD-10-CM | POA: Diagnosis not present

## 2013-04-20 DIAGNOSIS — Z7901 Long term (current) use of anticoagulants: Secondary | ICD-10-CM

## 2013-04-20 DIAGNOSIS — D509 Iron deficiency anemia, unspecified: Secondary | ICD-10-CM

## 2013-04-20 DIAGNOSIS — D62 Acute posthemorrhagic anemia: Secondary | ICD-10-CM | POA: Diagnosis present

## 2013-04-20 DIAGNOSIS — I252 Old myocardial infarction: Secondary | ICD-10-CM

## 2013-04-20 DIAGNOSIS — I5033 Acute on chronic diastolic (congestive) heart failure: Secondary | ICD-10-CM | POA: Diagnosis present

## 2013-04-20 DIAGNOSIS — I4729 Other ventricular tachycardia: Secondary | ICD-10-CM | POA: Diagnosis present

## 2013-04-20 DIAGNOSIS — R0609 Other forms of dyspnea: Secondary | ICD-10-CM | POA: Diagnosis not present

## 2013-04-20 DIAGNOSIS — R0600 Dyspnea, unspecified: Secondary | ICD-10-CM

## 2013-04-20 LAB — COMPREHENSIVE METABOLIC PANEL
BUN: 9 mg/dL (ref 6–23)
Calcium: 9.6 mg/dL (ref 8.4–10.5)
Creatinine, Ser: 0.75 mg/dL (ref 0.50–1.10)
GFR calc Af Amer: >90 mL/min (ref >90)
GFR calc non Af Amer: 80 mL/min — ABNORMAL LOW (ref >90)
Glucose, Bld: 106 mg/dL — ABNORMAL HIGH (ref 70–99)
Sodium: 141 mEq/L (ref 135–145)
Total Protein: 7 g/dL (ref 6.0–8.3)

## 2013-04-20 LAB — CBC WITH DIFFERENTIAL/PLATELET
Eosinophils Absolute: 0.1 10*3/uL (ref 0.0–0.7)
Eosinophils Relative: 1 % (ref 0–5)
HCT: 28.7 % — ABNORMAL LOW (ref 36.0–46.0)
Lymphs Abs: 1.4 10*3/uL (ref 0.7–4.0)
MCH: 18.9 pg — ABNORMAL LOW (ref 26.0–34.0)
MCV: 63.1 fL — ABNORMAL LOW (ref 78.0–100.0)
Monocytes Absolute: 1.1 10*3/uL — ABNORMAL HIGH (ref 0.1–1.0)
Monocytes Relative: 13 % — ABNORMAL HIGH (ref 3–12)
Platelets: 406 10*3/uL — ABNORMAL HIGH (ref 150–400)
RBC: 4.55 MIL/uL (ref 3.87–5.11)

## 2013-04-20 LAB — TROPONIN I: Troponin I: <0.3 ng/mL (ref <0.30)

## 2013-04-20 LAB — PRO B NATRIURETIC PEPTIDE: Pro B Natriuretic peptide (BNP): 674.3 pg/mL — ABNORMAL HIGH (ref 0–450)

## 2013-04-20 LAB — LIPID PANEL
LDL Cholesterol: 93 mg/dL (ref 0–99)
VLDL: 23 mg/dL (ref 0–40)

## 2013-04-20 LAB — APTT: aPTT: 42 seconds — ABNORMAL HIGH (ref 24–37)

## 2013-04-20 LAB — PROTIME-INR
INR: 2.03 — ABNORMAL HIGH (ref 0.00–1.49)
Prothrombin Time: 22.1 seconds — ABNORMAL HIGH (ref 11.6–15.2)

## 2013-04-20 MED ORDER — SENNA 8.6 MG PO TABS
1.0000 | ORAL_TABLET | Freq: Two times a day (BID) | ORAL | Status: DC
  Administered 2013-04-20 – 2013-04-24 (×7): 8.6 mg via ORAL
  Filled 2013-04-20 (×8): qty 1

## 2013-04-20 MED ORDER — FUROSEMIDE 10 MG/ML IJ SOLN: 60.0000 mg | Freq: Once | INTRAMUSCULAR | Status: DC

## 2013-04-20 MED ORDER — SODIUM CHLORIDE 0.9 % IJ SOLN
3.0000 mL | Freq: Two times a day (BID) | INTRAMUSCULAR | Status: DC
  Administered 2013-04-21 – 2013-04-23 (×4): 3 mL via INTRAVENOUS

## 2013-04-20 MED ORDER — NITROGLYCERIN 2 % TD OINT
1.0000 [in_us] | TOPICAL_OINTMENT | Freq: Once | TRANSDERMAL | Status: AC
  Administered 2013-04-20: 1 [in_us] via TOPICAL
  Filled 2013-04-20: qty 1

## 2013-04-20 MED ORDER — SODIUM CHLORIDE 0.9 % IV SOLN
250.0000 mL | INTRAVENOUS | Status: DC | PRN
  Administered 2013-04-20: 1000 mL via INTRAVENOUS

## 2013-04-20 MED ORDER — ONDANSETRON HCL 4 MG PO TABS: 4.0000 mg | ORAL_TABLET | Freq: Four times a day (QID) | ORAL | Status: DC | PRN

## 2013-04-20 MED ORDER — SIMVASTATIN 10 MG PO TABS
5.0000 mg | ORAL_TABLET | Freq: Every day | ORAL | Status: DC
  Administered 2013-04-21 – 2013-04-23 (×3): 5 mg via ORAL
  Filled 2013-04-20 (×4): qty 1

## 2013-04-20 MED ORDER — ALUM & MAG HYDROXIDE-SIMETH 200-200-20 MG/5ML PO SUSP: 30.0000 mL | Freq: Four times a day (QID) | ORAL | Status: DC | PRN

## 2013-04-20 MED ORDER — POTASSIUM CHLORIDE CRYS ER 20 MEQ PO TBCR
40.0000 meq | EXTENDED_RELEASE_TABLET | Freq: Once | ORAL | Status: AC
  Administered 2013-04-20: 40 meq via ORAL
  Filled 2013-04-20: qty 2

## 2013-04-20 MED ORDER — ENOXAPARIN SODIUM 40 MG/0.4ML ~~LOC~~ SOLN
40.0000 mg | SUBCUTANEOUS | Status: DC
  Administered 2013-04-20 – 2013-04-21 (×2): 40 mg via SUBCUTANEOUS
  Filled 2013-04-20 (×2): qty 0.4

## 2013-04-20 MED ORDER — PANTOPRAZOLE SODIUM 40 MG PO TBEC
40.0000 mg | DELAYED_RELEASE_TABLET | Freq: Every day | ORAL | Status: DC
  Administered 2013-04-20 – 2013-04-24 (×5): 40 mg via ORAL
  Filled 2013-04-20 (×5): qty 1

## 2013-04-20 MED ORDER — FUROSEMIDE 10 MG/ML IJ SOLN: 20.0000 mg | Freq: Once | INTRAMUSCULAR | Status: DC

## 2013-04-20 MED ORDER — ACETAMINOPHEN 650 MG RE SUPP: 650.0000 mg | Freq: Four times a day (QID) | RECTAL | Status: DC | PRN

## 2013-04-20 MED ORDER — BISACODYL 10 MG RE SUPP: 10.0000 mg | Freq: Every day | RECTAL | Status: DC | PRN

## 2013-04-20 MED ORDER — SODIUM CHLORIDE 0.9 % IJ SOLN: 3.0000 mL | INTRAMUSCULAR | Status: DC | PRN

## 2013-04-20 MED ORDER — ZOLPIDEM TARTRATE 5 MG PO TABS: 5.0000 mg | ORAL_TABLET | Freq: Every evening | ORAL | Status: DC | PRN

## 2013-04-20 MED ORDER — SODIUM CHLORIDE 0.9 % IJ SOLN
3.0000 mL | Freq: Two times a day (BID) | INTRAMUSCULAR | Status: DC
  Administered 2013-04-20 – 2013-04-21 (×2): 3 mL via INTRAVENOUS
  Filled 2013-04-20: qty 3

## 2013-04-20 MED ORDER — ONDANSETRON HCL 4 MG/2ML IJ SOLN: 4.0000 mg | Freq: Four times a day (QID) | INTRAMUSCULAR | Status: DC | PRN

## 2013-04-20 MED ORDER — ASPIRIN EC 325 MG PO TBEC: 325.0000 mg | DELAYED_RELEASE_TABLET | Freq: Every day | ORAL | Status: DC

## 2013-04-20 MED ORDER — NITROGLYCERIN 0.4 MG SL SUBL: 0.4000 mg | SUBLINGUAL_TABLET | SUBLINGUAL | Status: DC | PRN

## 2013-04-20 MED ORDER — MORPHINE SULFATE 4 MG/ML IJ SOLN
4.0000 mg | Freq: Once | INTRAMUSCULAR | Status: AC
  Administered 2013-04-20: 4 mg via INTRAVENOUS
  Filled 2013-04-20: qty 1

## 2013-04-20 MED ORDER — HYDROCODONE-ACETAMINOPHEN 5-325 MG PO TABS
1.0000 | ORAL_TABLET | ORAL | Status: DC | PRN
  Administered 2013-04-21 – 2013-04-23 (×3): 1 via ORAL
  Administered 2013-04-23: 2 via ORAL
  Administered 2013-04-24 (×2): 1 via ORAL
  Filled 2013-04-20 (×3): qty 1
  Filled 2013-04-20: qty 2
  Filled 2013-04-20 (×2): qty 1

## 2013-04-20 MED ORDER — ONDANSETRON HCL 4 MG/2ML IJ SOLN
4.0000 mg | Freq: Once | INTRAMUSCULAR | Status: AC
  Administered 2013-04-20: 4 mg via INTRAVENOUS
  Filled 2013-04-20: qty 2

## 2013-04-20 MED ORDER — METOPROLOL TARTRATE 50 MG PO TABS
50.0000 mg | ORAL_TABLET | Freq: Two times a day (BID) | ORAL | Status: DC
  Administered 2013-04-20 – 2013-04-24 (×9): 50 mg via ORAL
  Filled 2013-04-20 (×10): qty 1

## 2013-04-20 MED ORDER — MORPHINE SULFATE 2 MG/ML IJ SOLN
1.0000 mg | INTRAMUSCULAR | Status: DC | PRN
  Administered 2013-04-21 – 2013-04-23 (×3): 1 mg via INTRAVENOUS
  Filled 2013-04-20 (×3): qty 1

## 2013-04-20 MED ORDER — ACETAMINOPHEN 325 MG PO TABS: 650.0000 mg | ORAL_TABLET | Freq: Four times a day (QID) | ORAL | Status: DC | PRN

## 2013-04-20 MED ORDER — FUROSEMIDE 10 MG/ML IJ SOLN: 40.0000 mg | Freq: Two times a day (BID) | INTRAMUSCULAR | Status: DC

## 2013-04-20 NOTE — Progress Notes (Addendum)
04/20/13 1810 Patient has order for CT angio of chest to r/o PE. Nursing staff unable to obtain IV access near antecubital area for CT while pt in ER. Nursing supervisor assessed on admit to floor and ICU nurse attempted access x 2 this evening. Unable to obtain access d/t poor veins. Notified Dr. Karilyn Cota, discussed possibility of VQ scan instead of CT. Stated would place order, radiology stated could be obtained tonight with radiologist approval. Notified Dr. Karilyn Cota, stated could hold off for tonight and address in the morning. Discussed with patient, stated preferred no further IV sticks, would like to rest for the night. Earnstine Regal, RN  04/20/13 1818 Also notified Dr. Karilyn Cota, patient reported to have rare antibody per report from ER. Awaiting blood to be obtained for transfusion as ordered. Stated okay. Nursing staff to monitor. Earnstine Regal, RN

## 2013-04-20 NOTE — ED Provider Notes (Signed)
History  This chart was scribed for Ward Givens, MD by Jiles Prows, ED Scribe. The patient was seen in room APA14/APA14 and the patient's care was started at 9:40 AM.  CSN: [id number]  Arrival date & time 04/20/13  0929  Chief Complaint  Patient presents with  . Chest Pain  . Shortness of Breath   The history is provided by the patient and medical records. No language interpreter was used.  HPI Comments: Yesenia Woods is a 78 y.o. female who presents to the Emergency Department complaining of constant moderate chest pressure, but is also described as sharp and achy that has lasted for 3 days. She rates current pain as a 7-8/10. She describes intermittent central chest pain onset 8 days ago. The pain reached a 10/10 on Tuesday. Episodes were lasting for an hour or an hour and a half until three days ago when it became constant. She Woods that the pain does not radiate into shoulders or neck. Pt reports chromic SOB has worsened in the past ten days. Patient Woods she's had "ball" in her chest for years. She Woods she has chronic atrial fibrillation and gets palpitations at times.  Laying down, stretching out, and "being quiet" reportedly ease the pain.  Standing up and walking exacerbate the pain.  She describes the pain as sharp,achy, and like a "ball" or something sitting on her chest She reports feeling her heart beating really fast in the past few days which sometimes causes her to stagger when walking.    Pt is unsure if stool has been black lately.  Pt denies swelling of her extremities abdominal pain, diaphoresis, headache, diaphoresis, fever, chills, nausea, vomiting, diarrhea, weakness, feeling lightheaded cough, and any other pain.  Pt reports a few days she will start to feel better, but she will feel much worse in the night.  Pt denies history of CHF. She Woods she has had blood in her stools in the past.   Pt says she was "made to come in today."  She was trying to wait until her  appointment with Dr. Earna Coder on Wednesday in 5 days. Last night she reports she could not sleep due to pain.    Pt reports a h/o  triple bypass heart 26 years ago. She reports she has had stress test, echocardiogram and a cardiac cath to 3 years ago and was told they were all normal.  Pt denies smoking and drinks rarely.  Cardiologist is Dr Earna Coder in [place]. PCP DR Phineas Semen  Past Medical History  Diagnosis Date  . Myocardial infarct   . Cancer     breast  . Hypertension   . Shortness of breath   . Anemia   . Headache(784.0)   . Arthritis     Past Surgical History  Procedure Laterality Date  . Coronary artery bypass graft    . Eye surgery    . Breast surgery      Left mastectomy  . Right hand surgery    . Esophagogastroduodenoscopy  07/22/2011    Procedure: ESOPHAGOGASTRODUODENOSCOPY (EGD);  Surgeon: Malissa Hippo, MD;  Location: [facility];  Service: Endoscopy;  Laterality: N/A;  . Colonoscopy  07/22/2011    Procedure: COLONOSCOPY;  Surgeon: Malissa Hippo, MD;  Location: [facility];  Service: Endoscopy;  Laterality: N/A;  . Givens capsule study  07/30/2011    Procedure: GIVENS CAPSULE STUDY;  Surgeon: Malissa Hippo, MD;  Location: [facility];  Service: Endoscopy;  Laterality: N/A;  7:30    Family History  Problem Relation Age of Onset  . Prostate cancer Brother   . Prostate cancer Brother   . Breast cancer Daughter     Breast Cancer that mets  . Healthy Daughter   . Healthy Son   . Healthy Son   . Healthy Son   . Healthy Son   . Healthy Son     History  Substance Use Topics  . Smoking status: Former Smoker -- 1.00 packs/day for 30 years  . Smokeless tobacco: Never Used     Comment: Patient quit smoking 25 years ago  . Alcohol Use: 1.2 oz/week    1 Glasses of wine, 1 Cans of beer per week   lives at home Lives alone  OB History   Grav Para Term Preterm Abortions TAB SAB Ect Mult Living                 Review of Systems  Constitutional:  Negative for fever and chills.  HENT: Negative for congestion and sore throat.   Respiratory: Positive for chest tightness and shortness of breath.   Cardiovascular: Positive for chest pain.  Gastrointestinal: Negative for nausea, vomiting and diarrhea.  Genitourinary: Negative for dysuria and enuresis.  Musculoskeletal: Negative for back pain and gait problem.  Skin: Negative for pallor and rash.  All other systems reviewed and are negative.   Allergies  Review of patient's allergies indicates no known allergies.  Home Medications   Current Outpatient Rx  Name  Route  Sig  Dispense  Refill  . aspirin 81 MG tablet   Oral   Take 81 mg by mouth daily.         . ferrous sulfate 325 (65 FE) MG tablet   Oral   Take 325 mg by mouth as needed.         . metoprolol (LOPRESSOR) 50 MG tablet   Oral   Take 50 mg by mouth 2 (two) times daily.           Marland Kitchen omeprazole (PRILOSEC) 20 MG capsule   Oral   Take 20 mg by mouth daily.           Marland Kitchen EXPIRED: potassium chloride SA (K-DUR,KLOR-CON) 20 MEQ tablet   Oral   Take 2 tablets (40 mEq total) by mouth once.   2 tablet   0   . pravastatin (PRAVACHOL) 40 MG tablet   Oral   Take 80 mg by mouth at bedtime.          Marland Kitchen warfarin (COUMADIN) 4 MG tablet   Oral   Take 4 mg by mouth daily.            BP 166/63  Pulse 63  Temp(Src) 98.1 F (36.7 C) (Oral)  Resp 22  Wt 158 lb (71.668 kg)  BMI 30.86 kg/m2  SpO2 98%  Vital signs normal    Physical Exam  Nursing note and vitals reviewed. Constitutional: She is oriented to person, place, and time. She appears well-developed and well-nourished.  Non-toxic appearance. She does not appear ill. No distress.  HENT:  Head: Normocephalic and atraumatic.  Right Ear: External ear normal.  Left Ear: External ear normal.  Nose: Nose normal. No mucosal edema or rhinorrhea.  Mouth/Throat: Oropharynx is clear and moist and mucous membranes are normal. No dental abscesses or edematous.   Eyes: Conjunctivae and EOM are normal. Pupils are equal, round, and reactive to light.  Pale conjunctiva  Neck: Normal range of  motion and full passive range of motion without pain. Neck supple.  Cardiovascular: Normal rate, regular rhythm and normal heart sounds.  Exam reveals no gallop and no friction rub.   No murmur heard. HR irregularly irregular.  No tachycardia, no bradycardia.  Pulmonary/Chest: Effort normal and breath sounds normal. No respiratory distress. She has no wheezes. She has no rhonchi. She has no rales. She exhibits no tenderness and no crepitus.  Abdominal: Soft. Normal appearance and bowel sounds are normal. She exhibits no distension. There is no tenderness. There is no rebound and no guarding.  Musculoskeletal: Normal range of motion. She exhibits no edema and no tenderness.  Moves all extremities well.   Neurological: She is alert and oriented to person, place, and time. She has normal strength. No cranial nerve deficit.  Skin: Skin is warm, dry and intact. No rash noted. No erythema. There is pallor.   Pale palms and soles  Psychiatric: She has a normal mood and affect. Her speech is normal and behavior is normal. Her mood appears not anxious.    ED Course  Procedures (including critical care time)  Medications  furosemide (LASIX) injection 20 mg (not administered)  furosemide (LASIX) injection 60 mg (not administered)  nitroGLYCERIN (NITROGLYN) 2 % ointment 1 inch (1 inch Topical Given 04/20/13 1028)  morphine 4 MG/ML injection 4 mg (4 mg Intravenous Given 04/20/13 1028)  ondansetron (ZOFRAN) injection 4 mg (4 mg Intravenous Given 04/20/13 1028)    DIAGNOSTIC STUDIES: Oxygen Saturation is 98% on RA, normal by my interpretation.    COORDINATION OF CARE: 9:45 AM - Discussed ED treatment with pt at bedside and pt agrees.    12:59 PM - Discussed results from blood work including anemia.  Rectal exam. Old Hemorid tags noted. No obvious stool but only specks of  sample on the glove. Will admit.  Patient prepared to get a blood transfusion  13:48 Dr Karilyn Cota, admit to tele, team 2  Results for orders placed during the hospital encounter of 04/20/13  CBC WITH DIFFERENTIAL      Result Value Range   WBC 8.2  4.0 - 10.5 K/uL   RBC 4.55  3.87 - 5.11 MIL/uL   Hemoglobin 8.6 (*) 12.0 - 15.0 g/dL   HCT 16.1 (*) 09.6 - 04.5 %   MCV 63.1 (*) 78.0 - 100.0 fL   MCH 18.9 (*) 26.0 - 34.0 pg   MCHC 30.0  30.0 - 36.0 g/dL   RDW 40.9 (*) 81.1 - 91.4 %   Platelets 406 (*) 150 - 400 K/uL   Neutrophils Relative % 69  43 - 77 %   Neutro Abs 5.6  1.7 - 7.7 K/uL   Lymphocytes Relative 17  12 - 46 %   Lymphs Abs 1.4  0.7 - 4.0 K/uL   Monocytes Relative 13 (*) 3 - 12 %   Monocytes Absolute 1.1 (*) 0.1 - 1.0 K/uL   Eosinophils Relative 1  0 - 5 %   Eosinophils Absolute 0.1  0.0 - 0.7 K/uL   Basophils Relative 1  0 - 1 %   Basophils Absolute 0.0  0.0 - 0.1 K/uL  COMPREHENSIVE METABOLIC PANEL      Result Value Range   Sodium 141  135 - 145 mEq/L   Potassium 3.7  3.5 - 5.1 mEq/L   Chloride 104  96 - 112 mEq/L   CO2 26  19 - 32 mEq/L   Glucose, Bld 106 (*) 70 - 99 mg/dL   BUN  9  6 - 23 mg/dL   Creatinine, Ser 1.61  0.50 - 1.10 mg/dL   Calcium 9.6  8.4 - 09.6 mg/dL   Total Protein 7.0  6.0 - 8.3 g/dL   Albumin 3.5  3.5 - 5.2 g/dL   AST 22  0 - 37 U/L   ALT 23  0 - 35 U/L   Alkaline Phosphatase 89  39 - 117 U/L   Total Bilirubin 0.4  0.3 - 1.2 mg/dL   GFR calc non Af Amer 80 (*) >90 mL/min   GFR calc Af Amer >90  >90 mL/min  APTT      Result Value Range   aPTT 42 (*) 24 - 37 seconds  PROTIME-INR      Result Value Range   Prothrombin Time 22.1 (*) 11.6 - 15.2 seconds   INR 2.03 (*) 0.00 - 1.49  PRO B NATRIURETIC PEPTIDE      Result Value Range   Pro B Natriuretic peptide (BNP) 674.3 (*) 0 - 450 pg/mL  D-DIMER, QUANTITATIVE      Result Value Range   D-Dimer, Quant 1.57 (*) 0.00 - 0.48 ug/mL-FEU  POCT I-STAT TROPONIN I      Result Value Range    Troponin i, poc 0.00  0.00 - 0.08 ng/mL   Comment 3           OCCULT BLOOD, POC DEVICE      Result Value Range   Fecal Occult Bld POSITIVE (*) NEGATIVE    Laboratory interpretation all normal except therapeutic INR, anemia since December 2013 when her hemoglobin was 10, elevated d-dimer, positive Hemoccult   Dg Chest Portable 1 View  04/20/2013   *RADIOLOGY REPORT*  Clinical Data: Chest pain and shortness of breath.  PORTABLE CHEST - 1 VIEW  Comparison: PA and lateral chest 07/21/2011.  Findings: The patient is status post median sternotomy.  There is cardiomegaly and vascular congestion.  No consolidative process, pneumothorax or effusion.  IMPRESSION: Cardiomegaly and pulmonary vascular congestion.   Original Report Authenticated By: Holley Dexter, M.D.     Date: 04/20/2013  Rate: 67  Rhythm: atrial fibrillation  QRS Axis: right  Intervals: normal  ST/T Wave abnormalities: nonspecific ST/T changes  Conduction Disutrbances:ILBBB  Narrative Interpretation:   Old EKG Reviewed: unchanged from 07/18/2011     1. Anemia   2. GI bleed   3. Chest pain   4. Dyspnea on exertion   5. CHF (congestive heart failure), unspecified failure chronicity, combined     Plan admission   Devoria Albe, MD, FACEP   MDM   I personally performed the services described in this documentation, which was scribed in my presence. The recorded information has been reviewed and considered.  Devoria Albe, MD, Armando Gang    Ward Givens, MD 04/20/13 [phone]

## 2013-04-20 NOTE — ED Notes (Signed)
Recurrent chest pain for past week. Pt states also has SOB as well. Pain has been pretty constant this week.

## 2013-04-20 NOTE — H&P (Addendum)
[facility] History and Physical  Yesenia Woods ZOX:[id number] DOB: 1935/05/23 DOA: 04/20/2013  Referring physician:  PCP: Katherina Right, MD  Specialists:   Chief Complaint: Chest pain and shortness of breath  HPI: Yesenia Woods is a 78 y.o. female with past medical history including hypertension, CAD, A. fib GERD, GI  bleed, anemia, hyperlipidemia who presents to the emergency department with the chief complaint of chest pain. Information is obtained from the patient. She states that she has had intermittent chest pain and worsening shortness of breath over the last week. She reports the pain is located central chest and feels like a very heavy ball is sitting on her chest. She denies any radiation of this pain. She reports it reminded her of heartburn so she "took a pill" without relief. She denies nausea vomiting diaphoresis during this time. She does endorse some palpation. She also reports worsening shortness of breath. She denies orthopnea or lower extremity edema. She denies cough fever chills anorexia. She denies abdominal pain nausea vomiting diarrhea. She denies taking any NSAID. She denies melena. She denies dysuria hematuria. In the emergency room she was found to have a hemoglobin of 8.6, a chest x-ray with cardiomegaly and pulmonary vascular congestion a proBNP of 674, a d-dimer of 1.57. Patient states she has an appointment with her doctor next week and she was trying to wait until then but this shortness of breath worsened .  Review of Systems: 14 point review of systems  Past Medical History  Diagnosis Date  . Myocardial infarct   . Cancer     breast  . Hypertension   . Shortness of breath   . Anemia   . Headache(784.0)   . Arthritis    Past Surgical History  Procedure Laterality Date  . Coronary artery bypass graft    . Eye surgery    . Breast surgery      Left mastectomy  . Right hand surgery    . Esophagogastroduodenoscopy  07/22/2011    Procedure:  ESOPHAGOGASTRODUODENOSCOPY (EGD);  Surgeon: Malissa Hippo, MD;  Location: [facility];  Service: Endoscopy;  Laterality: N/A;  . Colonoscopy  07/22/2011    Procedure: COLONOSCOPY;  Surgeon: Malissa Hippo, MD;  Location: [facility];  Service: Endoscopy;  Laterality: N/A;  . Givens capsule study  07/30/2011    Procedure: GIVENS CAPSULE STUDY;  Surgeon: Malissa Hippo, MD;  Location: [facility];  Service: Endoscopy;  Laterality: N/A;  7:30   Social History:  reports that she has quit smoking. She has never used smokeless tobacco. She reports that she drinks about 1.2 ounces of alcohol per week. She reports that she does not use illicit drugs. *No Known Allergies Patient lives with her son she still drives an automobile and is independent with her ADL   Family History  Problem Relation Age of Onset  . Prostate cancer Brother   . Prostate cancer Brother   . Breast cancer Daughter     Breast Cancer that mets  . Healthy Daughter   . Healthy Son   . Healthy Son   . Healthy Son   . Healthy Son   . Healthy Son      Prior to Admission medications   Medication Sig Start Date End Date Taking? Authorizing Provider  aspirin 81 MG tablet Take 81 mg by mouth daily.   Yes Historical Provider, MD  metoprolol (LOPRESSOR) 50 MG tablet Take 50 mg by mouth 2 (two) times daily.  Yes Historical Provider, MD  omeprazole (PRILOSEC) 20 MG capsule Take 20 mg by mouth daily.     Yes Historical Provider, MD  pravastatin (PRAVACHOL) 40 MG tablet Take 80 mg by mouth at bedtime.    Yes Historical Provider, MD  warfarin (COUMADIN) 4 MG tablet Take 4-7.5 mg by mouth daily. 7.5 mg on Monday and Friday. 4 mg Tues, Wed, Thurs, Sat, and Sun   Yes Historical Provider, MD  potassium chloride SA (K-DUR,KLOR-CON) 20 MEQ tablet Take 2 tablets (40 mEq total) by mouth once. 07/23/11 07/22/12  Wilson Singer, MD   Physical Exam: Filed Vitals:   04/20/13 0934 04/20/13 1305 04/20/13 1357  BP: 166/63 158/75 169/68   Pulse: 63 67 63  Temp: 98.1 F (36.7 C)    TempSrc: Oral    Resp: 22 20 20   Weight: 71.668 kg (158 lb)    SpO2: 98% 94% 75%     General:  Well nourished no acute distress  Eyes: PERRL, EOMI  ENT: Ears clear nose without drainage oropharynx somewhat pale moist no erythema or exudate very poor dentition  Neck: supple no JVD no lymphadenopathy   Cardiovascular: irregularly irregular , no murmur gallop or rub no lower extremity edema   Respiratory: mild increased work of breathing with conversation, breath sounds clear bilaterally, no wheeze no crackles   Abdomen: obese soft positive bowel sounds nontender to palpation  Skin: somewhat pale no rash no lesion   Musculoskeletal: no clubbing no cyanosis   Psychiatric: calm cooperative   Neurologic: cranial nerves II through XII intact speech clear   Labs on Admission:  Basic Metabolic Panel:  Recent Labs Lab 04/20/13 0956  NA 141  K 3.7  CL 104  CO2 26  GLUCOSE 106*  BUN 9  CREATININE 0.75  CALCIUM 9.6   Liver Function Tests:  Recent Labs Lab 04/20/13 0956  AST 22  ALT 23  ALKPHOS 89  BILITOT 0.4  PROT 7.0  ALBUMIN 3.5   No results found for this basename: LIPASE, AMYLASE,  in the last 168 hours No results found for this basename: AMMONIA,  in the last 168 hours CBC:  Recent Labs Lab 04/20/13 0956  WBC 8.2  NEUTROABS 5.6  HGB 8.6*  HCT 28.7*  MCV 63.1*  PLT 406*   Cardiac Enzymes: No results found for this basename: CKTOTAL, CKMB, CKMBINDEX, TROPONINI,  in the last 168 hours  BNP (last 3 results)  Recent Labs  04/20/13 0956  PROBNP 674.3*   CBG: No results found for this basename: GLUCAP,  in the last 168 hours  Radiological Exams on Admission: Dg Chest Portable 1 View  04/20/2013   *RADIOLOGY REPORT*  Clinical Data: Chest pain and shortness of breath.  PORTABLE CHEST - 1 VIEW  Comparison: PA and lateral chest 07/21/2011.  Findings: The patient is status post median sternotomy.  There  is cardiomegaly and vascular congestion.  No consolidative process, pneumothorax or effusion.  IMPRESSION: Cardiomegaly and pulmonary vascular congestion.   Original Report Authenticated By: Holley Dexter, M.D.    EKG: Independently reviewed. A. fib with incomplete left bundle branch block   Assessment/Plan Principal Problem:   Dyspnea: Likely multi-factorial. X-ray with vascular congestion in setting of anemia and A. Fib. Patient with history of fluid overload in setting of anemia. Will admit to telemetry. Will obtain a 2-D echo. Will maintain strict intake and output and obtain daily weight. Will transfuse 2 units of packed red blood cells. Will provide oxygen support to maintain  saturation levels above 90%. Patient also with elevated d-dimer and 1 episode of hypoxia in the emergency room. Concern for PE as well. Will obtain CT angio of chest. Active Problems: Chest pain: A typical. Will cycle cardiac enzymes. Will obtain 2-D echo. Will repeat EKG in the morning. Will provide NTG and morphine as needed for chest pain. Patient with history of CAD. Will hold aspirin for now due to anemia and concern for GI bleed continue statin  Anemia: Patient with history of iron deficiency anemia secondary to GI bleed. Chart review indicates hemoglobin 12.0 6 months ago. Currently hemoglobin 8.6. Stool+ for occult blood. Will transfuse 2 units of packed red blood cells. Patient denies bright red blood parenchyma or melena. Will hold Coumadin for now. Will request GI consult.     CAD (coronary artery disease): See #2    Atrial fibrillation: Rate controlled. Patient on Coumadin INR 2.03. Patient on Coumadin at home will hold for now given concern for GI bleed     GERD (gastroesophageal reflux disease): Stable at baseline will continue PPI    Hyperlipidemia: Will check lipid panel continue statin       HTN (hypertension): Controlled. Systolic blood pressure range [id number]. continue home beta blocker.       Code Status: DNR Family Communication: son at bedside Disposition Plan: home when ready  Time spent: 43 minutes  [facility]   If 7PM-7AM, please contact night-coverage [email or web address] Password [facility] 04/20/2013, 2:27 PM Attending: Patient seen and examined. Above note  reviewed. Her presentation is not quite clear to me regarding congestive heart failure. I wonder whether she has a pulmonary embolism despite being on anticoagulation. She could have dyspnea secondary to her anemia and her CBC indicates probably an acute or subacute GI bleed. Her physical findings show clear lungs, jugular venous pressure but is not significantly elevated and there is no right ventricular heave. There is no peripheral pitting edema. She does have atrial fibrillation with a rate that is controlled. Agree with blood transfusion. Agree with CT angiogram of the chest. Gastroenterology consultation has been requested. Hold Coumadin.

## 2013-04-20 NOTE — ED Notes (Signed)
Echo cardiogram completed, pt tolerated well.

## 2013-04-20 NOTE — Progress Notes (Signed)
*  PRELIMINARY RESULTS* Echocardiogram 2D Echocardiogram has been performed.  Conrad [place] 04/20/2013, 3:05 PM

## 2013-04-21 ENCOUNTER — Encounter (HOSPITAL_COMMUNITY): Payer: Self-pay

## 2013-04-21 ENCOUNTER — Inpatient Hospital Stay (HOSPITAL_COMMUNITY): Payer: Medicare Other

## 2013-04-21 DIAGNOSIS — R0602 Shortness of breath: Secondary | ICD-10-CM | POA: Diagnosis not present

## 2013-04-21 DIAGNOSIS — I4891 Unspecified atrial fibrillation: Secondary | ICD-10-CM | POA: Diagnosis not present

## 2013-04-21 DIAGNOSIS — R195 Other fecal abnormalities: Secondary | ICD-10-CM

## 2013-04-21 DIAGNOSIS — R079 Chest pain, unspecified: Secondary | ICD-10-CM | POA: Diagnosis not present

## 2013-04-21 DIAGNOSIS — K922 Gastrointestinal hemorrhage, unspecified: Secondary | ICD-10-CM

## 2013-04-21 DIAGNOSIS — R0989 Other specified symptoms and signs involving the circulatory and respiratory systems: Secondary | ICD-10-CM | POA: Diagnosis not present

## 2013-04-21 DIAGNOSIS — D649 Anemia, unspecified: Secondary | ICD-10-CM | POA: Diagnosis not present

## 2013-04-21 LAB — PROTIME-INR
INR: 1.81 — ABNORMAL HIGH (ref 0.00–1.49)
Prothrombin Time: 20.3 seconds — ABNORMAL HIGH (ref 11.6–15.2)

## 2013-04-21 LAB — BASIC METABOLIC PANEL
Calcium: 9.3 mg/dL (ref 8.4–10.5)
GFR calc Af Amer: >90 mL/min (ref >90)
GFR calc non Af Amer: 81 mL/min — ABNORMAL LOW (ref >90)
Sodium: 139 mEq/L (ref 135–145)

## 2013-04-21 LAB — CBC
MCH: 20.8 pg — ABNORMAL LOW (ref 26.0–34.0)
Platelets: 336 10*3/uL (ref 150–400)
RBC: 5.01 MIL/uL (ref 3.87–5.11)
WBC: 9 10*3/uL (ref 4.0–10.5)

## 2013-04-21 LAB — TROPONIN I: Troponin I: <0.3 ng/mL (ref <0.30)

## 2013-04-21 LAB — TSH: TSH: 1.787 u[IU]/mL (ref 0.350–4.500)

## 2013-04-21 MED ORDER — TECHNETIUM TC 99M DIETHYLENETRIAME-PENTAACETIC ACID
40.0000 | Freq: Once | INTRAVENOUS | Status: AC | PRN
  Administered 2013-04-21: 40 via RESPIRATORY_TRACT

## 2013-04-21 MED ORDER — FUROSEMIDE 10 MG/ML IJ SOLN
40.0000 mg | Freq: Two times a day (BID) | INTRAMUSCULAR | Status: DC
  Administered 2013-04-21 – 2013-04-24 (×6): 40 mg via INTRAVENOUS
  Filled 2013-04-21 (×6): qty 4

## 2013-04-21 MED ORDER — TECHNETIUM TO 99M ALBUMIN AGGREGATED
6.0000 | Freq: Once | INTRAVENOUS | Status: AC | PRN
  Administered 2013-04-21: 6 via INTRAVENOUS

## 2013-04-21 NOTE — Progress Notes (Signed)
NT reports elevated BP but states pt had just got up to go to restroom. Will continue to monitor.

## 2013-04-21 NOTE — Progress Notes (Signed)
[facility] PROGRESS NOTE  Yesenia Woods Yesenia Woods:[id number] DOB: [birth year] DOA: 04/20/2013 PCP: Katherina Right, MD  Assessment/Plan: 1. Dyspnea.  Possibly related to anemia, could also be an element of CHF. V/Q scan was done and is low prob for PE. Echocardiogram done shows normal EF.  Will give lasix and observe for response. 2. Anemia.  Possibly related to GI losses.  She has heme positive stools.  GI has been consulted, appreciate assistance. No plans on endoscopy urgently.  Hemoglobin has improved with 2 unit prbc.  Will continue to monitor. 3. History of atrial fib.  Coumadin currently on hold.  She is rate controlled. 4. CAD.  Cardiac markers are negative.  Will continue to follow. 5. HTN.  Stable, continue to follow. 6. Bradycardia.  Patient is asymptomatic.  Review of vitals indicate that she is mostly in 67s. 7. NSVT. Patient has intermittent bursts of NSVT.  Asymptomatic.  Continue BB.  Code Status: DNR Family Communication: discussed with patient Disposition Plan: discharge home once improved   Consultants:  gastroenterology  Procedures:  none  Antibiotics:  none  HPI/Subjective: Still feels short of breath and weak. No signs of bleeding.  Objective: Filed Vitals:   04/21/13 0415 04/21/13 0500 04/21/13 0603 04/21/13 1417  BP: 168/78  175/88 156/74  Pulse:   53 68  Temp:   98 F (36.7 C) 98 F (36.7 C)  TempSrc:   Oral Oral  Resp:   18 18  Height:      Weight:  76.2 kg (167 lb 15.9 oz)    SpO2:   98% 100%    Intake/Output Summary (Last 24 hours) at 04/21/13 1600 Last data filed at 04/21/13 1559  Gross per 24 hour  Intake    811 ml  Output   1000 ml  Net   -189 ml   Filed Weights   04/20/13 0934 04/20/13 1607 04/21/13 0500  Weight: 71.668 kg (158 lb) 73.5 kg (162 lb 0.6 oz) 76.2 kg (167 lb 15.9 oz)    Exam:   General:  NAD  Cardiovascular: S1, S2 RRR  Respiratory: diminished breath sounds, crackles at bases  Abdomen: soft, nt, nd,  bs+  Musculoskeletal: 1+ edema b/l   Data Reviewed: Basic Metabolic Panel:  Recent Labs Lab 04/20/13 0956 04/21/13 0739  NA 141 139  K 3.7 4.2  CL 104 104  CO2 26 24  GLUCOSE 106* 96  BUN 9 9  CREATININE 0.75 0.73  CALCIUM 9.6 9.3   Liver Function Tests:  Recent Labs Lab 04/20/13 0956  AST 22  ALT 23  ALKPHOS 89  BILITOT 0.4  PROT 7.0  ALBUMIN 3.5   No results found for this basename: LIPASE, AMYLASE,  in the last 168 hours No results found for this basename: AMMONIA,  in the last 168 hours CBC:  Recent Labs Lab 04/20/13 0956 04/21/13 0739  WBC 8.2 9.0  NEUTROABS 5.6  --   HGB 8.6* 10.4*  HCT 28.7* 33.4*  MCV 63.1* 66.7*  PLT 406* 336   Cardiac Enzymes:  Recent Labs Lab 04/20/13 1418 04/20/13 2022 04/21/13 0739  TROPONINI <0.30 <0.30 <0.30   BNP (last 3 results)  Recent Labs  04/20/13 0956  PROBNP 674.3*   CBG: No results found for this basename: GLUCAP,  in the last 168 hours  No results found for this or any previous visit (from the past 240 hour(s)).   Studies: Nm Pulmonary Perf And Vent  04/21/2013   *RADIOLOGY REPORT*  Clinical Data:  Shortness of breath and chest pain.  Evaluate for pulmonary embolism.  NM PULMONARY VENTILATION AND PERFUSION SCAN  Radiopharmaceutical: CURIE MAA TECHNETIUM TO 2M ALBUMIN AGGREGATED 40 mCi  Comparison: No priors.  Findings: Perfusion imaging demonstrates some subsegmental sized defects in the left upper lobe.  Perfusion is otherwise normal. Ventilation imaging is essentially nondiagnostic secondary to aggregation of radiotracer in the proximal bronchi which results in extensive artifact.  IMPRESSION: 1.  Despite the limitations of the ventilation portion of the examination, the perfusion portion of the examination is most compatible with a very low probability (0-9% chance) of pulmonary embolism.   Original Report Authenticated By: Trudie Reed, M.D.   Dg Chest Portable 1 View  04/20/2013    *RADIOLOGY REPORT*  Clinical Data: Chest pain and shortness of breath.  PORTABLE CHEST - 1 VIEW  Comparison: PA and lateral chest 07/21/2011.  Findings: The patient is status post median sternotomy.  There is cardiomegaly and vascular congestion.  No consolidative process, pneumothorax or effusion.  IMPRESSION: Cardiomegaly and pulmonary vascular congestion.   Original Report Authenticated By: Holley Dexter, M.D.    Scheduled Meds: . enoxaparin (LOVENOX) injection  40 mg Subcutaneous Q24H  . metoprolol  50 mg Oral BID  . pantoprazole  40 mg Oral Daily  . senna  1 tablet Oral BID  . simvastatin  5 mg Oral q1800  . sodium chloride  3 mL Intravenous Q12H  . sodium chloride  3 mL Intravenous Q12H   Continuous Infusions:   Principal Problem:   Dyspnea Active Problems:   Anemia   CAD (coronary artery disease)   HTN (hypertension)   Chest pain   Atrial fibrillation   GERD (gastroesophageal reflux disease)   Hyperlipidemia   Hypoxia    Time spent:    Yesenia Woods  [facility] Pager [phone]. If 7PM-7AM, please contact night-coverage at [email or web address], password [facility] 04/21/2013, 4:00 PM  LOS: 1 day

## 2013-04-21 NOTE — Consult Note (Signed)
Referring Provider:  Dr. Karilyn Cota Primary Care Physician:  Katherina Right, MD [facility];  cardiologist Dr. Clent Ridges  Primary Gastroenterologist:  Dr. Karilyn Cota  Reason for Consultation:  Pleasant 78 year old African American lady admitted to the ED yesterday with progressive fatigue and vague chest pain along with a hemoglobin of 8.6 along with Hemoccult positive stool. Patient has not had any hematemesis, melena or hematochezia. She's been transfused with 2 units of packed red blood cells overnight. This morning her hemoglobin is 10.6. She remains hemodynamically stable.. She denies reflux symptoms on omeprazole 20 mg daily. She denies dysphagia, early satiety or abdominal pain. She states her appetite has been well maintained. She denies weight loss. D-dimer elevated; Spiral chest CT could not be done yesterday secondary  to lack of IV access. She's getting a VQ scan today. Coumadin has been held (INR 2.03 on admission) . She is on enoxaparin. She's had recurrent iron deficiency anemia secondary to GI bleeding and has been extensively worked up over the past couple of years. She's had EGD and colonoscopy x2 in the past 2 years; firstly about 21 months ago by Dr. Karilyn Cota. and by Dr. Samuella Cota in [place] in April of last year. Capsule endoscopy in 2012 demonstrated a duodenal bulbar ulcer (not appreciated on EGD immediately preceding capsule study. Patient also states that ulcer was present at the time of her EGD last year by Dr. Samuella Cota.  Patient denies any other OTC NSAIDs aside from the aspirin 81 mg she takes each day.     Past Medical History  Diagnosis Date  . Myocardial infarct   . Cancer     breast  . Hypertension   . Shortness of breath   . Anemia   . Headache(784.0)   . Arthritis     Past Surgical History  Procedure Laterality Date  . Coronary artery bypass graft    . Eye surgery    . Breast surgery      Left mastectomy  . Right hand surgery    .  Esophagogastroduodenoscopy  07/22/2011    Procedure: ESOPHAGOGASTRODUODENOSCOPY (EGD);  Surgeon: Malissa Hippo, MD;  [facility];  Service: Endoscopy;  Laterality: N/A;  . Colonoscopy  07/22/2011    Procedure: COLONOSCOPY;  Surgeon: Malissa Hippo, MD;  [facility];  Service: Endoscopy;  Laterality: N/A;  . Givens capsule study  07/30/2011    Procedure: GIVENS CAPSULE STUDY;  Surgeon: Malissa Hippo, MD;  Location: [facility];  Service: Endoscopy;  Laterality: N/A;  7:30    Prior to Admission medications   Medication Sig Start Date End Date Taking? Authorizing Provider  aspirin 81 MG tablet Take 81 mg by mouth daily.   Yes Historical Provider, MD  metoprolol (LOPRESSOR) 50 MG tablet Take 50 mg by mouth 2 (two) times daily.     Yes Historical Provider, MD  omeprazole (PRILOSEC) 20 MG capsule Take 20 mg by mouth daily.     Yes Historical Provider, MD  pravastatin (PRAVACHOL) 40 MG tablet Take 80 mg by mouth at bedtime.    Yes Historical Provider, MD  warfarin (COUMADIN) 4 MG tablet Take 4-7.5 mg by mouth daily. 7.5 mg on Monday and Friday. 4 mg Tues, Wed, Thurs, Sat, and Sun   Yes Historical Provider, MD  potassium chloride SA (K-DUR,KLOR-CON) 20 MEQ tablet Take 2 tablets (40 mEq total) by mouth once. 07/23/11 07/22/12  Wilson Singer, MD    Current Facility-Administered Medications  Medication Dose Route Frequency  Provider Last Rate Last Dose  . 0.9 %  sodium chloride infusion  250 mL Intravenous PRN Gwenyth Bender, NP 10 mL/hr at 04/20/13 1800 1,000 mL at 04/20/13 1800  . acetaminophen (TYLENOL) tablet 650 mg  650 mg Oral Q6H PRN Gwenyth Bender, NP       Or  . acetaminophen (TYLENOL) suppository 650 mg  650 mg Rectal Q6H PRN Gwenyth Bender, NP      . alum & mag hydroxide-simeth (MAALOX/MYLANTA) 200-200-20 MG/5ML suspension 30 mL  30 mL Oral Q6H PRN Gwenyth Bender, NP      . bisacodyl (DULCOLAX) suppository 10 mg  10 mg Rectal Daily PRN Gwenyth Bender, NP      . enoxaparin  (LOVENOX) injection 40 mg  40 mg Subcutaneous Q24H Gwenyth Bender, NP   40 mg at 04/20/13 1533  . HYDROcodone-acetaminophen (NORCO/VICODIN) 5-325 MG per tablet 1-2 tablet  1-2 tablet Oral Q4H PRN Gwenyth Bender, NP   1 tablet at 04/21/13 0150  . metoprolol (LOPRESSOR) tablet 50 mg  50 mg Oral BID Gwenyth Bender, NP   50 mg at 04/21/13 8469  . morphine 2 MG/ML injection 1 mg  1 mg Intravenous Q2H PRN Gwenyth Bender, NP      . nitroGLYCERIN (NITROSTAT) SL tablet 0.4 mg  0.4 mg Sublingual Q5 min PRN Gwenyth Bender, NP      . ondansetron [facility]) tablet 4 mg  4 mg Oral Q6H PRN Gwenyth Bender, NP       Or  . ondansetron [facility]) injection 4 mg  4 mg Intravenous Q6H PRN Gwenyth Bender, NP      . pantoprazole (PROTONIX) EC tablet 40 mg  40 mg Oral Daily Gwenyth Bender, NP   40 mg at 04/21/13 6295  . senna (SENOKOT) tablet 8.6 mg  1 tablet Oral BID Gwenyth Bender, NP   8.6 mg at 04/21/13 2841  . simvastatin (ZOCOR) tablet 5 mg  5 mg Oral q1800 Lesle Chris Black, NP      . sodium chloride 0.9 % injection 3 mL  3 mL Intravenous Q12H Gwenyth Bender, NP   3 mL at 04/20/13 1754  . sodium chloride 0.9 % injection 3 mL  3 mL Intravenous Q12H Gwenyth Bender, NP   3 mL at 04/21/13 0923  . sodium chloride 0.9 % injection 3 mL  3 mL Intravenous PRN Gwenyth Bender, NP      . zolpidem [facility]) tablet 5 mg  5 mg Oral QHS PRN Gwenyth Bender, NP        Allergies as of 04/20/2013  . (No Known Allergies)    Family History  Problem Relation Age of Onset  . Prostate cancer Brother   . Prostate cancer Brother   . Breast cancer Daughter     Breast Cancer that mets  . Healthy Daughter   . Healthy Son   . Healthy Son   . Healthy Son   . Healthy Son   . Healthy Son     History   Social History  . Marital Status: Widowed    Spouse Name: N/A    Number of Children: N/A  . Years of Education: N/A   Occupational History  . Not on file.   Social History Main Topics  . Smoking status: Former Smoker -- 1.00 packs/day for 30  years  . Smokeless tobacco: Never Used     Comment: Patient quit smoking  25 years ago  . Alcohol Use: 1.2 oz/week    1 Glasses of wine, 1 Cans of beer per week  . Drug Use: No  . Sexually Active: Not on file   Other Topics Concern  . Not on file   Social History Narrative  . No narrative on file    Additional social history: Widowed. Lives in [place]. Occasionally has a beer or glass of wine. She has 7 children. One deceased with metastatic "throat" cancer. 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  Review of Systems: Gen: Denies any fever, chills, sweats, anorexia,, weight loss, and sleep disorder CV:  syncope, orthopnea, and claudication. Resp: , cough, sputum, wheezing, coughing up blood, and pleurisy. GI: Denies vomiting blood, jaundice, and fecal incontinence.   Denies dysphagia or odynophagia. Derm: Denies rash, itching, dry skin, hives, moles, warts, or unhealing ulcers.  Psych: Denies depression, anxiety, memory loss, suicidal ideation, hallucinations, paranoia, and confusion. Heme: Denies bruising, bleeding, and enlarged lymph nodes.   Physical Exam: Vital signs in last 24 hours: Temp:  [97.6 F (36.4 C)-98.9 F (37.2 C)] 98 F (36.7 C) (06/07 0603) Pulse Rate:  [48-80] 53 (06/07 0603) Resp:  [18-21] 18 (06/07 0603) BP: (153-183)/(68-111) 175/88 mmHg (06/07 0603) SpO2:  [75 %-100 %] 98 % (06/07 0603) Weight:  [162 lb 0.6 oz (73.5 kg)-167 lb 15.9 oz (76.2 kg)] 167 lb 15.9 oz (76.2 kg) (06/07 0500) Last BM Date: 04/20/13 (per report) General:   Alert,  Well-developed, well-nourished, pleasant and cooperative in NAD.  This lady is pretty sharp and opinionated. Head:  Normocephalic and atraumatic. Eyes:  Sclera clear, no icterus.   Conjunctiva pink. Ears:  Normal auditory acuity. Nose:  No deformity, discharge,  or lesions. Mouth:  No deformity or lesions, dentition normal. Neck:  Supple; no masses or thyromegaly. Lungs:  Clear throughout to  auscultation.   No wheezes, crackles, or rhonchi. No acute distress. Heart:   irregularly irregular rhythm ;no murmurs, clicks, rubs,  or gallops. Abdomen:   nondistended. Positive bowel sounds. Abdomen is soft and nontender without appreciable mass or megaly  Msk:  Symmetrical without gross deformities. Normal posture. Pulses:  Normal pulses noted. Extremities:  Without clubbing or edema. Neurologic:  Alert and  oriented x4;  grossly normal neurologically. Skin:  Intact without significant lesions or rashes. Cervical Nodes:  No significant cervical adenopathy. Psych:  Alert and cooperative. Normal mood and affect.  Intake/Output from previous day: 06/06 0701 - 06/07 0700 In: 571 [I.V.:3; Blood:568] Out: -  Intake/Output this shift: Total I/O In: 120 [P.O.:120] Out: -   Lab Results:  Recent Labs  04/20/13 0956 04/21/13 0739  WBC 8.2 9.0  HGB 8.6* 10.4*  HCT 28.7* 33.4*  PLT 406* 336   BMET  Recent Labs  04/20/13 0956 04/21/13 0739  NA 141 139  K 3.7 4.2  CL 104 104  CO2 26 24  GLUCOSE 106* 96  BUN 9 9  CREATININE 0.75 0.73  CALCIUM 9.6 9.3   LFT  Recent Labs  04/20/13 0956  PROT 7.0  ALBUMIN 3.5  AST 22  ALT 23  ALKPHOS 89  BILITOT 0.4   PT/INR  Recent Labs  04/20/13 0956 04/21/13 0739  LABPROT 22.1* 20.3*  INR 2.03* 1.81*  \  Impression: Pleasant 78 year old lady from [place] admitted to the hospital with symptomatic anemia and Hemoccult-positive stool. No history of overt GI bleeding. She is virtually asymptomatic from a GI standpoint although she does describe some vague  chest discomfort or "indigestion". It is somewhat difficult to categorize. She does describes an exertional component, however. D-dimer elevated-further evaluation underway. Her typical reflux symptoms have been fairly well squelched with omeprazole 20 mg daily which she takes chronically. I find it interesting that the capsule study suggested a duodenal ulcer in 2012 and  patient describes being told by Dr. Samuella Cota in [place]  that the ulcer had not "grown" any on April 2013 EGD.    Recommendations: Agree with proton pump inhibitor therapy empirically and further evaluation of elevated d-dimer. At this time, I would not recommend an urgent endoscopic evaluation (patient is not keen on having one anyway). Given a duodenal ulcer seen on prior imaging, I do feel it would be reasonable to screen for H. pylori if it has not been done as this infection would be a risk factor for both iron deficiency anemia and peptic ulcer disease. I cannot find any data in the medical record regarding assessment for this infection. Therefore, we'll go ahead and order H. pylori serologies. I would also continue to reassess the risks versus benefits of antiplatelet/anticoagulation therapy in this nice lady. It still appears that the benefits of these therapies continue to outweigh the risk for the time being..  I have discussed my findings and recommendations with Dr. Kerry Hough.

## 2013-04-21 NOTE — Progress Notes (Signed)
Central telemetry notified that I am charting in pt's room at this time due to phone alarming. Will continue to monitor.

## 2013-04-21 NOTE — Progress Notes (Signed)
Patient sinus brady at time dropping to 40s and 30s, also patient is having pauses, one noted for 2.07. Dr. Kerry Hough notified.

## 2013-04-22 DIAGNOSIS — R079 Chest pain, unspecified: Secondary | ICD-10-CM | POA: Diagnosis not present

## 2013-04-22 DIAGNOSIS — I4891 Unspecified atrial fibrillation: Secondary | ICD-10-CM | POA: Diagnosis not present

## 2013-04-22 DIAGNOSIS — D649 Anemia, unspecified: Secondary | ICD-10-CM | POA: Diagnosis not present

## 2013-04-22 DIAGNOSIS — R0989 Other specified symptoms and signs involving the circulatory and respiratory systems: Secondary | ICD-10-CM | POA: Diagnosis not present

## 2013-04-22 DIAGNOSIS — K922 Gastrointestinal hemorrhage, unspecified: Secondary | ICD-10-CM | POA: Diagnosis not present

## 2013-04-22 LAB — TYPE AND SCREEN
Antibody Screen: POSITIVE
Donor AG Type: NEGATIVE
Unit division: 0

## 2013-04-22 LAB — CBC
HCT: 33.5 % — ABNORMAL LOW (ref 36.0–46.0)
MCHC: 31.3 g/dL (ref 30.0–36.0)
MCV: 66.7 fL — ABNORMAL LOW (ref 78.0–100.0)
RDW: 22.3 % — ABNORMAL HIGH (ref 11.5–15.5)

## 2013-04-22 LAB — BASIC METABOLIC PANEL
BUN: 9 mg/dL (ref 6–23)
Chloride: 102 mEq/L (ref 96–112)
Creatinine, Ser: 0.67 mg/dL (ref 0.50–1.10)
GFR calc Af Amer: >90 mL/min (ref >90)

## 2013-04-22 LAB — PROTIME-INR: Prothrombin Time: 17.2 seconds — ABNORMAL HIGH (ref 11.6–15.2)

## 2013-04-22 MED ORDER — POTASSIUM CHLORIDE CRYS ER 20 MEQ PO TBCR
40.0000 meq | EXTENDED_RELEASE_TABLET | Freq: Every day | ORAL | Status: DC
  Administered 2013-04-22 – 2013-04-24 (×3): 40 meq via ORAL
  Filled 2013-04-22 (×3): qty 2

## 2013-04-22 MED ORDER — HYDRALAZINE HCL 25 MG PO TABS
25.0000 mg | ORAL_TABLET | Freq: Three times a day (TID) | ORAL | Status: DC
  Administered 2013-04-22 – 2013-04-24 (×5): 25 mg via ORAL
  Filled 2013-04-22 (×6): qty 1

## 2013-04-22 NOTE — Progress Notes (Addendum)
Subjective: Patient states she slept well last last night after receiving a shot of morphine for "chest pain". Describes something "balling up" behind her breastbone. Hasn't been very hungry. Denies overt dysphagia. Denies  abdominal  pain.  No melena or hematemesis.   VQ scan low probability for pulmonary embolus  Objective: Vital signs in last 24 hours: Temp:  [98 F (36.7 C)-98.6 F (37 C)] 98.6 F (37 C) (06/08 1610) Pulse Rate:  [60-68] 66 (06/08 0608) Resp:  [16-18] 18 (06/08 0608) BP: (156-187)/(67-101) 187/101 mmHg (06/08 0608) SpO2:  [96 %-100 %] 96 % (06/08 0608) Weight:  [162 lb 7.7 oz (73.7 kg)] 162 lb 7.7 oz (73.7 kg) (06/08 0458) Last BM Date: 04/20/13 General:   Alert,  Well-developed, well-nourished, pleasant and cooperative in NAD Abdomen:    Normal bowel sounds, soft and nontender, without guarding, and without rebound.  No mass or organomegaly. Extremities:  Without clubbing or edema.    Intake/Output from previous day: 06/07 0701 - 06/08 0700 In: 486.5 [P.O.:240; I.V.:246.5] Out: 3500 [Urine:3500] Intake/Output this shift:    Lab Results:  Recent Labs  04/20/13 0956 04/21/13 0739 04/22/13 0725  WBC 8.2 9.0 10.2  HGB 8.6* 10.4* 10.5*  HCT 28.7* 33.4* 33.5*  PLT 406* 336 369   BMET  Recent Labs  04/20/13 0956 04/21/13 0739 04/22/13 0725  NA 141 139 138  K 3.7 4.2 3.6  CL 104 104 102  CO2 26 24 29   GLUCOSE 106* 96 82  BUN 9 9 9   CREATININE 0.75 0.73 0.67  CALCIUM 9.6 9.3 9.4   LFT  Recent Labs  04/20/13 0956  PROT 7.0  ALBUMIN 3.5  AST 22  ALT 23  ALKPHOS 89  BILITOT 0.4   PT/INR  Recent Labs  04/21/13 0739 04/22/13 0725  LABPROT 20.3* 17.2*  INR 1.81* 1.44   Impression:  Pleasant 78 year old lady with occult GI bleeding/iron deficiency anemia of somewhat obscure etiology. Clinically, no obvious bleeding thus far during this admission.  She does continue to have atypical chest pain which precipitated her ED visit 2 days  ago. It is somewhat difficult to characterize. No evidence of acute MI or pulmonary embolus.  Potential GI etiology for chest pain would include pill-induced esophageal injury, esophageal spasm with GERD being less likely a  primary etiology given daily acid suppression therapy.  Occasionally, occult gallbladder disease can present in such an atypical fashion.  Recommendations:  I discussed the possibility of a diagnostic EGD tomorrow with this nice lady. I explained Dr. Karilyn Cota will check in on her tomorrow morning and make further recommendations.  Will hold her Lovenox and make her n.p.o. past midnight tonight.  Followup on pending H. pylori serologies.

## 2013-04-22 NOTE — Progress Notes (Signed)
[facility] PROGRESS NOTE  Yesenia Woods ZOX:[id number] DOB: [birth year] DOA: 04/20/2013 PCP: Katherina Right, MD  Assessment/Plan: 1. Dyspnea.  Likely related to an element of pulmonary edema and volume overload. 2-D echocardiogram was done which showed a normal ejection fraction and no mention of diastolic dysfunction. She was started on IV Lasix yesterday and has had good urine output. Symptomatically, she does feel some improvement in her shortness of breath. Her renal function appears to be tolerating diuresis. We will continue with current treatment. VQ scan was a low probability study for PE. 2. Anemia.  Possibly related to GI losses.  She has heme positive stools.  GI has been consulted, appreciate assistance. Patient was then placed on scheduled for endoscopy in the morning, but Dr. Karilyn Cota will have to see the patient to further evaluate if this is needed..  Hemoglobin has improved with 2 unit prbc.  Will continue to monitor. 3. History of atrial fib.  Coumadin currently on hold.  She is rate controlled. 4. CAD.  Cardiac markers are negative.  Will continue to follow. 5. HTN.  Add hydralazine. 6. Bradycardia.  Patient is asymptomatic.  Review of vitals indicate that she is mostly in 37s. 7. NSVT. Patient has intermittent bursts of NSVT.  Asymptomatic.  Continue BB.  Code Status: DNR Family Communication: discussed with patient Disposition Plan: discharge home once improved   Consultants:  gastroenterology  Procedures:  none  Antibiotics:  none  HPI/Subjective: Feels mild improvement shortness of breath. No signs of bleeding.  Objective: Filed Vitals:   04/21/13 1417 04/21/13 2148 04/22/13 0458 04/22/13 0608  BP: 156/74 159/67  187/101  Pulse: 68 60  66  Temp: 98 F (36.7 C) 98 F (36.7 C)  98.6 F (37 C)  TempSrc: Oral Oral  Oral  Resp: 18 16  18   Height:      Weight:   73.7 kg (162 lb 7.7 oz)   SpO2: 100% 96%  96%    Intake/Output Summary (Last 24  hours) at 04/22/13 1259 Last data filed at 04/22/13 0931  Gross per 24 hour  Intake  486.5 ml  Output   3501 ml  Net -3014.5 ml   Filed Weights   04/20/13 1607 04/21/13 0500 04/22/13 0458  Weight: 73.5 kg (162 lb 0.6 oz) 76.2 kg (167 lb 15.9 oz) 73.7 kg (162 lb 7.7 oz)    Exam:   General:  NAD  Cardiovascular: S1, S2 RRR  Respiratory: diminished breath sounds, crackles at bases  Abdomen: soft, nt, nd, bs+  Musculoskeletal: Trace edema b/l   Data Reviewed: Basic Metabolic Panel:  Recent Labs Lab 04/20/13 0956 04/21/13 0739 04/22/13 0725  NA 141 139 138  K 3.7 4.2 3.6  CL 104 104 102  CO2 26 24 29   GLUCOSE 106* 96 82  BUN 9 9 9   CREATININE 0.75 0.73 0.67  CALCIUM 9.6 9.3 9.4   Liver Function Tests:  Recent Labs Lab 04/20/13 0956  AST 22  ALT 23  ALKPHOS 89  BILITOT 0.4  PROT 7.0  ALBUMIN 3.5   No results found for this basename: LIPASE, AMYLASE,  in the last 168 hours No results found for this basename: AMMONIA,  in the last 168 hours CBC:  Recent Labs Lab 04/20/13 0956 04/21/13 0739 04/22/13 0725  WBC 8.2 9.0 10.2  NEUTROABS 5.6  --   --   HGB 8.6* 10.4* 10.5*  HCT 28.7* 33.4* 33.5*  MCV 63.1* 66.7* 66.7*  PLT 406* 336 369  Cardiac Enzymes:  Recent Labs Lab 04/20/13 1418 04/20/13 2022 04/21/13 0739  TROPONINI <0.30 <0.30 <0.30   BNP (last 3 results)  Recent Labs  04/20/13 0956  PROBNP 674.3*   CBG: No results found for this basename: GLUCAP,  in the last 168 hours  No results found for this or any previous visit (from the past 240 hour(s)).   Studies: Nm Pulmonary Perf And Vent  04/21/2013   *RADIOLOGY REPORT*  Clinical Data: Shortness of breath and chest pain.  Evaluate for pulmonary embolism.  NM PULMONARY VENTILATION AND PERFUSION SCAN  Radiopharmaceutical: CURIE MAA TECHNETIUM TO 72M ALBUMIN AGGREGATED 40 mCi  Comparison: No priors.  Findings: Perfusion imaging demonstrates some subsegmental sized defects in the  left upper lobe.  Perfusion is otherwise normal. Ventilation imaging is essentially nondiagnostic secondary to aggregation of radiotracer in the proximal bronchi which results in extensive artifact.  IMPRESSION: 1.  Despite the limitations of the ventilation portion of the examination, the perfusion portion of the examination is most compatible with a very low probability (0-9% chance) of pulmonary embolism.   Original Report Authenticated By: Trudie Reed, M.D.    Scheduled Meds: . furosemide  40 mg Intravenous BID  . hydrALAZINE  25 mg Oral Q8H  . metoprolol  50 mg Oral BID  . pantoprazole  40 mg Oral Daily  . potassium chloride  40 mEq Oral Daily  . senna  1 tablet Oral BID  . simvastatin  5 mg Oral q1800  . sodium chloride  3 mL Intravenous Q12H  . sodium chloride  3 mL Intravenous Q12H   Continuous Infusions:   Principal Problem:   Dyspnea Active Problems:   Anemia   CAD (coronary artery disease)   HTN (hypertension)   Chest pain   Atrial fibrillation   GERD (gastroesophageal reflux disease)   Hyperlipidemia   Hypoxia    Time spent:    [facility]  [facility] Pager [phone]. If 7PM-7AM, please contact night-coverage at [email or web address], password [email or web address] 04/22/2013, 12:59 PM  LOS: 2 days

## 2013-04-23 ENCOUNTER — Inpatient Hospital Stay (HOSPITAL_COMMUNITY): Payer: Medicare Other

## 2013-04-23 ENCOUNTER — Encounter (HOSPITAL_COMMUNITY): Payer: Self-pay | Admitting: *Deleted

## 2013-04-23 ENCOUNTER — Encounter (HOSPITAL_COMMUNITY)
Admission: EM | Disposition: A | Discharge status: Home Health | Payer: Self-pay | Source: Home / Self Care | Attending: Internal Medicine

## 2013-04-23 DIAGNOSIS — R0609 Other forms of dyspnea: Secondary | ICD-10-CM | POA: Diagnosis not present

## 2013-04-23 DIAGNOSIS — R0989 Other specified symptoms and signs involving the circulatory and respiratory systems: Secondary | ICD-10-CM

## 2013-04-23 DIAGNOSIS — I4891 Unspecified atrial fibrillation: Secondary | ICD-10-CM | POA: Diagnosis not present

## 2013-04-23 DIAGNOSIS — I10 Essential (primary) hypertension: Secondary | ICD-10-CM | POA: Diagnosis not present

## 2013-04-23 DIAGNOSIS — D509 Iron deficiency anemia, unspecified: Secondary | ICD-10-CM

## 2013-04-23 DIAGNOSIS — D649 Anemia, unspecified: Secondary | ICD-10-CM | POA: Diagnosis not present

## 2013-04-23 DIAGNOSIS — D72829 Elevated white blood cell count, unspecified: Secondary | ICD-10-CM | POA: Diagnosis present

## 2013-04-23 DIAGNOSIS — R079 Chest pain, unspecified: Secondary | ICD-10-CM | POA: Diagnosis not present

## 2013-04-23 DIAGNOSIS — K921 Melena: Secondary | ICD-10-CM

## 2013-04-23 HISTORY — PX: ESOPHAGOGASTRODUODENOSCOPY: SHX5428

## 2013-04-23 LAB — CBC
HCT: 35 % — ABNORMAL LOW (ref 36.0–46.0)
MCV: 66.7 fL — ABNORMAL LOW (ref 78.0–100.0)
RBC: 5.25 MIL/uL — ABNORMAL HIGH (ref 3.87–5.11)
RDW: 23.4 % — ABNORMAL HIGH (ref 11.5–15.5)
WBC: 13.1 10*3/uL — ABNORMAL HIGH (ref 4.0–10.5)

## 2013-04-23 LAB — BASIC METABOLIC PANEL
BUN: 7 mg/dL (ref 6–23)
CO2: 29 mEq/L (ref 19–32)
Chloride: 100 mEq/L (ref 96–112)
GFR calc Af Amer: >90 mL/min (ref >90)
Potassium: 3.7 mEq/L (ref 3.5–5.1)

## 2013-04-23 LAB — URINALYSIS, ROUTINE W REFLEX MICROSCOPIC
Bilirubin Urine: NEGATIVE
Glucose, UA: NEGATIVE mg/dL
Nitrite: NEGATIVE
Specific Gravity, Urine: 1.01 (ref 1.005–1.030)
pH: 6 (ref 5.0–8.0)

## 2013-04-23 LAB — URINE MICROSCOPIC-ADD ON

## 2013-04-23 LAB — PROTIME-INR: INR: 1.3 (ref 0.00–1.49)

## 2013-04-23 SURGERY — EGD (ESOPHAGOGASTRODUODENOSCOPY)
Anesthesia: Moderate Sedation

## 2013-04-23 MED ORDER — MEPERIDINE HCL 50 MG/ML IJ SOLN
INTRAMUSCULAR | Status: AC
  Filled 2013-04-23: qty 1

## 2013-04-23 MED ORDER — BUTAMBEN-TETRACAINE-BENZOCAINE 2-2-14 % EX AERO
INHALATION_SPRAY | CUTANEOUS | Status: DC | PRN
  Administered 2013-04-23: 2 via TOPICAL

## 2013-04-23 MED ORDER — WARFARIN - PHARMACIST DOSING INPATIENT: Freq: Every day | Status: DC

## 2013-04-23 MED ORDER — WARFARIN SODIUM 7.5 MG PO TABS: 7.5000 mg | ORAL_TABLET | Freq: Once | ORAL | Status: DC

## 2013-04-23 MED ORDER — MIDAZOLAM HCL 5 MG/5ML IJ SOLN
INTRAMUSCULAR | Status: DC | PRN
  Administered 2013-04-23: 1 mg via INTRAVENOUS
  Administered 2013-04-23: 2 mg via INTRAVENOUS
  Administered 2013-04-23: 1 mg via INTRAVENOUS

## 2013-04-23 MED ORDER — MEPERIDINE HCL 25 MG/ML IJ SOLN
INTRAMUSCULAR | Status: DC | PRN
  Administered 2013-04-23 (×2): 12.5 mg via INTRAVENOUS
  Administered 2013-04-23: 25 mg via INTRAVENOUS

## 2013-04-23 MED ORDER — MIDAZOLAM HCL 5 MG/5ML IJ SOLN
INTRAMUSCULAR | Status: AC
  Filled 2013-04-23: qty 10

## 2013-04-23 MED ORDER — SODIUM CHLORIDE 0.9 % IV SOLN
INTRAVENOUS | Status: DC
  Administered 2013-04-23: 09:00:00 via INTRAVENOUS

## 2013-04-23 MED ORDER — STERILE WATER FOR IRRIGATION IR SOLN
Status: DC | PRN
  Administered 2013-04-23: 12:00:00

## 2013-04-23 NOTE — Progress Notes (Signed)
[facility] PROGRESS NOTE  Yesenia Woods ZOX:[id number] DOB: [birth year] DOA: 04/20/2013 PCP: Katherina Right, MD  Assessment/Plan: 1. Dyspnea. Likely related to an element of pulmonary edema and volume overload. 2-D echocardiogram was done which showed a normal ejection fraction and no mention of diastolic dysfunction. She was started on IV Lasix 04/21/13 and has had good urine output. Volume -4.2L wt down to 73.5kg from 76.2kg.  Symptomatically, she continues to feel some improvement in her shortness of breath. Her renal function continues to be tolerating diuresis. We will continue with current treatment. VQ scan was a low probability study for PE. 2. Anemia. Possibly related to GI losses. She has heme positive stools. GI has been consulted, appreciate assistance. EGD today per Dr. Karilyn Cota. s/p 2 unit prbc. Will continue to monitor. 3. History of atrial fib. She is rate controlled. She is cleared to restart Coumadin per GI. 4. CAD. Cardiac markers are negative. Will continue to follow. 5. HTN. Controlled.continue BB, lasix and  hydralazine. 6. Bradycardia. Patient is asymptomatic. Review of vitals indicate that she is mostly in 24s. 7. NSVT. Patient has intermittent bursts of NSVT. Asymptomatic. Continue BB. 8. Leukocytosis: max temp 99.8, hemodynamically stable. Will check urine and provide IS. Monitor.    Code Status: DNR Family Communication:  Disposition Plan: home when ready likely tomorrow   Consultants:  GI  Procedures:  EGD 04/23/13  Antibiotics:    HPI/Subjective: Awake no complaints NAD  Objective: Filed Vitals:   04/22/13 1405 04/22/13 2123 04/22/13 2350 04/23/13 0525  BP: 135/52 144/88  131/73  Pulse: 98 71  80  Temp: 98.1 F (36.7 C) 99 F (37.2 C)  99.8 F (37.7 C)  TempSrc: Oral Oral  Oral  Resp: 20 20  20   Height:      Weight:    73.5 kg (162 lb 0.6 oz)  SpO2: 96% 92% 97% 97%    Intake/Output Summary (Last 24 hours) at 04/23/13 1013 Last data  filed at 04/23/13 0933  Gross per 24 hour  Intake    880 ml  Output   2050 ml  Net  -1170 ml   Filed Weights   04/21/13 0500 04/22/13 0458 04/23/13 0525  Weight: 76.2 kg (167 lb 15.9 oz) 73.7 kg (162 lb 7.7 oz) 73.5 kg (162 lb 0.6 oz)    Exam:   General:  Well nourished NAD  Cardiovascular: RRR No MGR Trace LE edema  Respiratory: mild increased work of breathing with repositioning in bed. BS with fine crackles in right base.   Abdomen: obese soft +BS non-tender to palpation  Musculoskeletal: no clubbing no cyanosis   Data Reviewed: Basic Metabolic Panel:  Recent Labs Lab 04/20/13 0956 04/21/13 0739 04/22/13 0725 04/23/13 0540  NA 141 139 138 139  K 3.7 4.2 3.6 3.7  CL 104 104 102 100  CO2 26 24 29 29   GLUCOSE 106* 96 82 98  BUN 9 9 9 7   CREATININE 0.75 0.73 0.67 0.68  CALCIUM 9.6 9.3 9.4 9.6   Liver Function Tests:  Recent Labs Lab 04/20/13 0956  AST 22  ALT 23  ALKPHOS 89  BILITOT 0.4  PROT 7.0  ALBUMIN 3.5   No results found for this basename: LIPASE, AMYLASE,  in the last 168 hours No results found for this basename: AMMONIA,  in the last 168 hours CBC:  Recent Labs Lab 04/20/13 0956 04/21/13 0739 04/22/13 0725 04/23/13 0540  WBC 8.2 9.0 10.2 13.1*  NEUTROABS 5.6  --   --   --  HGB 8.6* 10.4* 10.5* 11.1*  HCT 28.7* 33.4* 33.5* 35.0*  MCV 63.1* 66.7* 66.7* 66.7*  PLT 406* 336 369 445*   Cardiac Enzymes:  Recent Labs Lab 04/20/13 1418 04/20/13 2022 04/21/13 0739  TROPONINI <0.30 <0.30 <0.30   BNP (last 3 results)  Recent Labs  04/20/13 0956  PROBNP 674.3*   CBG: No results found for this basename: GLUCAP,  in the last 168 hours  No results found for this or any previous visit (from the past 240 hour(s)).   Studies: Nm Pulmonary Perf And Vent  04/21/2013   *RADIOLOGY REPORT*  Clinical Data: Shortness of breath and chest pain.  Evaluate for pulmonary embolism.  NM PULMONARY VENTILATION AND PERFUSION SCAN   Radiopharmaceutical: CURIE MAA TECHNETIUM TO 53M ALBUMIN AGGREGATED 40 mCi  Comparison: No priors.  Findings: Perfusion imaging demonstrates some subsegmental sized defects in the left upper lobe.  Perfusion is otherwise normal. Ventilation imaging is essentially nondiagnostic secondary to aggregation of radiotracer in the proximal bronchi which results in extensive artifact.  IMPRESSION: 1.  Despite the limitations of the ventilation portion of the examination, the perfusion portion of the examination is most compatible with a very low probability (0-9% chance) of pulmonary embolism.   Original Report Authenticated By: Trudie Reed, M.D.    Scheduled Meds: . furosemide  40 mg Intravenous BID  . hydrALAZINE  25 mg Oral Q8H  . metoprolol  50 mg Oral BID  . pantoprazole  40 mg Oral Daily  . potassium chloride  40 mEq Oral Daily  . senna  1 tablet Oral BID  . simvastatin  5 mg Oral q1800  . sodium chloride  3 mL Intravenous Q12H  . sodium chloride  3 mL Intravenous Q12H   Continuous Infusions: . sodium chloride 20 mL/hr at 04/23/13 4098    Principal Problem:   Dyspnea Active Problems:   Anemia   CAD (coronary artery disease)   HTN (hypertension)   Chest pain   Atrial fibrillation   GERD (gastroesophageal reflux disease)   Hyperlipidemia   Hypoxia    Time spent: 35 minutes    [facility] M  [facility] Pager [phone]. If 7PM-7AM, please contact night-coverage at [email or web address], password [facility] 04/23/2013, 10:13 AM  LOS: 3 days   Attending note:  Patient seen and examined.  Above note reviewed.  Patient continues to complain of some shortness of breath, although she does say this has improved. She is making good urine with Lasix. We will repeat chest x-ray today to check progression of volume overload.  She has undergone endoscopy today which did not show any evidence of active bleeding. She has been cleared to start Coumadin by GI.  We will remove oxygen from  the patient ambulated her. We will get physical therapy involved. Probable discharge in the next 24-48 hours   Braidon Chermak

## 2013-04-23 NOTE — Care Management Note (Unsigned)
    Page 1 of 1   04/23/2013     3:13:03 PM   CARE MANAGEMENT NOTE 04/23/2013  Patient:  Yesenia Woods,Yesenia Woods   Account Number:  [id number]  Date Initiated:  04/23/2013  Documentation initiated by:  Rosemary Holms  Subjective/Objective Assessment:   pt admitted from home where she lives alone. Per pt, very independent but is very weak now. If HH needed, would like to use [facility] from [place]     Action/Plan:   Anticipated DC Date:  04/24/2013   Anticipated DC Plan:  HOME W HOME HEALTH SERVICES      DC Planning Services  CM consult      Choice offered to / List presented to:          [facility] arranged  HH-1 RN  HH-10 DISEASE MANAGEMENT      HH agency  [facility]   Status of service:  In process, will continue to follow Medicare Important Message given?   (If response is "NO", the following Medicare IM given date fields will be blank) Date Medicare IM given:   Date Additional Medicare IM given:    Discharge Disposition:    Per UR Regulation:    If discussed at Long Length of Stay Meetings, dates discussed:    Comments:  04/23/13 Rosemary Holms RN BSN CM

## 2013-04-23 NOTE — Progress Notes (Signed)
Pt refuses morning meds. She is NPO for possible morning procedure, EGD. States she does not like to take medicine on an empty stomach. BP 137/73 this am.

## 2013-04-23 NOTE — Clinical Social Work Note (Signed)
CSW received consult for assistance with advance directives. Pt reports she has completed advance directives at home, but did not bring them with her here. Her main concern while hospitalized is that she be a DNR. CSW confirmed this on epic. She will plan to bring a copy of her advance directives for her chart at some point in the future. No other SW needs reported. CSW signing off but can be reconsulted if needed.  Derenda Fennel, [place] [phone]

## 2013-04-23 NOTE — Op Note (Signed)
EGD PROCEDURE REPORT  PATIENT:  Makenah Karas Kosh  MR#:  [id number] Birthdate:  October 31, 1935, 78 y.o., female Endoscopist:  Dr. Malissa Hippo, MD Procedure Date: 04/23/2013  Procedure:   EGD  Indications:  [name] who was admitted 3 days ago and chest pain and anemia. Her chest pain has resolved. Her stool was heme positive. She has received 2 units of PRBCs and her hemoglobin is up to 11.1 g from 8.6 on admission. She has history of GI bleed and iron deficiency anemia and most recently evaluated in September 2012. 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 She is undergoing diagnostic EGD looking for source of GI blood loss and chest pain.            Informed Consent:  The risks, benefits, alternatives & imponderables which include, but are not limited to, bleeding, infection, perforation, drug reaction and potential missed lesion have been reviewed.  The potential for biopsy, lesion removal, esophageal dilation, etc. have also been discussed.  Questions have been answered.  All parties agreeable.  Please see history & physical in medical record for more information.  Medications:  Demerol 50 mg IV Versed 4 mg IV Cetacaine spray topically for oropharyngeal anesthesia  Description of procedure:  The endoscope was introduced through the mouth and advanced to the second portion of the duodenum without difficulty or limitations. The mucosal surfaces were surveyed very carefully during advancement of the scope and upon withdrawal.  Findings:  Esophagus:  Mucosa of the esophagus was normal. GE junction was unremarkable. GEJ:  39 cm Stomach:  Stomach was empty and distended very well with insufflation. Folds in the proximal stomach were normal. Examination of mucosa and body, antrum, pyloric channel, angularis, fundus and cardia was normal. Duodenum:  Normal bulbar and post bulbar mucosa.  Therapeutic/Diagnostic Maneuvers Performed:   None  Complications:  None  Impression: Normal esophagogastroduodenoscopy. No abnormality noted to account for patient's chest pain or GI bleed.   Recommendations:  Advance diet. Can resume warfarin. H&H in a.m. If patient has overt GI bleed would consider bleeding scan. H&H will need to be followed every 8-12 weeks.  REHMAN,NAJEEB U  04/23/2013  12:11 PM  CC: Dr. Katherina Right, MD & Dr. Bonnetta Barry ref. provider found

## 2013-04-23 NOTE — Progress Notes (Signed)
Patient feels better this morning. She denies chest pain or shortness of breath. She also denies history of renal or rectal bleeding but she had noted gradual weakness prior to this admission. H&H this morning is 11.1 and 35.0. INR is 1.59. Will proceed with esophagogastroduodenoscopy this afternoon looking for pill esophagitis which might explain her chest symptoms and source of GI bleed which is suspected to be chronic or intermittent.

## 2013-04-23 NOTE — Progress Notes (Signed)
ANTICOAGULATION CONSULT NOTE - Initial Consult  Pharmacy Consult for Warfarin Indication: atrial fibrillation    Patient Measurements: Height: 5' (152.4 cm) Weight: 162 lb 0.6 oz (73.5 kg) IBW/kg (Calculated) : 45.5  Vital Signs: Temp: 98.7 F (37.1 C) (06/09 1349) Temp src: Oral (06/09 1349) BP: 165/92 mmHg (06/09 1349) Pulse Rate: 79 (06/09 1349)  Labs:  Recent Labs  04/20/13 2022  04/21/13 0739 04/22/13 0725 04/23/13 0540  HGB  --   < > 10.4* 10.5* 11.1*  HCT  --   --  33.4* 33.5* 35.0*  PLT  --   --  336 369 445*  LABPROT  --   --  20.3* 17.2* 15.9*  INR  --   --  1.81* 1.44 1.30  CREATININE  --   --  0.73 0.67 0.68  TROPONINI <0.30  --  <0.30  --   --   < > = values in this interval not displayed.  Estimated Creatinine Clearance: 53.6 ml/min (by C-G formula based on Cr of 0.68).   Medical History: Past Medical History  Diagnosis Date  . Myocardial infarct   . Cancer     breast  . Hypertension   . Shortness of breath   . Anemia   . Headache(784.0)   . Arthritis     Medications:  Scheduled:  . furosemide  40 mg Intravenous BID  . hydrALAZINE  25 mg Oral Q8H  . metoprolol  50 mg Oral BID  . pantoprazole  40 mg Oral Daily  . potassium chloride  40 mEq Oral Daily  . senna  1 tablet Oral BID  . simvastatin  5 mg Oral q1800  . sodium chloride  3 mL Intravenous Q12H  . warfarin  7.5 mg Oral Once  . [START ON 04/24/2013] Warfarin - Pharmacist Dosing Inpatient   Does not apply q1800    Assessment: Coumadin held due to possible GI bleed. After EGD, Coumadin to be resumed Patient on 7.5 mg on Monday and Friday, 4 mg other days PTA INR presently 1.30 due to Coumadin being held  Goal of Therapy:  INR 2-3 Monitor platelets by anticoagulation protocol: Yes   Plan:  Coumadin 7.5 mg po tonight, resumed home dose INR/PT daily Labs per protocol  Josephine Igo 04/23/2013,6:21 PM

## 2013-04-23 NOTE — Progress Notes (Signed)
Utilization Review Complete  

## 2013-04-24 ENCOUNTER — Encounter (HOSPITAL_COMMUNITY): Payer: Self-pay | Admitting: Internal Medicine

## 2013-04-24 DIAGNOSIS — R0609 Other forms of dyspnea: Secondary | ICD-10-CM | POA: Diagnosis not present

## 2013-04-24 DIAGNOSIS — R079 Chest pain, unspecified: Secondary | ICD-10-CM | POA: Diagnosis not present

## 2013-04-24 DIAGNOSIS — I4891 Unspecified atrial fibrillation: Secondary | ICD-10-CM | POA: Diagnosis not present

## 2013-04-24 DIAGNOSIS — D649 Anemia, unspecified: Secondary | ICD-10-CM | POA: Diagnosis not present

## 2013-04-24 DIAGNOSIS — I10 Essential (primary) hypertension: Secondary | ICD-10-CM | POA: Diagnosis not present

## 2013-04-24 DIAGNOSIS — D509 Iron deficiency anemia, unspecified: Secondary | ICD-10-CM | POA: Diagnosis not present

## 2013-04-24 DIAGNOSIS — K921 Melena: Secondary | ICD-10-CM | POA: Diagnosis not present

## 2013-04-24 DIAGNOSIS — K922 Gastrointestinal hemorrhage, unspecified: Secondary | ICD-10-CM

## 2013-04-24 LAB — BASIC METABOLIC PANEL
CO2: 26 mEq/L (ref 19–32)
Calcium: 9.8 mg/dL (ref 8.4–10.5)
Chloride: 97 mEq/L (ref 96–112)
Glucose, Bld: 85 mg/dL (ref 70–99)
Potassium: 3.7 mEq/L (ref 3.5–5.1)
Sodium: 135 mEq/L (ref 135–145)

## 2013-04-24 LAB — PROTIME-INR
INR: 1.18 (ref 0.00–1.49)
Prothrombin Time: 14.8 seconds (ref 11.6–15.2)

## 2013-04-24 LAB — CBC
Hemoglobin: 11.6 g/dL — ABNORMAL LOW (ref 12.0–15.0)
MCH: 21.1 pg — ABNORMAL LOW (ref 26.0–34.0)
RBC: 5.51 MIL/uL — ABNORMAL HIGH (ref 3.87–5.11)
WBC: 12.3 10*3/uL — ABNORMAL HIGH (ref 4.0–10.5)

## 2013-04-24 MED ORDER — WARFARIN SODIUM 7.5 MG PO TABS: 7.5000 mg | ORAL_TABLET | Freq: Once | ORAL | Status: DC

## 2013-04-24 MED ORDER — POTASSIUM CHLORIDE CRYS ER 20 MEQ PO TBCR: 40.0000 meq | EXTENDED_RELEASE_TABLET | Freq: Every day | ORAL | Status: DC

## 2013-04-24 MED ORDER — HYDRALAZINE HCL 25 MG PO TABS: 25.0000 mg | ORAL_TABLET | Freq: Three times a day (TID) | ORAL | Status: DC

## 2013-04-24 MED ORDER — FUROSEMIDE 40 MG PO TABS: 40.0000 mg | ORAL_TABLET | Freq: Every day | ORAL | Status: DC

## 2013-04-24 NOTE — Progress Notes (Signed)
Subjective; Patient denies chest or abdominal pain. She also denies melena or rectal bleeding. Her appetite is fair. She continues to complain of shortness of breath. She says she's had shortness of breath for past few years. She believes nasal O2 helps. Objective; BP 120/78  Pulse 73  Temp(Src) 99.8 F (37.7 C) (Oral)  Resp 18  Ht 5' (1.524 m)  Wt 162 lb 12.8 oz (73.846 kg)  BMI 31.79 kg/m2  SpO2 96% Patient is alert and appears to be in no distress. Cardiac exam with irregular rhythm normal S1 and S2. Auscultation of lungs revealed vesicular breath sounds bilaterally. No rales or rhonchi noted. Abdomen is full but soft and nontender without organomegaly or masses. Lab data; WBC 12.3 H&H is 11.6 and 36.7 Platelet count not determined because of clumping but colon appears to be increased. Electrolytes within normal limits. Glucose 85, BUN 11 and creatinine 0.81 INR 1.18 Assessment; #1. Anemia secondary to chronic or intermittent GI bleed. No lesion found on EGD performed yesterday. Suspect bleed secondary to small bowel AVMs. H&H is coming up. #2. Dyspnea. Acute on chronic possibly secondary to CHF and COPD. Recommendations; Will continue to monitor H&H. If evidence of a foot bleed will consider further evaluation.

## 2013-04-24 NOTE — Discharge Summary (Signed)
Physician Discharge Summary  Yesenia Woods KGM:[id number] DOB: 1935/01/11 DOA: 04/20/2013  PCP: Katherina Right, MD  Admit date: 04/20/2013 Discharge date: 04/24/2013  Time spent: 40 minutes  Recommendations for Outpatient Follow-up:  1. Follow up with PCP in 2 weeks. Recommend CBC to track Hg.   Discharge Diagnoses:  Principal Problem:   Dyspnea Active Problems:   Anemia   CAD (coronary artery disease)   HTN (hypertension)   Chest pain   Atrial fibrillation   GERD (gastroesophageal reflux disease)   Hyperlipidemia   Hypoxia   Leukocytosis, unspecified Pulmonary Edema Acute on chronic diastolic CHF  Discharge Condition: stable  Diet recommendation: low sodium  Filed Weights   04/22/13 0458 04/23/13 0525 04/24/13 0257  Weight: 73.7 kg (162 lb 7.7 oz) 73.5 kg (162 lb 0.6 oz) 73.846 kg (162 lb 12.8 oz)    History of present illness:  Yesenia Woods is a 78 y.o. female with past medical history including hypertension, CAD, A. fib GERD, GI bleed, anemia, hyperlipidemia who presented to the emergency department on 04/20/18 with the chief complaint of chest pain. Information obtained from the patient. She stated that she had intermittent chest pain and worsening shortness of breath over the previous week. She reported the pain  located central chest and feels like a very heavy ball is sitting on her chest. She denied any radiation of this pain. She reported it reminded her of heartburn so she "took a pill" without relief. She denied nausea vomiting diaphoresis during this time. She did endorse some palpation. She also reported worsening shortness of breath. She denied orthopnea or lower extremity edema. She denied cough fever chills anorexia. She denied abdominal pain nausea vomiting diarrhea. She denied taking any NSAID. She denied melena. She denied dysuria hematuria. In the emergency room she was found to have a hemoglobin of 8.6, a chest x-ray with cardiomegaly and pulmonary vascular  congestion a proBNP of 674, a d-dimer of 1.57. Patient stated she had an appointment with her doctor next week and she was trying to wait until then but this shortness of breath worsened .   Hospital Course:  1. Dyspnea. Likely related to an element of pulmonary edema and volume overload in setting of anemia. 2-D echocardiogram was done which showed a normal ejection fraction. She was started on IV Lasix 04/21/13 and has had good urine output. Volume -4.9L at discharge. At discharge  wt down to 73.8kg from 76.2kg on admission. Symptomatically, she improved slowly in her shortness of breath. Her renal function tolerated diuresis. On admission d-dimer elevated and some concern for PE. VQ scan was a low probability study for PE. At discharge pt ambulated in hall on room air without hypoxia. Sats remained 93-94%. 2. Acute blood loss Anemia. She had heme positive stools. Seen by GI and EGD done 04/23/13 that was normal.  GI suspected chronic/intermittent bleed secondary to small bowel AVM's.recommended close OP monitoring of Hg. Pt received 2 unit prbc and at discharge Hg 11.6. Recommend PCP follow up 1-2 weeks 3. History of atrial fib. Coumadin held initially. Resumed 04/23/13.  She remained controlled. HH RN will draw INR on 04/27/13 to send to Dr. Earna Coder  4. CAD. Cardiac markers are negative.chest pain resolved. No events on tele. Likely secondary to pulmonary edema. 5. HTN. Controlled.continue BB, lasix and hydralazine at discharge. 6. Bradycardia. On admission HR range 48-52.  Patient was asymptomatic. BB held initially then resumed 04/21/13. At discharge HR range 75-82.    7. NSVT. Patient had  intermittent bursts of NSVT. Asymptomatic. Continue BB. 8. Leukocytosis: likely reactive. Chest xray with chronic changes without edema or infiltrate. Urine unremarkable. H.pylori antibody 4.16      Procedures:  EGD 04/23/13  Consultations:  GI  Discharge Exam: Filed Vitals:   04/23/13 1259 04/23/13 1349  04/24/13 0257 04/24/13 0533  BP: 112/68 165/92  120/78  Pulse: 80 79  73  Temp:  98.7 F (37.1 C)  99.8 F (37.7 C)  TempSrc:  Oral  Oral  Resp: 16 16  18   Height:      Weight:   73.846 kg (162 lb 12.8 oz)   SpO2: 98% 92%  96%    General: well nourished ambulating in hall Cardiovascular: RRR No MGR trace LE edema Respiratory: normal effort BS clear bilaterally no wheeze Abdomen: soft +BS non-tender to palpation  Discharge Instructions     Medication List    TAKE these medications       aspirin 81 MG tablet  Take 81 mg by mouth daily.     furosemide 40 MG tablet  Commonly known as:  LASIX  Take 1 tablet (40 mg total) by mouth daily.     hydrALAZINE 25 MG tablet  Commonly known as:  APRESOLINE  Take 1 tablet (25 mg total) by mouth every 8 (eight) hours.     metoprolol 50 MG tablet  Commonly known as:  LOPRESSOR  Take 50 mg by mouth 2 (two) times daily.     omeprazole 20 MG capsule  Commonly known as:  PRILOSEC  Take 20 mg by mouth daily.     potassium chloride SA 20 MEQ tablet  Commonly known as:  K-DUR,KLOR-CON  Take 2 tablets (40 mEq total) by mouth daily.     pravastatin 40 MG tablet  Commonly known as:  PRAVACHOL  Take 80 mg by mouth at bedtime.     warfarin 4 MG tablet  Commonly known as:  COUMADIN  Take 4-7.5 mg by mouth daily. 7.5 mg on Monday and Friday. 4 mg Tues, Wed, Thurs, Sat, and Sun       No Known Allergies     Follow-up Information   Follow up with Katherina Right, MD. Schedule an appointment as soon as possible for a visit in 3 weeks. (will need cbc to track Hg )    Contact information:   [address] [place] [address] [phone]        The results of significant diagnostics from this hospitalization (including imaging, microbiology, ancillary and laboratory) are listed below for reference.    Significant Diagnostic Studies: Dg Chest 2 View  04/23/2013   *RADIOLOGY REPORT*  Clinical Data: Shortness of breath, chest  pain and anemia.  CHEST - 2 VIEW  Comparison: 04/20/2013  Findings: Stable chronic changes related to the interstitial prominence and pulmonary vascular prominence without overt edema. No focal consolidation is identified.  There is no evidence of pleural fluid.  Stable cardiomegaly status post prior CABG.  The bony thorax is unremarkable.  IMPRESSION: Stable chronic changes in the lungs.  No overt pulmonary edema or infiltrate.   Original Report Authenticated By: Irish Lack, M.D.   Nm Pulmonary Perf And Vent  04/21/2013   *RADIOLOGY REPORT*  Clinical Data: Shortness of breath and chest pain.  Evaluate for pulmonary embolism.  NM PULMONARY VENTILATION AND PERFUSION SCAN  Radiopharmaceutical: CURIE MAA TECHNETIUM TO 71M ALBUMIN AGGREGATED 40 mCi  Comparison: No priors.  Findings: Perfusion imaging demonstrates some subsegmental sized defects in  the left upper lobe.  Perfusion is otherwise normal. Ventilation imaging is essentially nondiagnostic secondary to aggregation of radiotracer in the proximal bronchi which results in extensive artifact.  IMPRESSION: 1.  Despite the limitations of the ventilation portion of the examination, the perfusion portion of the examination is most compatible with a very low probability (0-9% chance) of pulmonary embolism.   Original Report Authenticated By: Trudie Reed, M.D.   Dg Chest Portable 1 View  04/20/2013   *RADIOLOGY REPORT*  Clinical Data: Chest pain and shortness of breath.  PORTABLE CHEST - 1 VIEW  Comparison: PA and lateral chest 07/21/2011.  Findings: The patient is status post median sternotomy.  There is cardiomegaly and vascular congestion.  No consolidative process, pneumothorax or effusion.  IMPRESSION: Cardiomegaly and pulmonary vascular congestion.   Original Report Authenticated By: Holley Dexter, M.D.    Microbiology: No results found for this or any previous visit (from the past 240 hour(s)).   Labs: Basic Metabolic Panel:  Recent  Labs Lab 04/20/13 0956 04/21/13 0739 04/22/13 0725 04/23/13 0540 04/24/13 0557  NA 141 139 138 139 135  K 3.7 4.2 3.6 3.7 3.7  CL 104 104 102 100 97  CO2 26 24 29 29 26   GLUCOSE 106* 96 82 98 85  BUN 9 9 9 7 11   CREATININE 0.75 0.73 0.67 0.68 0.81  CALCIUM 9.6 9.3 9.4 9.6 9.8   Liver Function Tests:  Recent Labs Lab 04/20/13 0956  AST 22  ALT 23  ALKPHOS 89  BILITOT 0.4  PROT 7.0  ALBUMIN 3.5   No results found for this basename: LIPASE, AMYLASE,  in the last 168 hours No results found for this basename: AMMONIA,  in the last 168 hours CBC:  Recent Labs Lab 04/20/13 0956 04/21/13 0739 04/22/13 0725 04/23/13 0540 04/24/13 0557  WBC 8.2 9.0 10.2 13.1* 12.3*  NEUTROABS 5.6  --   --   --   --   HGB 8.6* 10.4* 10.5* 11.1* 11.6*  HCT 28.7* 33.4* 33.5* 35.0* 36.7  MCV 63.1* 66.7* 66.7* 66.7* 66.6*  PLT 406* 336 369 445* PLATELET CLUMPS NOTED ON SMEAR, COUNT APPEARS INCREASED   Cardiac Enzymes:  Recent Labs Lab 04/20/13 1418 04/20/13 2022 04/21/13 0739  TROPONINI <0.30 <0.30 <0.30   BNP: BNP (last 3 results)  Recent Labs  04/20/13 0956  PROBNP 674.3*   CBG: No results found for this basename: GLUCAP,  in the last 168 hours     Signed:  Gwenyth Bender  [facility] 04/24/2013, 12:35 PM  Attending note:  Patient seen and examined.  Above note reviewed and agree.   Respiratory status has improved with diuresis. She is breathing comfortably on room air now. She will be discharged on lasix. Anemia is stable, she does not have evidence of ongoing bleeding.  She has been started back on coumadin for atrial fibrillation.  She is felt back to baseline and ready for discharge home.  Mamye Bolds

## 2013-04-24 NOTE — Progress Notes (Signed)
Pt. D/c instructions reviewed with pt. Prescriptions reviewed. IV removed, pt assisted with dressing, followup appts. Reviewed, along with instructions from MD.

## 2013-04-24 NOTE — Progress Notes (Signed)
ANTICOAGULATION CONSULT NOTE  Pharmacy Consult for Warfarin Indication: atrial fibrillation    Patient Measurements: Height: 5' (152.4 cm) Weight: 162 lb 12.8 oz (73.846 kg) IBW/kg (Calculated) : 45.5  Vital Signs: Temp: 99.8 F (37.7 C) (06/10 0533) Temp src: Oral (06/10 0533) BP: 120/78 mmHg (06/10 0533) Pulse Rate: 73 (06/10 0533)  Labs:  Recent Labs  04/22/13 0725 04/23/13 0540 04/24/13 0557  HGB 10.5* 11.1* 11.6*  HCT 33.5* 35.0* 36.7  PLT 369 445* PLATELET CLUMPS NOTED ON SMEAR, COUNT APPEARS INCREASED  LABPROT 17.2* 15.9* 14.8  INR 1.44 1.30 1.18  CREATININE 0.67 0.68 0.81    Estimated Creatinine Clearance: 53 ml/min (by C-G formula based on Cr of 0.81).   Medical History: Past Medical History  Diagnosis Date  . Myocardial infarct   . Cancer     breast  . Hypertension   . Shortness of breath   . Anemia   . Headache(784.0)   . Arthritis     Medications:  Scheduled:  . furosemide  40 mg Intravenous BID  . hydrALAZINE  25 mg Oral Q8H  . metoprolol  50 mg Oral BID  . pantoprazole  40 mg Oral Daily  . potassium chloride  40 mEq Oral Daily  . senna  1 tablet Oral BID  . simvastatin  5 mg Oral q1800  . sodium chloride  3 mL Intravenous Q12H  . warfarin  7.5 mg Oral Once  . Warfarin - Pharmacist Dosing Inpatient   Does not apply q1800    Assessment: 78 yo F on chronic warfarin for Afib.  Home dose is 4mg  daily except 7.5 mg on Mon/Friday.  INR was therapeutic on admission but has been held due to possible GI bleed. She is now s/p EGD and cleared by GI to resume warfarin.  INR is at baseline after being held.  No bleeding noted.   Goal of Therapy:  INR 2-3   Plan:  Coumadin 7.5 mg po x1 dose today INR/PT daily  Yesenia Woods, [facility] 04/24/2013,8:48 AM

## 2013-04-24 NOTE — Progress Notes (Signed)
Pt. At rest on RA saturation 93-95% HR in 80s. Ambulating approximately 100 feet on RA saturation 92-93%.

## 2013-04-25 DIAGNOSIS — I509 Heart failure, unspecified: Secondary | ICD-10-CM | POA: Diagnosis not present

## 2013-04-25 DIAGNOSIS — I4891 Unspecified atrial fibrillation: Secondary | ICD-10-CM | POA: Diagnosis not present

## 2013-04-25 DIAGNOSIS — Z7901 Long term (current) use of anticoagulants: Secondary | ICD-10-CM | POA: Diagnosis not present

## 2013-04-27 DIAGNOSIS — I509 Heart failure, unspecified: Secondary | ICD-10-CM | POA: Diagnosis not present

## 2013-04-27 DIAGNOSIS — Z7901 Long term (current) use of anticoagulants: Secondary | ICD-10-CM | POA: Diagnosis not present

## 2013-04-27 DIAGNOSIS — I4891 Unspecified atrial fibrillation: Secondary | ICD-10-CM | POA: Diagnosis not present

## 2013-05-03 DIAGNOSIS — I119 Hypertensive heart disease without heart failure: Secondary | ICD-10-CM | POA: Diagnosis not present

## 2013-05-03 DIAGNOSIS — I2510 Atherosclerotic heart disease of native coronary artery without angina pectoris: Secondary | ICD-10-CM | POA: Diagnosis not present

## 2013-05-03 DIAGNOSIS — I4891 Unspecified atrial fibrillation: Secondary | ICD-10-CM | POA: Diagnosis not present

## 2013-05-03 DIAGNOSIS — E785 Hyperlipidemia, unspecified: Secondary | ICD-10-CM | POA: Diagnosis not present

## 2013-05-07 DIAGNOSIS — E039 Hypothyroidism, unspecified: Secondary | ICD-10-CM | POA: Diagnosis not present

## 2013-05-07 DIAGNOSIS — N289 Disorder of kidney and ureter, unspecified: Secondary | ICD-10-CM | POA: Diagnosis not present

## 2013-05-07 DIAGNOSIS — E785 Hyperlipidemia, unspecified: Secondary | ICD-10-CM | POA: Diagnosis not present

## 2013-05-07 DIAGNOSIS — I2510 Atherosclerotic heart disease of native coronary artery without angina pectoris: Secondary | ICD-10-CM | POA: Diagnosis not present

## 2013-05-07 DIAGNOSIS — I4891 Unspecified atrial fibrillation: Secondary | ICD-10-CM | POA: Diagnosis not present

## 2013-05-07 DIAGNOSIS — E782 Mixed hyperlipidemia: Secondary | ICD-10-CM | POA: Diagnosis not present

## 2013-05-07 DIAGNOSIS — R0602 Shortness of breath: Secondary | ICD-10-CM | POA: Diagnosis not present

## 2013-05-07 DIAGNOSIS — I119 Hypertensive heart disease without heart failure: Secondary | ICD-10-CM | POA: Diagnosis not present

## 2013-05-09 DIAGNOSIS — I4891 Unspecified atrial fibrillation: Secondary | ICD-10-CM | POA: Diagnosis not present

## 2013-05-16 DIAGNOSIS — R002 Palpitations: Secondary | ICD-10-CM | POA: Diagnosis not present

## 2013-05-16 DIAGNOSIS — I4949 Other premature depolarization: Secondary | ICD-10-CM | POA: Diagnosis not present

## 2013-05-21 DIAGNOSIS — I2510 Atherosclerotic heart disease of native coronary artery without angina pectoris: Secondary | ICD-10-CM | POA: Diagnosis not present

## 2013-05-21 DIAGNOSIS — R072 Precordial pain: Secondary | ICD-10-CM | POA: Diagnosis not present

## 2013-05-21 DIAGNOSIS — I4891 Unspecified atrial fibrillation: Secondary | ICD-10-CM | POA: Diagnosis not present

## 2013-05-22 DIAGNOSIS — D649 Anemia, unspecified: Secondary | ICD-10-CM | POA: Diagnosis not present

## 2013-05-22 DIAGNOSIS — I10 Essential (primary) hypertension: Secondary | ICD-10-CM | POA: Diagnosis not present

## 2013-05-22 DIAGNOSIS — E785 Hyperlipidemia, unspecified: Secondary | ICD-10-CM | POA: Diagnosis not present

## 2013-05-30 DIAGNOSIS — I4891 Unspecified atrial fibrillation: Secondary | ICD-10-CM | POA: Diagnosis not present

## 2013-05-30 DIAGNOSIS — I2510 Atherosclerotic heart disease of native coronary artery without angina pectoris: Secondary | ICD-10-CM | POA: Diagnosis not present

## 2013-05-30 DIAGNOSIS — R072 Precordial pain: Secondary | ICD-10-CM | POA: Diagnosis not present

## 2013-06-28 DIAGNOSIS — I4891 Unspecified atrial fibrillation: Secondary | ICD-10-CM | POA: Diagnosis not present

## 2013-06-28 DIAGNOSIS — I059 Rheumatic mitral valve disease, unspecified: Secondary | ICD-10-CM | POA: Diagnosis not present

## 2013-06-28 DIAGNOSIS — I2510 Atherosclerotic heart disease of native coronary artery without angina pectoris: Secondary | ICD-10-CM | POA: Diagnosis not present

## 2013-06-29 DIAGNOSIS — R0602 Shortness of breath: Secondary | ICD-10-CM | POA: Diagnosis not present

## 2013-07-02 DIAGNOSIS — I2510 Atherosclerotic heart disease of native coronary artery without angina pectoris: Secondary | ICD-10-CM | POA: Diagnosis not present

## 2013-07-04 DIAGNOSIS — I119 Hypertensive heart disease without heart failure: Secondary | ICD-10-CM | POA: Diagnosis not present

## 2013-07-04 DIAGNOSIS — I4891 Unspecified atrial fibrillation: Secondary | ICD-10-CM | POA: Diagnosis not present

## 2013-07-04 DIAGNOSIS — I2510 Atherosclerotic heart disease of native coronary artery without angina pectoris: Secondary | ICD-10-CM | POA: Diagnosis not present

## 2013-07-04 DIAGNOSIS — I059 Rheumatic mitral valve disease, unspecified: Secondary | ICD-10-CM | POA: Diagnosis not present

## 2013-07-04 DIAGNOSIS — R0609 Other forms of dyspnea: Secondary | ICD-10-CM | POA: Diagnosis not present

## 2013-07-19 DIAGNOSIS — I059 Rheumatic mitral valve disease, unspecified: Secondary | ICD-10-CM | POA: Diagnosis not present

## 2013-07-19 DIAGNOSIS — I4891 Unspecified atrial fibrillation: Secondary | ICD-10-CM | POA: Diagnosis not present

## 2013-07-19 DIAGNOSIS — I119 Hypertensive heart disease without heart failure: Secondary | ICD-10-CM | POA: Diagnosis not present

## 2013-08-27 ENCOUNTER — Ambulatory Visit (INDEPENDENT_AMBULATORY_CARE_PROVIDER_SITE_OTHER): Payer: Medicare Other | Admitting: Internal Medicine

## 2013-08-30 DIAGNOSIS — I119 Hypertensive heart disease without heart failure: Secondary | ICD-10-CM | POA: Diagnosis not present

## 2013-08-30 DIAGNOSIS — I4891 Unspecified atrial fibrillation: Secondary | ICD-10-CM | POA: Diagnosis not present

## 2013-08-30 DIAGNOSIS — R0609 Other forms of dyspnea: Secondary | ICD-10-CM | POA: Diagnosis not present

## 2013-09-04 DIAGNOSIS — R0602 Shortness of breath: Secondary | ICD-10-CM | POA: Diagnosis not present

## 2013-09-11 ENCOUNTER — Ambulatory Visit (INDEPENDENT_AMBULATORY_CARE_PROVIDER_SITE_OTHER): Payer: Medicare Other | Admitting: Internal Medicine

## 2013-09-11 ENCOUNTER — Encounter (INDEPENDENT_AMBULATORY_CARE_PROVIDER_SITE_OTHER): Payer: Self-pay | Admitting: Internal Medicine

## 2013-09-11 VITALS — BP 130/80 | HR 76 | Temp 98.3°F | Resp 18 | Ht 60.0 in | Wt 154.0 lb

## 2013-09-11 DIAGNOSIS — D509 Iron deficiency anemia, unspecified: Secondary | ICD-10-CM | POA: Diagnosis not present

## 2013-09-11 MED ORDER — FLINTSTONES PLUS IRON PO CHEW: 1.0000 | CHEWABLE_TABLET | Freq: Two times a day (BID) | ORAL | Status: DC

## 2013-09-11 NOTE — Progress Notes (Signed)
Presenting complaint;  Followup for iron deficiency anemia and GI bleed.  Subjective:  [name] who presents for scheduled visit. She was hospitalized in June 2014 4 recurrent GI bleed and anemia and received 2 units of PRBCs. She underwent esophagogastroduodenoscopy on 04/23/2013 and no abnormality was noted. She has undergone complete workup in the past including EGD, colonoscopy and small bowel given capsule study in September 2012. Patient reports that she has not passing more blood per rectum on experienced tarry stools. She is getting her hemoglobin checked guaiac Dr. Oneita Jolly office. She has lost 6 pounds in the last 10 months. She states her appetite is up and down. She lives alone and takes care of her house other than cleaning. Her main complaints today are that she is not able to sleep at night because of jerking movement of her legs and she has intermittent shortness of breath. Dyspneic spells may last for a few days at a time. She has an appointment to see pulmonologist next week. She is not taking ferrous sulfate. She states that pills are too big.  Current Medications: Current Outpatient Prescriptions  Medication Sig Dispense Refill  . aspirin 81 MG tablet Take 81 mg by mouth daily.      . furosemide (LASIX) 40 MG tablet Take 1 tablet (40 mg total) by mouth daily.  30 tablet  0  . hydrALAZINE (APRESOLINE) 25 MG tablet Take 1 tablet (25 mg total) by mouth every 8 (eight) hours.  90 tablet  0  . metoprolol (LOPRESSOR) 50 MG tablet Take 50 mg by mouth 2 (two) times daily.        Marland Kitchen omeprazole (PRILOSEC) 20 MG capsule Take 20 mg by mouth daily.        . pravastatin (PRAVACHOL) 40 MG tablet Take 80 mg by mouth at bedtime.       Marland Kitchen warfarin (COUMADIN) 4 MG tablet Take 4-7.5 mg by mouth daily. 7.5 mg on Monday and Friday. 4 mg Tues, Wed, Thurs, Sat, and Sun       No current facility-administered medications for this visit.     Objective: Blood pressure  130/80, pulse 76, temperature 98.3 F (36.8 C), temperature source Oral, resp. rate 18, height 5' (1.524 m), weight 154 lb (69.854 kg). Patient is alert and in no acute distress. Conjunctiva is pink. Sclera is nonicteric Oropharyngeal mucosa is normal. No neck masses or thyromegaly noted. Cardiac exam with irregular rhythm normal S1 and S2. No murmur or gallop noted. Lungs are clear to auscultation. Abdomen is symmetrical soft and nontender without organomegaly or masses. No LE edema or clubbing noted.  Labs/studies Results: CBC from 07/02/2013. WBC 6.1, H&H 11.4 and 36.7 and platelet count 367K.  Assessment:  #1. Recurrent iron deficiency anemia most likely secondary to GI bleed from small bowel AV malformations in the setting of chronic anticoagulations along with aspirin use. Last H&H on 07/02/2013 close to normal. I doubt that her dyspnea is secondary to anemia. I wonder if her dyspneic spells are related to diastolic dysfunction. Pulmonary evaluation is pending and would shed further light into this symptom. She needs to be back on oral iron therapy. #2. GERD. Heartburn is well controlled with therapy.    Plan:  Patient advised to take Flintstones with iron 1 tablet by mouth twice a day. He will continue to get H&H checked every 3-4 months via PCPs office. If she has another episode of melena or rectal bleeding she will report to the emergency room  since she is anticoagulated. Office visit on an as-needed basis.

## 2013-09-11 NOTE — Patient Instructions (Signed)
Call if you have tarry stool or rectal bleeding. Flintstones with iron to 1 tablet twice daily.

## 2013-09-24 DIAGNOSIS — Z23 Encounter for immunization: Secondary | ICD-10-CM | POA: Diagnosis not present

## 2013-10-02 DIAGNOSIS — Z961 Presence of intraocular lens: Secondary | ICD-10-CM | POA: Diagnosis not present

## 2013-10-04 DIAGNOSIS — D649 Anemia, unspecified: Secondary | ICD-10-CM | POA: Diagnosis not present

## 2013-10-04 DIAGNOSIS — E782 Mixed hyperlipidemia: Secondary | ICD-10-CM | POA: Diagnosis not present

## 2013-10-04 DIAGNOSIS — I119 Hypertensive heart disease without heart failure: Secondary | ICD-10-CM | POA: Diagnosis not present

## 2013-10-04 DIAGNOSIS — I4891 Unspecified atrial fibrillation: Secondary | ICD-10-CM | POA: Diagnosis not present

## 2013-10-04 DIAGNOSIS — D510 Vitamin B12 deficiency anemia due to intrinsic factor deficiency: Secondary | ICD-10-CM | POA: Diagnosis not present

## 2013-10-04 DIAGNOSIS — I2510 Atherosclerotic heart disease of native coronary artery without angina pectoris: Secondary | ICD-10-CM | POA: Diagnosis not present

## 2013-10-22 DIAGNOSIS — D649 Anemia, unspecified: Secondary | ICD-10-CM | POA: Diagnosis not present

## 2013-10-22 DIAGNOSIS — K219 Gastro-esophageal reflux disease without esophagitis: Secondary | ICD-10-CM | POA: Diagnosis not present

## 2013-10-22 DIAGNOSIS — I10 Essential (primary) hypertension: Secondary | ICD-10-CM | POA: Diagnosis not present

## 2013-10-25 DIAGNOSIS — I4892 Unspecified atrial flutter: Secondary | ICD-10-CM | POA: Diagnosis not present

## 2013-10-25 DIAGNOSIS — G4730 Sleep apnea, unspecified: Secondary | ICD-10-CM | POA: Diagnosis not present

## 2013-10-25 DIAGNOSIS — I2510 Atherosclerotic heart disease of native coronary artery without angina pectoris: Secondary | ICD-10-CM | POA: Diagnosis not present

## 2013-10-25 DIAGNOSIS — R0602 Shortness of breath: Secondary | ICD-10-CM | POA: Diagnosis not present

## 2013-10-25 DIAGNOSIS — J984 Other disorders of lung: Secondary | ICD-10-CM | POA: Diagnosis not present

## 2013-10-30 DIAGNOSIS — I119 Hypertensive heart disease without heart failure: Secondary | ICD-10-CM | POA: Diagnosis not present

## 2013-10-30 DIAGNOSIS — I2510 Atherosclerotic heart disease of native coronary artery without angina pectoris: Secondary | ICD-10-CM | POA: Diagnosis not present

## 2013-10-30 DIAGNOSIS — I4892 Unspecified atrial flutter: Secondary | ICD-10-CM | POA: Diagnosis not present

## 2013-10-30 DIAGNOSIS — E785 Hyperlipidemia, unspecified: Secondary | ICD-10-CM | POA: Diagnosis not present

## 2013-10-30 DIAGNOSIS — I4891 Unspecified atrial fibrillation: Secondary | ICD-10-CM | POA: Diagnosis not present

## 2013-11-22 DIAGNOSIS — I4892 Unspecified atrial flutter: Secondary | ICD-10-CM | POA: Diagnosis not present

## 2013-11-22 DIAGNOSIS — I4891 Unspecified atrial fibrillation: Secondary | ICD-10-CM | POA: Diagnosis not present

## 2013-11-30 DIAGNOSIS — J984 Other disorders of lung: Secondary | ICD-10-CM | POA: Diagnosis not present

## 2013-11-30 DIAGNOSIS — R0602 Shortness of breath: Secondary | ICD-10-CM | POA: Diagnosis not present

## 2013-12-06 DIAGNOSIS — I4892 Unspecified atrial flutter: Secondary | ICD-10-CM | POA: Diagnosis not present

## 2013-12-06 DIAGNOSIS — I4891 Unspecified atrial fibrillation: Secondary | ICD-10-CM | POA: Diagnosis not present

## 2013-12-06 DIAGNOSIS — I059 Rheumatic mitral valve disease, unspecified: Secondary | ICD-10-CM | POA: Diagnosis not present

## 2013-12-16 DIAGNOSIS — G4730 Sleep apnea, unspecified: Secondary | ICD-10-CM | POA: Diagnosis not present

## 2013-12-31 DIAGNOSIS — R072 Precordial pain: Secondary | ICD-10-CM | POA: Diagnosis not present

## 2013-12-31 DIAGNOSIS — I4891 Unspecified atrial fibrillation: Secondary | ICD-10-CM | POA: Diagnosis not present

## 2013-12-31 DIAGNOSIS — I2510 Atherosclerotic heart disease of native coronary artery without angina pectoris: Secondary | ICD-10-CM | POA: Diagnosis not present

## 2013-12-31 DIAGNOSIS — J449 Chronic obstructive pulmonary disease, unspecified: Secondary | ICD-10-CM | POA: Diagnosis not present

## 2014-01-08 DIAGNOSIS — I059 Rheumatic mitral valve disease, unspecified: Secondary | ICD-10-CM | POA: Diagnosis not present

## 2014-01-08 DIAGNOSIS — I4892 Unspecified atrial flutter: Secondary | ICD-10-CM | POA: Diagnosis not present

## 2014-01-08 DIAGNOSIS — I2510 Atherosclerotic heart disease of native coronary artery without angina pectoris: Secondary | ICD-10-CM | POA: Diagnosis not present

## 2014-01-08 DIAGNOSIS — I369 Nonrheumatic tricuspid valve disorder, unspecified: Secondary | ICD-10-CM | POA: Diagnosis not present

## 2014-01-09 DIAGNOSIS — I059 Rheumatic mitral valve disease, unspecified: Secondary | ICD-10-CM | POA: Diagnosis not present

## 2014-01-09 DIAGNOSIS — I428 Other cardiomyopathies: Secondary | ICD-10-CM | POA: Diagnosis not present

## 2014-01-14 DIAGNOSIS — J449 Chronic obstructive pulmonary disease, unspecified: Secondary | ICD-10-CM | POA: Diagnosis not present

## 2014-01-14 DIAGNOSIS — J984 Other disorders of lung: Secondary | ICD-10-CM | POA: Diagnosis not present

## 2014-01-14 DIAGNOSIS — I4892 Unspecified atrial flutter: Secondary | ICD-10-CM | POA: Diagnosis not present

## 2014-01-28 DIAGNOSIS — I059 Rheumatic mitral valve disease, unspecified: Secondary | ICD-10-CM | POA: Diagnosis not present

## 2014-01-28 DIAGNOSIS — I4892 Unspecified atrial flutter: Secondary | ICD-10-CM | POA: Diagnosis not present

## 2014-01-28 DIAGNOSIS — I4891 Unspecified atrial fibrillation: Secondary | ICD-10-CM | POA: Diagnosis not present

## 2014-02-25 DIAGNOSIS — I4891 Unspecified atrial fibrillation: Secondary | ICD-10-CM | POA: Diagnosis not present

## 2014-02-25 DIAGNOSIS — I059 Rheumatic mitral valve disease, unspecified: Secondary | ICD-10-CM | POA: Diagnosis not present

## 2014-02-25 DIAGNOSIS — I4892 Unspecified atrial flutter: Secondary | ICD-10-CM | POA: Diagnosis not present

## 2014-03-07 DIAGNOSIS — R0609 Other forms of dyspnea: Secondary | ICD-10-CM | POA: Diagnosis not present

## 2014-03-07 DIAGNOSIS — I2510 Atherosclerotic heart disease of native coronary artery without angina pectoris: Secondary | ICD-10-CM | POA: Diagnosis not present

## 2014-03-07 DIAGNOSIS — R0789 Other chest pain: Secondary | ICD-10-CM | POA: Diagnosis not present

## 2014-03-07 DIAGNOSIS — R0602 Shortness of breath: Secondary | ICD-10-CM | POA: Diagnosis not present

## 2014-03-07 DIAGNOSIS — I10 Essential (primary) hypertension: Secondary | ICD-10-CM | POA: Diagnosis not present

## 2014-03-07 DIAGNOSIS — I4891 Unspecified atrial fibrillation: Secondary | ICD-10-CM | POA: Diagnosis not present

## 2014-03-07 DIAGNOSIS — R079 Chest pain, unspecified: Secondary | ICD-10-CM | POA: Diagnosis not present

## 2014-03-07 DIAGNOSIS — R0989 Other specified symptoms and signs involving the circulatory and respiratory systems: Secondary | ICD-10-CM | POA: Diagnosis not present

## 2014-03-07 DIAGNOSIS — R072 Precordial pain: Secondary | ICD-10-CM | POA: Diagnosis not present

## 2014-03-18 DIAGNOSIS — I4891 Unspecified atrial fibrillation: Secondary | ICD-10-CM | POA: Diagnosis not present

## 2014-03-18 DIAGNOSIS — I4892 Unspecified atrial flutter: Secondary | ICD-10-CM | POA: Diagnosis not present

## 2014-03-22 DIAGNOSIS — Z1231 Encounter for screening mammogram for malignant neoplasm of breast: Secondary | ICD-10-CM | POA: Diagnosis not present

## 2014-03-28 DIAGNOSIS — J449 Chronic obstructive pulmonary disease, unspecified: Secondary | ICD-10-CM | POA: Diagnosis not present

## 2014-03-28 DIAGNOSIS — Z853 Personal history of malignant neoplasm of breast: Secondary | ICD-10-CM | POA: Diagnosis not present

## 2014-04-16 DIAGNOSIS — R072 Precordial pain: Secondary | ICD-10-CM | POA: Diagnosis not present

## 2014-04-16 DIAGNOSIS — I2510 Atherosclerotic heart disease of native coronary artery without angina pectoris: Secondary | ICD-10-CM | POA: Diagnosis not present

## 2014-04-16 DIAGNOSIS — R002 Palpitations: Secondary | ICD-10-CM | POA: Diagnosis not present

## 2014-04-16 DIAGNOSIS — I4891 Unspecified atrial fibrillation: Secondary | ICD-10-CM | POA: Diagnosis not present

## 2014-04-19 DIAGNOSIS — E119 Type 2 diabetes mellitus without complications: Secondary | ICD-10-CM | POA: Diagnosis not present

## 2014-04-19 DIAGNOSIS — I2510 Atherosclerotic heart disease of native coronary artery without angina pectoris: Secondary | ICD-10-CM | POA: Diagnosis not present

## 2014-04-19 DIAGNOSIS — E785 Hyperlipidemia, unspecified: Secondary | ICD-10-CM | POA: Diagnosis not present

## 2014-04-29 DIAGNOSIS — I4891 Unspecified atrial fibrillation: Secondary | ICD-10-CM | POA: Diagnosis not present

## 2014-04-29 DIAGNOSIS — K219 Gastro-esophageal reflux disease without esophagitis: Secondary | ICD-10-CM | POA: Diagnosis not present

## 2014-04-29 DIAGNOSIS — D649 Anemia, unspecified: Secondary | ICD-10-CM | POA: Diagnosis not present

## 2014-05-01 ENCOUNTER — Encounter (INDEPENDENT_AMBULATORY_CARE_PROVIDER_SITE_OTHER): Payer: Self-pay | Admitting: Internal Medicine

## 2014-05-01 ENCOUNTER — Telehealth (INDEPENDENT_AMBULATORY_CARE_PROVIDER_SITE_OTHER): Payer: Self-pay | Admitting: *Deleted

## 2014-05-01 ENCOUNTER — Ambulatory Visit (INDEPENDENT_AMBULATORY_CARE_PROVIDER_SITE_OTHER): Payer: Medicare Other | Admitting: Internal Medicine

## 2014-05-01 VITALS — BP 120/70 | HR 56 | Temp 97.6°F | Ht 60.0 in | Wt 144.9 lb

## 2014-05-01 DIAGNOSIS — D62 Acute posthemorrhagic anemia: Secondary | ICD-10-CM

## 2014-05-01 DIAGNOSIS — D649 Anemia, unspecified: Secondary | ICD-10-CM

## 2014-05-01 NOTE — Progress Notes (Addendum)
Subjective:     Patient ID: Yesenia Woods, female   DOB: [birth year], 79 y.o.   MRN: [id number]  HPI Presents today with low Hemoglobin.  She is on Home 02. She is not eating okay. She says she has lost about 15 pounds since her last visit in 2013 She usually has a BM 1-2 times a day. Stools are brown in color. She has not seen any blood or melena.  Presently taking Flintstone Vitamin plus iron.  Hx of anemia and has had an extensive work up in the past. She has been transfused in the past for anemia. Last transfusion was in June of 2014.  PCP Dr.Oberoi  04/2013 H. Pylori positive: I am not sure if she was ever treated.  04/20/2014 H andH 8.8 and 30.2, Platelet ct 557. MCV 57.9 CBC    Component Value Date/Time   WBC 12.3* 04/24/2013 0557   RBC 5.51* 04/24/2013 0557   RBC 3.85* 07/19/2011 1420   HGB 11.6* 04/24/2013 0557   HCT 36.7 04/24/2013 0557   PLT PLATELET CLUMPS NOTED ON SMEAR, COUNT APPEARS INCREASED 04/24/2013 0557   MCV 66.6* 04/24/2013 0557   MCH 21.1* 04/24/2013 0557   MCHC 31.6 04/24/2013 0557   RDW 24.1* 04/24/2013 0557   LYMPHSABS 1.4 04/20/2013 0956   MONOABS 1.1* 04/20/2013 0956   EOSABS 0.1 04/20/2013 0956   BASOSABS 0.0 04/20/2013 0956   04/23/2013 EGD: chest pain, anemia. Guaiac positive stool Impression:  Normal esophagogastroduodenoscopy.  No abnormality noted to account for patient's chest pain or GI bleed.   07/30/2011 Given's Capsule:  FINAL DIAGNOSES:  1. Hyperemia to gastric mucosa without stigmata of bleeding.  2. Small duodenal ulcer without stigmata of bleeding.  3. 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  4. Capsule passed into the cecum at approximately 3 hours and 49  minutes.  5. She could be losing blood intermittently from jejunal arteriovenous  malformations and could have lost blood from this ulcer which was  not seen on recent EGD.  6. These findings were reviewed with the patient over the phone.  07/19/2011 EGD:  Colonoscopy: FINAL DIAGNOSES:  1. Normal Esophagogastroduodenoscopy.  2. 11-12 mm sessile polyp snared piecemeal from cecum with saline  assist. Polypectomy was complete.  4. External hemorrhoids and single anal papilla.  5. No bleeding lesion identified, however. Biopsy: Tubular adenoma.   Review of Systems Past Medical History  Diagnosis Date  . Myocardial infarct   . Cancer     breast  . Hypertension   . Shortness of breath   . Anemia   . Headache(784.0)   . Arthritis     Past Surgical History  Procedure Laterality Date  . Coronary artery bypass graft    . Eye surgery    . Breast surgery      Left mastectomy  . Right hand surgery    . Esophagogastroduodenoscopy  07/22/2011    Procedure: ESOPHAGOGASTRODUODENOSCOPY (EGD);  Surgeon: Rogene Houston, MD;  Location: [facility];  Service: Endoscopy;  Laterality: N/A;  . Colonoscopy  07/22/2011    Procedure: COLONOSCOPY;  Surgeon: Rogene Houston, MD;  Location: [facility];  Service: Endoscopy;  Laterality: N/A;  . Givens capsule study  07/30/2011    Procedure: GIVENS CAPSULE STUDY;  Surgeon: Rogene Houston, MD;  Location: [facility];  Service: Endoscopy;  Laterality: N/A;  7:30  . Esophagogastroduodenoscopy N/A 04/23/2013    Procedure: ESOPHAGOGASTRODUODENOSCOPY (EGD);  Surgeon: Rogene Houston, MD;  Location: [facility];  Service: Endoscopy;  Laterality: N/A;    No Known Allergies  Current Outpatient Prescriptions on File Prior to Visit  Medication Sig Dispense Refill  . aspirin 81 MG tablet Take 81 mg by mouth daily.      . furosemide (LASIX) 40 MG tablet Take 1 tablet (40 mg total) by mouth daily.  30 tablet  0  . metoprolol (LOPRESSOR) 50 MG tablet Take 50 mg by mouth 2 (two) times daily.        . Pediatric Multivitamins-Iron (FLINTSTONES PLUS IRON) chewable tablet Chew 1 tablet by mouth 2 (two) times daily.      . pravastatin (PRAVACHOL) 40 MG tablet Take 80 mg by mouth at bedtime.       . hydrALAZINE  (APRESOLINE) 25 MG tablet Take 1 tablet (25 mg total) by mouth every 8 (eight) hours.  90 tablet  0  . omeprazole (PRILOSEC) 20 MG capsule Take 20 mg by mouth 2 (two) times daily before a meal. 1/2 pill atwice a day.      . warfarin (COUMADIN) 4 MG tablet Take 4-7.5 mg by mouth daily. 7.5 mg Rest of days 4 mg Mondays and Friday       No current facility-administered medications on file prior to visit.        Objective:   Physical Exam  Filed Vitals:   05/01/14 1046  BP: 120/70  Pulse: 56  Temp: 97.6 F (36.4 C)  Height: 5' (1.524 m)  Weight: 144 lb 14.4 oz (65.726 kg)  Alert and oriented. Skin warm and dry. Oral mucosa is moist.   . Sclera anicteric, conjunctivae is pink. Thyroid not enlarged. No cervical lymphadenopathy. Lungs clear. Heart regular rate and rhythm.  Abdomen is soft. Bowel sounds are positive. No hepatomegaly. No abdominal masses felt. No tenderness.  No edema to lower extremities.  Stool brown and guaiac negative today.       Assessment:     Anemia. Hx of same in past. Stool was guaiac negative today.  I discussed with Dr. Laural Golden    Plan:     H and H in 1 week. If frank bleeding or melena, she should call our office immediately. Stop Meloxicam. ? Whether she was treated for H. Pylori. Patient could not tell me.

## 2014-05-01 NOTE — Patient Instructions (Addendum)
H and H in one week. If frank bleeding or black stools, call our office or go to the emergency dept.  Stop the Meloxicam

## 2014-05-01 NOTE — Telephone Encounter (Signed)
.  Per Yesenia Woods patient to have labs in 1 week.

## 2014-05-03 ENCOUNTER — Encounter (INDEPENDENT_AMBULATORY_CARE_PROVIDER_SITE_OTHER): Payer: Self-pay

## 2014-05-07 DIAGNOSIS — I4891 Unspecified atrial fibrillation: Secondary | ICD-10-CM | POA: Diagnosis not present

## 2014-05-08 DIAGNOSIS — D649 Anemia, unspecified: Secondary | ICD-10-CM | POA: Diagnosis not present

## 2014-05-08 LAB — HEMOGLOBIN AND HEMATOCRIT, BLOOD
HCT: 30 % — ABNORMAL LOW (ref 36.0–46.0)
Hemoglobin: 8.5 g/dL — ABNORMAL LOW (ref 12.0–15.0)

## 2014-05-09 ENCOUNTER — Telehealth (INDEPENDENT_AMBULATORY_CARE_PROVIDER_SITE_OTHER): Payer: Self-pay | Admitting: *Deleted

## 2014-05-09 NOTE — Telephone Encounter (Signed)
Feraheme Repeat 1 week CBC,Serum Ferritin 1 week after 2 nd Infusion.

## 2014-05-10 NOTE — Telephone Encounter (Signed)
The has been completed , however, the Yesenia Woods is not covered on the patient's insurance. Patient will be responsible for the whole bill. I have left a note for Terri to review and advise what she wants to do.

## 2014-05-13 ENCOUNTER — Other Ambulatory Visit (INDEPENDENT_AMBULATORY_CARE_PROVIDER_SITE_OTHER): Payer: Self-pay | Admitting: *Deleted

## 2014-05-13 ENCOUNTER — Telehealth (INDEPENDENT_AMBULATORY_CARE_PROVIDER_SITE_OTHER): Payer: Self-pay | Admitting: *Deleted

## 2014-05-13 ENCOUNTER — Encounter (INDEPENDENT_AMBULATORY_CARE_PROVIDER_SITE_OTHER): Payer: Self-pay | Admitting: *Deleted

## 2014-05-13 DIAGNOSIS — D649 Anemia, unspecified: Secondary | ICD-10-CM

## 2014-05-13 NOTE — Telephone Encounter (Signed)
.  Per Lelon Perla patient to have lab work done in 2 weeks.

## 2014-05-27 DIAGNOSIS — D649 Anemia, unspecified: Secondary | ICD-10-CM | POA: Diagnosis not present

## 2014-05-27 LAB — CBC
HCT: 34.8 % — ABNORMAL LOW (ref 36.0–46.0)
Hemoglobin: 10.2 g/dL — ABNORMAL LOW (ref 12.0–15.0)
MCH: 17.3 pg — AB (ref 26.0–34.0)
MCHC: 29.3 g/dL — ABNORMAL LOW (ref 30.0–36.0)
MCV: 59.1 fL — AB (ref 78.0–100.0)
PLATELETS: 544 10*3/uL — AB (ref 150–400)
RBC: 5.89 MIL/uL — ABNORMAL HIGH (ref 3.87–5.11)
RDW: 26.7 % — AB (ref 11.5–15.5)
WBC: 7.7 10*3/uL (ref 4.0–10.5)

## 2014-05-28 ENCOUNTER — Telehealth (INDEPENDENT_AMBULATORY_CARE_PROVIDER_SITE_OTHER): Payer: Self-pay | Admitting: *Deleted

## 2014-05-28 DIAGNOSIS — D509 Iron deficiency anemia, unspecified: Secondary | ICD-10-CM

## 2014-05-28 NOTE — Telephone Encounter (Signed)
.  Per Lelon Perla patient to have lab drawn in 1 month, noted for 07/08/14.

## 2014-06-03 DIAGNOSIS — I4891 Unspecified atrial fibrillation: Secondary | ICD-10-CM | POA: Diagnosis not present

## 2014-06-05 ENCOUNTER — Encounter (INDEPENDENT_AMBULATORY_CARE_PROVIDER_SITE_OTHER): Payer: Self-pay | Admitting: *Deleted

## 2014-06-05 ENCOUNTER — Other Ambulatory Visit (INDEPENDENT_AMBULATORY_CARE_PROVIDER_SITE_OTHER): Payer: Self-pay | Admitting: *Deleted

## 2014-06-05 DIAGNOSIS — D509 Iron deficiency anemia, unspecified: Secondary | ICD-10-CM

## 2014-06-24 DIAGNOSIS — I4891 Unspecified atrial fibrillation: Secondary | ICD-10-CM | POA: Diagnosis not present

## 2014-07-05 DIAGNOSIS — D509 Iron deficiency anemia, unspecified: Secondary | ICD-10-CM | POA: Diagnosis not present

## 2014-07-05 LAB — CBC
HCT: 41.3 % (ref 36.0–46.0)
HEMOGLOBIN: 12.9 g/dL (ref 12.0–15.0)
MCH: 20.6 pg — ABNORMAL LOW (ref 26.0–34.0)
MCHC: 31.2 g/dL (ref 30.0–36.0)
MCV: 66.1 fL — AB (ref 78.0–100.0)
Platelets: 409 10*3/uL — ABNORMAL HIGH (ref 150–400)
RBC: 6.25 MIL/uL — ABNORMAL HIGH (ref 3.87–5.11)
RDW: 27.2 % — ABNORMAL HIGH (ref 11.5–15.5)
WBC: 8.6 10*3/uL (ref 4.0–10.5)

## 2014-07-09 ENCOUNTER — Encounter (INDEPENDENT_AMBULATORY_CARE_PROVIDER_SITE_OTHER): Payer: Self-pay | Admitting: Internal Medicine

## 2014-07-09 ENCOUNTER — Ambulatory Visit (INDEPENDENT_AMBULATORY_CARE_PROVIDER_SITE_OTHER): Payer: Medicare Other | Admitting: Internal Medicine

## 2014-07-09 VITALS — BP 140/86 | HR 60 | Temp 97.7°F | Resp 18 | Ht 60.0 in | Wt 142.4 lb

## 2014-07-09 DIAGNOSIS — K219 Gastro-esophageal reflux disease without esophagitis: Secondary | ICD-10-CM

## 2014-07-09 DIAGNOSIS — D509 Iron deficiency anemia, unspecified: Secondary | ICD-10-CM

## 2014-07-09 DIAGNOSIS — Z8719 Personal history of other diseases of the digestive system: Secondary | ICD-10-CM

## 2014-07-09 NOTE — Patient Instructions (Addendum)
Blood work( CBC) 3 month for 3 months. Tylenol 500 mg by mouth twice daily as needed for joint pain in her headache. These remember you cannot take ibuprofen, Advil, Mobic or similar medications.

## 2014-07-09 NOTE — Progress Notes (Signed)
Presenting complaint;  Follow for iron deficiency anemia, GI bleed and GERD.  Subjective:  Patient is 79 year old African female with multiple medical problems who is here for scheduled visit. She has history of iron deficiency anemia secondary to GI bleed dating back to over 3 years ago. Over 2 months ago she was noted to have low hemoglobin of 8.5 g and sent over for reevaluation. She was noted to have heme-positive stool. She wasn't meloxicam which was discontinued. She had hemoglobin 5 weeks ago and was up to 10.2 g. She has no complaints other than fatigue. She states she's been on nasal O2 for about a year. She denies nausea vomiting or dysphagia. She states heartburn is well controlled with therapy. She believes she is not taking meloxicam she is not 100% sure. Her stools have always been dioxin she's been on iron. She denies abdominal pain or rectal bleeding.   Current Medications: Outpatient Encounter Prescriptions as of 07/09/2014  Medication Sig  . albuterol (PROVENTIL) (2.5 MG/3ML) 0.083% nebulizer solution Take 2.5 mg by nebulization 2 (two) times daily before a meal.  . aspirin 81 MG tablet Take 81 mg by mouth daily.  Marland Kitchen BREO ELLIPTA 100-25 MCG/INH AEPB Patient states that she uses as directed  . ferrous sulfate 325 (65 FE) MG tablet Take 325 mg by mouth daily with breakfast.  . furosemide (LASIX) 40 MG tablet Take 1 tablet (40 mg total) by mouth daily.  . meloxicam (MOBIC) 15 MG tablet Take 15 mg by mouth daily.  . metoprolol (LOPRESSOR) 50 MG tablet Take 50 mg by mouth 2 (two) times daily.    Marland Kitchen omeprazole (PRILOSEC) 20 MG capsule Take 20 mg by mouth 2 (two) times daily before a meal. 1/2 pill atwice a day.  . pravastatin (PRAVACHOL) 40 MG tablet Take 80 mg by mouth at bedtime.   Marland Kitchen warfarin (COUMADIN) 4 MG tablet Take 4-7.5 mg by mouth daily. 7.5 by mouth everyday but one and she takes 4 mg by mouth on this day  . hydrALAZINE (APRESOLINE) 25 MG tablet Take 1 tablet (25 mg total)  by mouth every 8 (eight) hours.  . [DISCONTINUED] Pediatric Multivitamins-Iron (FLINTSTONES PLUS IRON) chewable tablet Chew 1 tablet by mouth 2 (two) times daily.     Objective: Blood pressure 140/86, pulse 60, temperature 97.7 F (36.5 C), temperature source Oral, resp. rate 18, height 5' (1.524 m), weight 142 lb 6.4 oz (64.592 kg). Patient is alert and in no acute distress. She is on nasal O2. Conjunctiva is pink. Sclera is nonicteric Oropharyngeal mucosa is normal. No neck masses or thyromegaly noted. Cardiac exam with iregular rhythm normal S1 and S2. No murmur or gallop noted. Lungs are clear to auscultation. Abdomen is soft and nontender without organomegaly or masses.  No LE edema or clubbing noted.  Labs/studies Results: CBC from 07/05/2014. WBC 8.6, H&H 12.9 and 41.3; platelet count 409K. H&H was 10.2 and 34.8 on 05/27/2014  H&H was 8.5 and 30.0 on 05/08/2014    Assessment:  #1. History of well-documented iron deficiency anemia secondary to chronic GI bleed. Over 3 years ago she was found to have bulbar ulcer which was subsequently documented to have healed. 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 Drop in H&H 2 months ago could have been secondary to NSAID use. H&H now is normal which has not been the case in 3 years. #2. GERD. Heartburn is well controlled with therapy.  Plan: Patient will continue ferrous sulfate 325 mg by mouth daily. Meloxicam taken off the patient's list of medications. Patient advised not to take OTC NSAIDs other than low dose aspirin. She can take Tylenol 500 mg by mouth twice a day as needed for musculoskeletal pain or headache. CBC every month x 3. Office visit in 3 months. Patient will call if she has rectal bleeding or develops progressive. weakness

## 2014-07-17 DIAGNOSIS — R0602 Shortness of breath: Secondary | ICD-10-CM | POA: Diagnosis not present

## 2014-07-17 DIAGNOSIS — J984 Other disorders of lung: Secondary | ICD-10-CM | POA: Diagnosis not present

## 2014-07-17 DIAGNOSIS — J449 Chronic obstructive pulmonary disease, unspecified: Secondary | ICD-10-CM | POA: Diagnosis not present

## 2014-07-17 DIAGNOSIS — I4892 Unspecified atrial flutter: Secondary | ICD-10-CM | POA: Diagnosis not present

## 2014-07-29 DIAGNOSIS — I4891 Unspecified atrial fibrillation: Secondary | ICD-10-CM | POA: Diagnosis not present

## 2014-08-30 DIAGNOSIS — I4891 Unspecified atrial fibrillation: Secondary | ICD-10-CM | POA: Diagnosis not present

## 2014-08-30 DIAGNOSIS — I7090 Unspecified atherosclerosis: Secondary | ICD-10-CM | POA: Diagnosis not present

## 2014-08-30 DIAGNOSIS — I482 Chronic atrial fibrillation: Secondary | ICD-10-CM | POA: Diagnosis not present

## 2014-08-30 DIAGNOSIS — I071 Rheumatic tricuspid insufficiency: Secondary | ICD-10-CM | POA: Diagnosis not present

## 2014-08-30 DIAGNOSIS — I340 Nonrheumatic mitral (valve) insufficiency: Secondary | ICD-10-CM | POA: Diagnosis not present

## 2014-10-14 ENCOUNTER — Ambulatory Visit (INDEPENDENT_AMBULATORY_CARE_PROVIDER_SITE_OTHER): Payer: Medicare Other | Admitting: Internal Medicine

## 2014-10-14 ENCOUNTER — Encounter (HOSPITAL_COMMUNITY): Payer: Self-pay | Admitting: *Deleted

## 2014-10-14 ENCOUNTER — Emergency Department (HOSPITAL_COMMUNITY): Payer: Medicare Other

## 2014-10-14 ENCOUNTER — Encounter (INDEPENDENT_AMBULATORY_CARE_PROVIDER_SITE_OTHER): Payer: Self-pay | Admitting: Internal Medicine

## 2014-10-14 ENCOUNTER — Inpatient Hospital Stay (HOSPITAL_COMMUNITY)
Admission: EM | Admit: 2014-10-14 | Discharge: 2014-10-16 | DRG: 812 | Disposition: A | Discharge status: Home / Self Care | Payer: Medicare Other | Attending: Internal Medicine | Admitting: Internal Medicine

## 2014-10-14 VITALS — BP 118/72 | HR 66 | Temp 97.3°F | Resp 18 | Ht 60.0 in | Wt 141.4 lb

## 2014-10-14 DIAGNOSIS — I252 Old myocardial infarction: Secondary | ICD-10-CM

## 2014-10-14 DIAGNOSIS — R0602 Shortness of breath: Secondary | ICD-10-CM | POA: Insufficient documentation

## 2014-10-14 DIAGNOSIS — I4891 Unspecified atrial fibrillation: Secondary | ICD-10-CM | POA: Diagnosis present

## 2014-10-14 DIAGNOSIS — Q2733 Arteriovenous malformation of digestive system vessel: Secondary | ICD-10-CM | POA: Diagnosis not present

## 2014-10-14 DIAGNOSIS — E785 Hyperlipidemia, unspecified: Secondary | ICD-10-CM | POA: Diagnosis present

## 2014-10-14 DIAGNOSIS — I10 Essential (primary) hypertension: Secondary | ICD-10-CM | POA: Diagnosis present

## 2014-10-14 DIAGNOSIS — R079 Chest pain, unspecified: Secondary | ICD-10-CM

## 2014-10-14 DIAGNOSIS — D509 Iron deficiency anemia, unspecified: Secondary | ICD-10-CM

## 2014-10-14 DIAGNOSIS — I501 Left ventricular failure: Secondary | ICD-10-CM | POA: Diagnosis not present

## 2014-10-14 DIAGNOSIS — Z8719 Personal history of other diseases of the digestive system: Secondary | ICD-10-CM

## 2014-10-14 DIAGNOSIS — Z7901 Long term (current) use of anticoagulants: Secondary | ICD-10-CM

## 2014-10-14 DIAGNOSIS — D638 Anemia in other chronic diseases classified elsewhere: Secondary | ICD-10-CM | POA: Diagnosis not present

## 2014-10-14 DIAGNOSIS — D649 Anemia, unspecified: Principal | ICD-10-CM | POA: Diagnosis present

## 2014-10-14 DIAGNOSIS — R072 Precordial pain: Secondary | ICD-10-CM | POA: Diagnosis not present

## 2014-10-14 DIAGNOSIS — D6489 Other specified anemias: Secondary | ICD-10-CM | POA: Diagnosis not present

## 2014-10-14 DIAGNOSIS — Z8711 Personal history of peptic ulcer disease: Secondary | ICD-10-CM

## 2014-10-14 DIAGNOSIS — Z951 Presence of aortocoronary bypass graft: Secondary | ICD-10-CM | POA: Diagnosis not present

## 2014-10-14 DIAGNOSIS — Z87891 Personal history of nicotine dependence: Secondary | ICD-10-CM | POA: Diagnosis not present

## 2014-10-14 DIAGNOSIS — I482 Chronic atrial fibrillation: Secondary | ICD-10-CM | POA: Diagnosis present

## 2014-10-14 DIAGNOSIS — E876 Hypokalemia: Secondary | ICD-10-CM | POA: Diagnosis present

## 2014-10-14 DIAGNOSIS — E860 Dehydration: Secondary | ICD-10-CM | POA: Diagnosis present

## 2014-10-14 DIAGNOSIS — K5520 Angiodysplasia of colon without hemorrhage: Secondary | ICD-10-CM

## 2014-10-14 DIAGNOSIS — R0789 Other chest pain: Secondary | ICD-10-CM | POA: Diagnosis not present

## 2014-10-14 DIAGNOSIS — I509 Heart failure, unspecified: Secondary | ICD-10-CM | POA: Diagnosis not present

## 2014-10-14 DIAGNOSIS — I2510 Atherosclerotic heart disease of native coronary artery without angina pectoris: Secondary | ICD-10-CM | POA: Diagnosis present

## 2014-10-14 DIAGNOSIS — I517 Cardiomegaly: Secondary | ICD-10-CM | POA: Diagnosis not present

## 2014-10-14 DIAGNOSIS — M1990 Unspecified osteoarthritis, unspecified site: Secondary | ICD-10-CM | POA: Diagnosis present

## 2014-10-14 DIAGNOSIS — Z7982 Long term (current) use of aspirin: Secondary | ICD-10-CM | POA: Diagnosis not present

## 2014-10-14 DIAGNOSIS — K219 Gastro-esophageal reflux disease without esophagitis: Secondary | ICD-10-CM | POA: Diagnosis present

## 2014-10-14 DIAGNOSIS — D500 Iron deficiency anemia secondary to blood loss (chronic): Secondary | ICD-10-CM | POA: Diagnosis not present

## 2014-10-14 HISTORY — DX: Duodenal ulcer, unspecified as acute or chronic, without hemorrhage or perforation: K26.9

## 2014-10-14 HISTORY — DX: Angiodysplasia of colon without hemorrhage: K55.20

## 2014-10-14 LAB — CBC WITH DIFFERENTIAL/PLATELET
BASOS PCT: 1 % (ref 0–1)
Basophils Absolute: 0.1 10*3/uL (ref 0.0–0.1)
Eosinophils Absolute: 0.1 10*3/uL (ref 0.0–0.7)
Eosinophils Relative: 1 % (ref 0–5)
HCT: 23.4 % — ABNORMAL LOW (ref 36.0–46.0)
Hemoglobin: 7.2 g/dL — ABNORMAL LOW (ref 12.0–15.0)
Lymphocytes Relative: 22 % (ref 12–46)
Lymphs Abs: 1.5 10*3/uL (ref 0.7–4.0)
MCH: 20.5 pg — ABNORMAL LOW (ref 26.0–34.0)
MCHC: 30.8 g/dL (ref 30.0–36.0)
MCV: 66.7 fL — ABNORMAL LOW (ref 78.0–100.0)
Monocytes Absolute: 0.9 10*3/uL (ref 0.1–1.0)
Monocytes Relative: 13 % — ABNORMAL HIGH (ref 3–12)
NEUTROS ABS: 4.3 10*3/uL (ref 1.7–7.7)
NEUTROS PCT: 63 % (ref 43–77)
Platelets: 442 10*3/uL — ABNORMAL HIGH (ref 150–400)
RBC: 3.51 MIL/uL — ABNORMAL LOW (ref 3.87–5.11)
RDW: 19.8 % — AB (ref 11.5–15.5)
WBC: 6.8 10*3/uL (ref 4.0–10.5)

## 2014-10-14 LAB — BASIC METABOLIC PANEL
ANION GAP: 14 (ref 5–15)
BUN: 13 mg/dL (ref 6–23)
CHLORIDE: 106 meq/L (ref 96–112)
CO2: 23 mEq/L (ref 19–32)
Calcium: 9.7 mg/dL (ref 8.4–10.5)
Creatinine, Ser: 0.85 mg/dL (ref 0.50–1.10)
GFR calc non Af Amer: 64 mL/min — ABNORMAL LOW (ref >90)
GFR, EST AFRICAN AMERICAN: 75 mL/min — AB (ref >90)
Glucose, Bld: 90 mg/dL (ref 70–99)
POTASSIUM: 4.3 meq/L (ref 3.7–5.3)
SODIUM: 143 meq/L (ref 137–147)

## 2014-10-14 LAB — TROPONIN I
Troponin I: <0.3 ng/mL (ref <0.30)
Troponin I: <0.3 ng/mL (ref <0.30)

## 2014-10-14 LAB — CBC
HEMATOCRIT: 24.6 % — AB (ref 36.0–46.0)
Hemoglobin: 7.6 g/dL — ABNORMAL LOW (ref 12.0–15.0)
MCH: 20.6 pg — ABNORMAL LOW (ref 26.0–34.0)
MCHC: 30.9 g/dL (ref 30.0–36.0)
MCV: 66.7 fL — AB (ref 78.0–100.0)
Platelets: 480 10*3/uL — ABNORMAL HIGH (ref 150–400)
RBC: 3.69 MIL/uL — ABNORMAL LOW (ref 3.87–5.11)
RDW: 19.8 % — AB (ref 11.5–15.5)
WBC: 7 10*3/uL (ref 4.0–10.5)

## 2014-10-14 LAB — PRO B NATRIURETIC PEPTIDE: PRO B NATRI PEPTIDE: 790.6 pg/mL — AB (ref 0–450)

## 2014-10-14 LAB — PROTIME-INR
INR: 2.39 — AB (ref 0.00–1.49)
PROTHROMBIN TIME: 26.3 s — AB (ref 11.6–15.2)

## 2014-10-14 LAB — RETICULOCYTES
RBC.: 3.72 MIL/uL — ABNORMAL LOW (ref 3.87–5.11)
RETIC COUNT ABSOLUTE: 52.1 10*3/uL (ref 19.0–186.0)
Retic Ct Pct: 1.4 % (ref 0.4–3.1)

## 2014-10-14 LAB — PREPARE RBC (CROSSMATCH)

## 2014-10-14 MED ORDER — SODIUM CHLORIDE 0.9 % IV SOLN
INTRAVENOUS | Status: DC
  Administered 2014-10-14 – 2014-10-15 (×2): via INTRAVENOUS

## 2014-10-14 MED ORDER — FERROUS SULFATE 325 (65 FE) MG PO TABS
325.0000 mg | ORAL_TABLET | Freq: Two times a day (BID) | ORAL | Status: DC
  Administered 2014-10-14 – 2014-10-16 (×4): 325 mg via ORAL
  Filled 2014-10-14 (×8): qty 1

## 2014-10-14 MED ORDER — ONDANSETRON HCL 4 MG/2ML IJ SOLN: 4.0000 mg | Freq: Four times a day (QID) | INTRAMUSCULAR | Status: DC | PRN

## 2014-10-14 MED ORDER — SODIUM CHLORIDE 0.45 % IV SOLN
INTRAVENOUS | Status: DC
  Administered 2014-10-14: 19:00:00 via INTRAVENOUS

## 2014-10-14 MED ORDER — PANTOPRAZOLE SODIUM 40 MG PO TBEC
40.0000 mg | DELAYED_RELEASE_TABLET | Freq: Every day | ORAL | Status: DC
  Administered 2014-10-14 – 2014-10-16 (×3): 40 mg via ORAL
  Filled 2014-10-14 (×3): qty 1

## 2014-10-14 MED ORDER — PANTOPRAZOLE SODIUM 40 MG PO TBEC: 40.0000 mg | DELAYED_RELEASE_TABLET | Freq: Every day | ORAL | Status: DC

## 2014-10-14 MED ORDER — ACETAMINOPHEN 650 MG RE SUPP
650.0000 mg | Freq: Four times a day (QID) | RECTAL | Status: DC | PRN
Start: 2014-10-14 — End: 2014-10-16

## 2014-10-14 MED ORDER — ALBUTEROL SULFATE (2.5 MG/3ML) 0.083% IN NEBU: 3.0000 mL | INHALATION_SOLUTION | Freq: Four times a day (QID) | RESPIRATORY_TRACT | Status: DC | PRN

## 2014-10-14 MED ORDER — SODIUM CHLORIDE 0.9 % IV SOLN
INTRAVENOUS | Status: DC
  Administered 2014-10-14: 16:00:00 via INTRAVENOUS

## 2014-10-14 MED ORDER — FUROSEMIDE 10 MG/ML IJ SOLN
40.0000 mg | Freq: Once | INTRAMUSCULAR | Status: AC
  Administered 2014-10-14: 40 mg via INTRAVENOUS
  Filled 2014-10-14: qty 4

## 2014-10-14 MED ORDER — OXYCODONE HCL 5 MG PO TABS
5.0000 mg | ORAL_TABLET | ORAL | Status: DC | PRN
  Administered 2014-10-15 (×2): 5 mg via ORAL
  Filled 2014-10-14 (×2): qty 1

## 2014-10-14 MED ORDER — ACETAMINOPHEN 325 MG PO TABS: 650.0000 mg | ORAL_TABLET | Freq: Four times a day (QID) | ORAL | Status: DC | PRN

## 2014-10-14 MED ORDER — ALBUTEROL SULFATE HFA 108 (90 BASE) MCG/ACT IN AERS
2.0000 | INHALATION_SPRAY | Freq: Four times a day (QID) | RESPIRATORY_TRACT | Status: DC | PRN
  Filled 2014-10-14: qty 6.7

## 2014-10-14 MED ORDER — MORPHINE SULFATE 2 MG/ML IJ SOLN: 2.0000 mg | INTRAMUSCULAR | Status: DC | PRN

## 2014-10-14 MED ORDER — WARFARIN SODIUM 2 MG PO TABS
4.0000 mg | ORAL_TABLET | Freq: Once | ORAL | Status: AC
  Administered 2014-10-14: 4 mg via ORAL
  Filled 2014-10-14: qty 2
  Filled 2014-10-14: qty 1

## 2014-10-14 MED ORDER — ASPIRIN 81 MG PO CHEW
81.0000 mg | CHEWABLE_TABLET | Freq: Every day | ORAL | Status: DC
  Administered 2014-10-14 – 2014-10-16 (×3): 81 mg via ORAL
  Filled 2014-10-14 (×3): qty 1

## 2014-10-14 MED ORDER — METOPROLOL TARTRATE 25 MG PO TABS
25.0000 mg | ORAL_TABLET | Freq: Two times a day (BID) | ORAL | Status: DC
  Administered 2014-10-14 – 2014-10-16 (×3): 25 mg via ORAL
  Filled 2014-10-14 (×4): qty 1

## 2014-10-14 MED ORDER — WARFARIN - PHARMACIST DOSING INPATIENT: Status: DC

## 2014-10-14 MED ORDER — GI COCKTAIL ~~LOC~~: 30.0000 mL | Freq: Four times a day (QID) | ORAL | Status: DC | PRN

## 2014-10-14 MED ORDER — ISOSORBIDE MONONITRATE ER 60 MG PO TB24
30.0000 mg | ORAL_TABLET | Freq: Two times a day (BID) | ORAL | Status: DC
  Administered 2014-10-14 – 2014-10-16 (×4): 30 mg via ORAL
  Filled 2014-10-14 (×8): qty 1

## 2014-10-14 MED ORDER — ONDANSETRON HCL 4 MG PO TABS: 4.0000 mg | ORAL_TABLET | Freq: Four times a day (QID) | ORAL | Status: DC | PRN

## 2014-10-14 MED ORDER — FUROSEMIDE 40 MG PO TABS: 40.0000 mg | ORAL_TABLET | Freq: Every day | ORAL | Status: DC | PRN

## 2014-10-14 MED ORDER — SODIUM CHLORIDE 0.9 % IV SOLN
10.0000 mL/h | Freq: Once | INTRAVENOUS | Status: AC
  Administered 2014-10-14: 10 mL/h via INTRAVENOUS

## 2014-10-14 MED ORDER — SODIUM CHLORIDE 0.9 % IJ SOLN
3.0000 mL | Freq: Two times a day (BID) | INTRAMUSCULAR | Status: DC
  Administered 2014-10-14 – 2014-10-15 (×2): 3 mL via INTRAVENOUS

## 2014-10-14 NOTE — ED Notes (Signed)
Unable to obtain IV access with mult attempts. Pt's L extremity is restricted. No viable sites on R found.

## 2014-10-14 NOTE — Progress Notes (Signed)
ANTICOAGULATION CONSULT NOTE - Initial Consult  Pharmacy Consult for Coumadin Indication: atrial fibrillation  No Known Allergies  Patient Measurements: Height: 5' (152.4 cm) Weight: 141 lb (63.957 kg) IBW/kg (Calculated) : 45.5  Vital Signs: Temp: 98.5 F (36.9 C) (11/30 1336) Temp Source: Oral (11/30 1336) BP: 138/83 mmHg (11/30 1706) Pulse Rate: 56 (11/30 1706)  Labs:  Recent Labs  10/14/14 1143  HGB 7.2*  HCT 23.4*  PLT 442*  LABPROT 26.3*  INR 2.39*  CREATININE 0.85  TROPONINI <0.30    Estimated Creatinine Clearance: 46.3 mL/min (by C-G formula based on Cr of 0.85).   Medical History: Past Medical History  Diagnosis Date  . Myocardial infarct   . Cancer     breast  . Hypertension   . Shortness of breath   . Anemia   . Headache(784.0)   . Arthritis   . Duodenal ulcer   . Small bowel arteriovenous malformation     Medications:   (Not in a hospital admission)  Assessment: 79 yo F on chronic Coumadin for Afib.  Home dose listed above.  INR therapeutic on admission.  Anemia noted.  Proceed with Coumadin as ordered by MD since no active bleeding identified.   Goal of Therapy:  INR 2-3   Plan:  Coumadin 4mg  po x1 today Daily INR  Tifani Dack, Lavonia Drafts 10/14/2014,5:31 PM

## 2014-10-14 NOTE — H&P (Signed)
[facility] History and Physical  Velmer Broadfoot [place] HQI:[id number] DOB: [birth year] DOA: 10/14/2014  Referring physician: Dr Rogene Houston - [facility] PCP: Nicholes Stairs, MD   Chief Complaint: CP  HPI: Aryelle Figg Lueck is a 79 y.o. female  CP for past 4-5 days. Associated w/ SOB, but denies palpitations, nausea, radiation. Substernal in location. Comes and goes over past several days, but today it is constant. Denies any hematemesis, melena. Pt was at her GI physicians office today and complained of CP and told to come to ED.  Alleviated by rest and worsened by activity. No change w/ movement of upper body or w/ food. Sharp in nature.   Review of Systems:  Constitutional:  No weight loss, night sweats, Fevers, chills, fatigue.  HEENT:  No headaches, Difficulty swallowing,Tooth/dental problems,Sore throat,  No sneezing, itching, ear ache, nasal congestion, post nasal drip,  Cardio-vascular:  PER HPI GI:  No heartburn, indigestion, abdominal pain, nausea, vomiting, diarrhea, change in bowel habits, loss of appetite  Resp:  Per HPI Skin:  no rash or lesions.  GU:  no dysuria, change in color of urine, no urgency or frequency. No flank pain.  Musculoskeletal:  No joint pain or swelling. No decreased range of motion. No back pain.  Psych:  No change in mood or affect. No depression or anxiety. No memory loss.   Past Medical History  Diagnosis Date  . Myocardial infarct   . Cancer     breast  . Hypertension   . Shortness of breath   . Anemia   . Headache(784.0)   . Arthritis   . Duodenal ulcer   . Small bowel arteriovenous malformation    Past Surgical History  Procedure Laterality Date  . Coronary artery bypass graft    . Eye surgery    . Breast surgery      Left mastectomy  . Right hand surgery    . Esophagogastroduodenoscopy  07/22/2011    Procedure: ESOPHAGOGASTRODUODENOSCOPY (EGD);  Surgeon: Rogene Houston, MD;  [facility];  Service: Endoscopy;   Laterality: N/A;  . Colonoscopy  07/22/2011    Procedure: COLONOSCOPY;  Surgeon: Rogene Houston, MD;  [facility];  Service: Endoscopy;  Laterality: N/A;  . Givens capsule study  07/30/2011    Procedure: GIVENS CAPSULE STUDY;  Surgeon: Rogene Houston, MD;  [facility];  Service: Endoscopy;  Laterality: N/A;  7:30  . Esophagogastroduodenoscopy N/A 04/23/2013    Procedure: ESOPHAGOGASTRODUODENOSCOPY (EGD);  Surgeon: Rogene Houston, MD;  [facility];  Service: Endoscopy;  Laterality: N/A;   Social History:  reports that she has quit smoking. She has never used smokeless tobacco. She reports that she does not drink alcohol or use illicit drugs.  No Known Allergies  Family History  Problem Relation Age of Onset  . Prostate cancer Brother   . Prostate cancer Brother   . Breast cancer Daughter     Breast Cancer that mets  . Healthy Daughter   . Healthy Son   . Healthy Son   . Healthy Son   . Healthy Son   . Healthy Son      Prior to Admission medications   Medication Sig Start Date End Date Taking? Authorizing Provider  acetaminophen (TYLENOL) 500 MG tablet Take 1,000 mg by mouth daily as needed for moderate pain.   Yes Historical Provider, MD  albuterol (PROVENTIL) (2.5 MG/3ML) 0.083% nebulizer solution Take 2.5 mg by nebulization 2 (two) times daily before  a meal.   Yes Historical Provider, MD  aspirin 81 MG tablet Take 81 mg by mouth daily.   Yes Historical Provider, MD  BREO ELLIPTA 100-25 MCG/INH AEPB Inhale 1 puff into the lungs daily.  06/21/14  Yes Historical Provider, MD  ferrous sulfate 325 (65 FE) MG tablet Take 325 mg by mouth daily with breakfast.   Yes Historical Provider, MD  furosemide (LASIX) 40 MG tablet Take 1 tablet (40 mg total) by mouth daily. Patient taking differently: Take 40 mg by mouth daily as needed.  04/24/13  Yes Lezlie Octave Black, NP  isosorbide mononitrate (IMDUR) 30 MG 24 hr tablet Take 30 mg by mouth 2 (two) times daily.   10/12/14  Yes Historical Provider, MD  metoprolol (LOPRESSOR) 50 MG tablet Take 25 mg by mouth 2 (two) times daily.    Yes Historical Provider, MD  omeprazole (PRILOSEC) 20 MG capsule Take 20 mg by mouth 2 (two) times daily before a meal. 1/2 pill atwice a day.   Yes Historical Provider, MD  PROAIR HFA 108 (90 BASE) MCG/ACT inhaler Inhale 2 puffs into the lungs every 6 (six) hours as needed.  09/03/14  Yes Historical Provider, MD  warfarin (COUMADIN) 4 MG tablet Take 4 mg by mouth daily. 4 mg on Monday's and 7.5 mg on all other days.   Yes Historical Provider, MD  warfarin (COUMADIN) 7.5 MG tablet Take 1 tablet by mouth daily. 7.5 mg daily except on Monday's, take 4 mg. 07/23/14  Yes Historical Provider, MD   Physical Exam: Filed Vitals:   10/14/14 1130 10/14/14 1336 10/14/14 1400 10/14/14 1430  BP: 187/61 181/73 170/74 156/63  Pulse: 58 63 52   Temp:  98.5 F (36.9 C)    TempSrc:  Oral    Resp: 17 18 19 16   Height:      Weight:      SpO2: 100% 95% 96%     Wt Readings from Last 3 Encounters:  10/14/14 63.957 kg (141 lb)  10/14/14 64.139 kg (141 lb 6.4 oz)  07/09/14 64.592 kg (142 lb 6.4 oz)    General:  Appears calm and comfortable Eyes:  PERRL, normal lids, irises & conjunctiva ENT: Dry mucus membranes Neck:  no LAD, masses or thyromegaly Cardiovascular:  RRR, III/VI systolic murmur. No LE edema. Telemetry: Afib Respiratory:  CTA bilaterally, no w/r/r. Normal respiratory effort. Abdomen:  soft, ntnd Skin:  no rash or induration seen on limited exam Musculoskeletal:  grossly normal tone BUE/BLE Psychiatric:  grossly normal mood and affect, speech fluent and appropriate Neurologic:  grossly non-focal.          Labs on Admission:  Basic Metabolic Panel:  Recent Labs Lab 10/14/14 1143  NA 143  K 4.3  CL 106  CO2 23  GLUCOSE 90  BUN 13  CREATININE 0.85  CALCIUM 9.7   Liver Function Tests: No results for input(s): AST, ALT, ALKPHOS, BILITOT, PROT, ALBUMIN in the  last 168 hours. No results for input(s): LIPASE, AMYLASE in the last 168 hours. No results for input(s): AMMONIA in the last 168 hours. CBC:  Recent Labs Lab 10/14/14 1143  WBC 6.8  NEUTROABS 4.3  HGB 7.2*  HCT 23.4*  MCV 66.7*  PLT 442*   Cardiac Enzymes:  Recent Labs Lab 10/14/14 1143  TROPONINI <0.30    BNP (last 3 results)  Recent Labs  10/14/14 1143  PROBNP 790.6*   CBG: No results for input(s): GLUCAP in the last 168 hours.  Radiological Exams  on Admission: Dg Chest Port 1 View  10/14/2014   CLINICAL DATA:  Chest pain  EXAM: PORTABLE CHEST - 1 VIEW  COMPARISON:  04/23/2013  FINDINGS: Cardiac enlargement. Pulmonary vascular congestion with mild edema. Negative for pleural effusion. Mild atelectasis in the lung base  IMPRESSION: Congestive heart failure with mild interstitial edema.   Electronically Signed   By: Franchot Gallo M.D.   On: 10/14/2014 13:42    EKG: Independently reviewed. Afib  Assessment/Plan Principal Problem:   Anemia Active Problems:   CAD (coronary artery disease)   HTN (hypertension)   Chest pain   Atrial fibrillation   GERD (gastroesophageal reflux disease)   Hyperlipidemia   Small bowel arteriovenous malformation   Anemia: Hgb 7.2, baseline ~11. Microcytic. Concern for intermittent GI loss despite negative hemocult card, vs severe iron deficiency. Pt has had similar episodes in the past and is noted to have small bowel AVMs and h/o healed duodenal ulcer. Pts VS are stable considering the significant change in Hgb and likely dehydration. 2 Units PRBC ordered by ED.  - Admit to Tele - Confirmatory Hgb - f/u Hgb post transfusion - Anemia panel - CBC in am - Change iron to BID. Consider Infusion if labs confirm significant depletion   Chest pain: Likely related to anemia vs GI vs MSK vs pulmonary. CXR w/ mild pulmonary congestion w/o PNA. Troponin Neg. BNP 790, last Echo EF 55-60% w/o mention of diastolic dysfunction. EKG w/o signs  of ACS.  - Cycle troponins - EKG in am - GI cocktail.  HTN: elevated on admission likely from pain.  - Continue home Imdur, Metop  Afib: chronic. No RVR, INR 2.39 on admission - continue coumadin - continue metoprolol  GERD:  - continue home PPI  Code Status: DNI DVT Prophylaxis: Warfarin for Afib Family Communication: Son??? In room for admission Disposition Plan: pending transfusion and improvement (indicate anticipated LOS)  Time spent: >70 min  Orson Rho, [facility] [email or web address] Password [email or web address]

## 2014-10-14 NOTE — Progress Notes (Signed)
Lab called and reported that the pt's blood had antibodies that required the blood to come from [facility]. Lab will notify nurse when the blood arrives to [facility] so blood can begin transfusing.

## 2014-10-14 NOTE — ED Provider Notes (Signed)
CSN: [id number]     Arrival date & time 10/14/14  1107 History   First MD Initiated Contact with Patient 10/14/14 1313     Chief Complaint  Patient presents with  . Chest Pain     (Consider location/radiation/quality/duration/timing/severity/associated sxs/prior Treatment) Patient is a 79 y.o. female presenting with chest pain. The history is provided by the patient and a relative.  Chest Pain Associated symptoms: shortness of breath   Associated symptoms: no abdominal pain, no back pain, no fever, no headache, no nausea and not vomiting    Patient with a feeling of shortness of breath and chest pain substernal area for about a week. Patient has baseline shortness of breath but has been worse for the past 4-5 days. Patient was at Dr. Rosina Lowenstein office. And sent over here for further evaluation of the chest pain. Chest pain is stated was substernal comes and goes. At present the for several hours at a time. Patient states that the pain was present as 6 out of 10. Patient denies any abdominal pain nausea or vomiting or any blood in her bowel movements or black color to her bowel movements. Patient has a history of atrial fibrillation and is on Coumadin. Patient denies a history of congestive heart fair however is on Lasix.  Past Medical History  Diagnosis Date  . Myocardial infarct   . Cancer     breast  . Hypertension   . Shortness of breath   . Anemia   . Headache(784.0)   . Arthritis    Past Surgical History  Procedure Laterality Date  . Coronary artery bypass graft    . Eye surgery    . Breast surgery      Left mastectomy  . Right hand surgery    . Esophagogastroduodenoscopy  07/22/2011    Procedure: ESOPHAGOGASTRODUODENOSCOPY (EGD);  Surgeon: Rogene Houston, MD;  [facility];  Service: Endoscopy;  Laterality: N/A;  . Colonoscopy  07/22/2011    Procedure: COLONOSCOPY;  Surgeon: Rogene Houston, MD;  [facility];  Service: Endoscopy;  Laterality: N/A;  .  Givens capsule study  07/30/2011    Procedure: GIVENS CAPSULE STUDY;  Surgeon: Rogene Houston, MD;  [facility];  Service: Endoscopy;  Laterality: N/A;  7:30  . Esophagogastroduodenoscopy N/A 04/23/2013    Procedure: ESOPHAGOGASTRODUODENOSCOPY (EGD);  Surgeon: Rogene Houston, MD;  [facility];  Service: Endoscopy;  Laterality: N/A;   Family History  Problem Relation Age of Onset  . Prostate cancer Brother   . Prostate cancer Brother   . Breast cancer Daughter     Breast Cancer that mets  . Healthy Daughter   . Healthy Son   . Healthy Son   . Healthy Son   . Healthy Son   . Healthy Son    History  Substance Use Topics  . Smoking status: Former Smoker -- 1.00 packs/day for 30 years  . Smokeless tobacco: Never Used     Comment: Patient quit smoking 25 years ago  . Alcohol Use: No     Comment: none for 8 months   OB History    No data available     Review of Systems  Constitutional: Negative for fever.  HENT: Positive for congestion.   Eyes: Negative for visual disturbance.  Respiratory: Positive for shortness of breath.   Cardiovascular: Positive for chest pain.  Gastrointestinal: Negative for nausea, vomiting, abdominal pain, diarrhea and blood in stool.  Genitourinary: Negative for  dysuria.  Musculoskeletal: Negative for back pain.  Skin: Negative for rash.  Neurological: Negative for headaches.  Hematological: Bruises/bleeds easily.  Psychiatric/Behavioral: Negative for confusion.      Allergies  Review of patient's allergies indicates no known allergies.  Home Medications   Prior to Admission medications   Medication Sig Start Date End Date Taking? Authorizing Provider  acetaminophen (TYLENOL) 500 MG tablet Take 1,000 mg by mouth daily as needed for moderate pain.   Yes Historical Provider, MD  albuterol (PROVENTIL) (2.5 MG/3ML) 0.083% nebulizer solution Take 2.5 mg by nebulization 2 (two) times daily before a meal.   Yes Historical  Provider, MD  aspirin 81 MG tablet Take 81 mg by mouth daily.   Yes Historical Provider, MD  BREO ELLIPTA 100-25 MCG/INH AEPB Inhale 1 puff into the lungs daily.  06/21/14  Yes Historical Provider, MD  ferrous sulfate 325 (65 FE) MG tablet Take 325 mg by mouth daily with breakfast.   Yes Historical Provider, MD  furosemide (LASIX) 40 MG tablet Take 1 tablet (40 mg total) by mouth daily. Patient taking differently: Take 40 mg by mouth daily as needed.  04/24/13  Yes Lezlie Octave Black, NP  isosorbide mononitrate (IMDUR) 30 MG 24 hr tablet Take 30 mg by mouth 2 (two) times daily.  10/12/14  Yes Historical Provider, MD  metoprolol (LOPRESSOR) 50 MG tablet Take 25 mg by mouth 2 (two) times daily.    Yes Historical Provider, MD  omeprazole (PRILOSEC) 20 MG capsule Take 20 mg by mouth 2 (two) times daily before a meal. 1/2 pill atwice a day.   Yes Historical Provider, MD  PROAIR HFA 108 (90 BASE) MCG/ACT inhaler Inhale 2 puffs into the lungs every 6 (six) hours as needed.  09/03/14  Yes Historical Provider, MD  warfarin (COUMADIN) 4 MG tablet Take 4 mg by mouth daily. 4 mg on Monday's and 7.5 mg on all other days.   Yes Historical Provider, MD  warfarin (COUMADIN) 7.5 MG tablet Take 1 tablet by mouth daily. 7.5 mg daily except on Monday's, take 4 mg. 07/23/14  Yes Historical Provider, MD   BP 156/63 mmHg  Pulse 52  Temp(Src) 98.5 F (36.9 C) (Oral)  Resp 16  Ht 5' (1.524 m)  Wt 141 lb (63.957 kg)  BMI 27.54 kg/m2  SpO2 96% Physical Exam  Constitutional: She is oriented to person, place, and time. She appears well-developed and well-nourished. No distress.  HENT:  Head: Normocephalic and atraumatic.  Eyes: Conjunctivae are normal. Pupils are equal, round, and reactive to light.  Neck: Normal range of motion. Neck supple.  Cardiovascular: Normal heart sounds.   No murmur heard. Bradycardic irregular heart rate  Pulmonary/Chest: Effort normal and breath sounds normal. No respiratory distress. She has no  rales.  Abdominal: Soft. Bowel sounds are normal. There is no tenderness.  Genitourinary: Guaiac negative stool.  Rectal exam without evidence of a prolapsed internal hemorrhoids or external hemorrhoids. There was some skin tags. Stool was brown and heme-negative. No masses.  Musculoskeletal: Normal range of motion.  Neurological: She is alert and oriented to person, place, and time. No cranial nerve deficit. She exhibits normal muscle tone. Coordination normal.  Skin: Skin is warm. No rash noted. There is pallor.    ED Course  Procedures (including critical care time) Labs Review Labs Reviewed  CBC WITH DIFFERENTIAL - Abnormal; Notable for the following:    RBC 3.51 (*)    Hemoglobin 7.2 (*)    HCT 23.4 (*)  MCV 66.7 (*)    MCH 20.5 (*)    RDW 19.8 (*)    Platelets 442 (*)    Monocytes Relative 13 (*)    All other components within normal limits  BASIC METABOLIC PANEL - Abnormal; Notable for the following:    GFR calc non Af Amer 64 (*)    GFR calc Af Amer 75 (*)    All other components within normal limits  PROTIME-INR - Abnormal; Notable for the following:    Prothrombin Time 26.3 (*)    INR 2.39 (*)    All other components within normal limits  PRO B NATRIURETIC PEPTIDE - Abnormal; Notable for the following:    Pro B Natriuretic peptide (BNP) 790.6 (*)    All other components within normal limits  TROPONIN I  TYPE AND SCREEN  PREPARE RBC (CROSSMATCH)   Results for orders placed or performed during the hospital encounter of 10/14/14  CBC with Differential  Result Value Ref Range   WBC 6.8 4.0 - 10.5 K/uL   RBC 3.51 (L) 3.87 - 5.11 MIL/uL   Hemoglobin 7.2 (L) 12.0 - 15.0 g/dL   HCT 23.4 (L) 36.0 - 46.0 %   MCV 66.7 (L) 78.0 - 100.0 fL   MCH 20.5 (L) 26.0 - 34.0 pg   MCHC 30.8 30.0 - 36.0 g/dL   RDW 19.8 (H) 11.5 - 15.5 %   Platelets 442 (H) 150 - 400 K/uL   Neutrophils Relative % 63 43 - 77 %   Neutro Abs 4.3 1.7 - 7.7 K/uL   Lymphocytes Relative 22 12 - 46 %    Lymphs Abs 1.5 0.7 - 4.0 K/uL   Monocytes Relative 13 (H) 3 - 12 %   Monocytes Absolute 0.9 0.1 - 1.0 K/uL   Eosinophils Relative 1 0 - 5 %   Eosinophils Absolute 0.1 0.0 - 0.7 K/uL   Basophils Relative 1 0 - 1 %   Basophils Absolute 0.1 0.0 - 0.1 K/uL  Basic metabolic panel  Result Value Ref Range   Sodium 143 137 - 147 mEq/L   Potassium 4.3 3.7 - 5.3 mEq/L   Chloride 106 96 - 112 mEq/L   CO2 23 19 - 32 mEq/L   Glucose, Bld 90 70 - 99 mg/dL   BUN 13 6 - 23 mg/dL   Creatinine, Ser 0.85 0.50 - 1.10 mg/dL   Calcium 9.7 8.4 - 10.5 mg/dL   GFR calc non Af Amer 64 (L) >90 mL/min   GFR calc Af Amer 75 (L) >90 mL/min   Anion gap 14 5 - 15  Protime-INR  Result Value Ref Range   Prothrombin Time 26.3 (H) 11.6 - 15.2 seconds   INR 2.39 (H) 0.00 - 1.49  Troponin I  Result Value Ref Range   Troponin I <0.30 <0.30 ng/mL  Pro b natriuretic peptide (BNP)  Result Value Ref Range   Pro B Natriuretic peptide (BNP) 790.6 (H) 0 - 450 pg/mL  Type and screen  Result Value Ref Range   ABO/RH(D) O POS    Antibody Screen POS    Sample Expiration 10/17/2014   Prepare RBC  Result Value Ref Range   Order Confirmation ORDER PROCESSED BY BLOOD BANK      Imaging Review Dg Chest Port 1 View  10/14/2014   CLINICAL DATA:  Chest pain  EXAM: PORTABLE CHEST - 1 VIEW  COMPARISON:  04/23/2013  FINDINGS: Cardiac enlargement. Pulmonary vascular congestion with mild edema. Negative for pleural effusion. Mild  atelectasis in the lung base  IMPRESSION: Congestive heart failure with mild interstitial edema.   Electronically Signed   By: Franchot Gallo M.D.   On: 10/14/2014 13:42     EKG Interpretation   Date/Time:  Monday October 14 2014 11:24:20 EST Ventricular Rate:  50 PR Interval:    QRS Duration: 126 QT Interval:  466 QTC Calculation: 425 R Axis:   -6 Text Interpretation:  Atrial fibrillation LVH with secondary  repolarization abnormality Confirmed by Dontea Corlew  MD, Rithvik Orcutt [phone]) on  10/14/2014  1:20:38 PM      CRITICAL CARE Performed by: Fredia Sorrow Total critical care time: 30 Critical care time was exclusive of separately billable procedures and treating other patients. Critical care was necessary to treat or prevent imminent or life-threatening deterioration. Critical care was time spent personally by me on the following activities: development of treatment plan with patient and/or surrogate as well as nursing, discussions with consultants, evaluation of patient's response to treatment, examination of patient, obtaining history from patient or surrogate, ordering and performing treatments and interventions, ordering and review of laboratory studies, ordering and review of radiographic studies, pulse oximetry and re-evaluation of patient's condition.    MDM   Final diagnoses:  Chest pain  SOB (shortness of breath)  Anemia due to other cause  Congestive heart failure, unspecified congestive heart failure chronicity, unspecified congestive heart failure type    Patient presents today with a significant anemia. Hemoglobin below 8. Substernal chest pain and shortness of breath for the past 7 days. Cardiac workup negative. Patient has a history of atrial fibrillation is on Coumadin for that. Her INR is therapeutic. Suspect that the shortness of breath in the chest pain is related to the anemia. Patient's had admissions for that in the past. Patient denies a history of congestive heart failure. Chest x-ray is suggestive of that. We'll give a little bit of Lasix. Blood is been ordered 2 units to be transfused slowly. Patient will require admission. Rectal exam is heme-negative so no evidence of GI blood loss at this time.  In addition no evidence of any acute cardiac event. Troponin was negative despite chest pain for the past 4-5 days.    Fredia Sorrow, MD 10/14/14 1540

## 2014-10-14 NOTE — Progress Notes (Signed)
Presenting complaint;  Follow-up for chronic GI bleed and  iron deficiency anemia.  Subjective:  Patient is 79 year old [place] female who has history of GI bleed and iron deficiency anemia and is here for scheduled visit. She was last seen 3 months ago and her hemoglobin was 12.9 g. She states she had dark stools about 10 days ago lasting for 2 days. He denies rectal bleeding nausea or vomiting. She has intermittent burping but no heartburn or dysphagia. Patient complains of retrosternal heaviness and pressure. Patient states she has had intermittent chest pain for 1 week. She has chronic dyspnea. She is not sure if breathing difficulty has gotten worse. Her appetite is fair. Her weight is down by 1 pound since her last visit.    Current Medications: Outpatient Encounter Prescriptions as of 10/14/2014  Medication Sig  . albuterol (PROVENTIL) (2.5 MG/3ML) 0.083% nebulizer solution Take 2.5 mg by nebulization 2 (two) times daily before a meal.  . aspirin 81 MG tablet Take 81 mg by mouth daily.  Marland Kitchen BREO ELLIPTA 100-25 MCG/INH AEPB Patient states that she uses as directed  . ferrous sulfate 325 (65 FE) MG tablet Take 325 mg by mouth daily with breakfast.  . furosemide (LASIX) 40 MG tablet Take 1 tablet (40 mg total) by mouth daily.  . isosorbide mononitrate (IMDUR) 30 MG 24 hr tablet Take 30 mg by mouth 2 (two) times daily.   . metoprolol (LOPRESSOR) 50 MG tablet Take 25 mg by mouth 2 (two) times daily.   Marland Kitchen omeprazole (PRILOSEC) 20 MG capsule Take 20 mg by mouth 2 (two) times daily before a meal. 1/2 pill atwice a day.  Marland Kitchen PROAIR HFA 108 (90 BASE) MCG/ACT inhaler Inhale 2 puffs into the lungs every 6 (six) hours as needed.   . warfarin (COUMADIN) 4 MG tablet Take 4-7.5 mg by mouth daily. 7.5 by mouth everyday but one and she takes 4 mg by mouth on this day  . [DISCONTINUED] hydrALAZINE (APRESOLINE) 25 MG tablet Take 1 tablet (25 mg total) by mouth every 8 (eight) hours. (Patient not taking:  Reported on 10/14/2014)  . [DISCONTINUED] pravastatin (PRAVACHOL) 40 MG tablet Take 80 mg by mouth at bedtime.      Objective: Blood pressure 118/72, pulse 66, temperature 97.3 F (36.3 C), temperature source Oral, resp. rate 18, height 5' (1.524 m), weight 141 lb 6.4 oz (64.139 kg). Patient is alert and does not appear to be in any distress Conjunctiva is pale. Sclera is nonicteric Oropharyngeal mucosa is normal. No neck masses or thyromegaly noted. Cardiac exam with irregular rhythm normal S1 and S2. Faint systolic ejection murmur best heard at left upper sternal border. Lungs are clear to auscultation. Abdomen is symmetrical. It is soft with mild midepigastric tenderness. No organomegaly or masses. She has trace edema around her ankles but no clubbing.  Labs/studies Results: CBC on 07/05/2014  CBC 8.6, H&H 12.9 and 41.3 and platelet count 409K     Assessment:  #1. History of GI bleed secondary to small bowel AV malformation. She also has history of duodenal ulcer which was subsequently documented to have healed. H&H on 07/05/2014 was 12.9 and 41.3. #2. Iron deficiency anemia secondary to above. No evidence of overt GI bleed. Patient remains on low-dose aspirin and warfarin. #3. Intermittent chest pain of one week's duration and now she is with retrosternal pressure and heaviness. She is hemodynamically stable. Given her cardiac history she needs to be evaluated in ER. Marland Kitchen   Plan:  Patient referred  to ER at [facility] for further evaluation of her chest pain. Dr. Dewayne Hatch was contacted about Ms. Timson.

## 2014-10-14 NOTE — ED Notes (Signed)
Report given to [place], Therapist, sports. Pt being assisted to bathroom and will be ready for transport.

## 2014-10-14 NOTE — ED Notes (Signed)
Chest pain x 7-10 days. Denies cough. Pt states constant pain, but pain sometimes moves to left shoulder and below left breast. NAD at this time.

## 2014-10-14 NOTE — Patient Instructions (Signed)
Patient to be taken to emergency room for evaluation of chest pain.

## 2014-10-15 DIAGNOSIS — E876 Hypokalemia: Secondary | ICD-10-CM

## 2014-10-15 DIAGNOSIS — I4891 Unspecified atrial fibrillation: Secondary | ICD-10-CM

## 2014-10-15 DIAGNOSIS — R079 Chest pain, unspecified: Secondary | ICD-10-CM

## 2014-10-15 DIAGNOSIS — R0602 Shortness of breath: Secondary | ICD-10-CM

## 2014-10-15 DIAGNOSIS — I509 Heart failure, unspecified: Secondary | ICD-10-CM

## 2014-10-15 DIAGNOSIS — R0789 Other chest pain: Secondary | ICD-10-CM

## 2014-10-15 DIAGNOSIS — D6489 Other specified anemias: Secondary | ICD-10-CM

## 2014-10-15 LAB — COMPREHENSIVE METABOLIC PANEL
ALT: 8 U/L (ref 0–35)
AST: 10 U/L (ref 0–37)
Albumin: 3.1 g/dL — ABNORMAL LOW (ref 3.5–5.2)
Alkaline Phosphatase: 76 U/L (ref 39–117)
Anion gap: 13 (ref 5–15)
BUN: 12 mg/dL (ref 6–23)
CALCIUM: 9.1 mg/dL (ref 8.4–10.5)
CO2: 22 mEq/L (ref 19–32)
Chloride: 104 mEq/L (ref 96–112)
Creatinine, Ser: 0.78 mg/dL (ref 0.50–1.10)
GFR calc Af Amer: >90 mL/min (ref >90)
GFR calc non Af Amer: 79 mL/min — ABNORMAL LOW (ref >90)
GLUCOSE: 84 mg/dL (ref 70–99)
POTASSIUM: 3.4 meq/L — AB (ref 3.7–5.3)
SODIUM: 139 meq/L (ref 137–147)
TOTAL PROTEIN: 6.2 g/dL (ref 6.0–8.3)
Total Bilirubin: 1 mg/dL (ref 0.3–1.2)

## 2014-10-15 LAB — CBC
HCT: 25.4 % — ABNORMAL LOW (ref 36.0–46.0)
HEMOGLOBIN: 8.1 g/dL — AB (ref 12.0–15.0)
MCH: 21.9 pg — ABNORMAL LOW (ref 26.0–34.0)
MCHC: 31.9 g/dL (ref 30.0–36.0)
MCV: 68.6 fL — ABNORMAL LOW (ref 78.0–100.0)
Platelets: 414 10*3/uL — ABNORMAL HIGH (ref 150–400)
RBC: 3.7 MIL/uL — AB (ref 3.87–5.11)
RDW: 21.2 % — ABNORMAL HIGH (ref 11.5–15.5)
WBC: 6.5 10*3/uL (ref 4.0–10.5)

## 2014-10-15 LAB — VITAMIN B12: Vitamin B-12: 310 pg/mL (ref 211–911)

## 2014-10-15 LAB — TROPONIN I

## 2014-10-15 LAB — IRON AND TIBC
Iron: 168 ug/dL — ABNORMAL HIGH (ref 42–135)
Saturation Ratios: 44 % (ref 20–55)
TIBC: 384 ug/dL (ref 250–470)
UIBC: 216 ug/dL (ref 125–400)

## 2014-10-15 LAB — PROTIME-INR
INR: 2.41 — AB (ref 0.00–1.49)
PROTHROMBIN TIME: 26.4 s — AB (ref 11.6–15.2)

## 2014-10-15 LAB — FERRITIN: Ferritin: 10 ng/mL (ref 10–291)

## 2014-10-15 LAB — FOLATE: FOLATE: 11 ng/mL

## 2014-10-15 MED ORDER — MORPHINE SULFATE 2 MG/ML IJ SOLN: 1.0000 mg | INTRAMUSCULAR | Status: DC | PRN

## 2014-10-15 MED ORDER — OXYCODONE HCL 5 MG PO TABS: 5.0000 mg | ORAL_TABLET | Freq: Four times a day (QID) | ORAL | Status: DC | PRN

## 2014-10-15 MED ORDER — POTASSIUM CHLORIDE CRYS ER 20 MEQ PO TBCR
40.0000 meq | EXTENDED_RELEASE_TABLET | Freq: Once | ORAL | Status: AC
  Administered 2014-10-15: 40 meq via ORAL
  Filled 2014-10-15: qty 2

## 2014-10-15 MED ORDER — WARFARIN SODIUM 5 MG PO TABS: 7.5000 mg | ORAL_TABLET | Freq: Once | ORAL | Status: DC

## 2014-10-15 MED ORDER — INFLUENZA VAC SPLIT QUAD 0.5 ML IM SUSY
0.5000 mL | PREFILLED_SYRINGE | INTRAMUSCULAR | Status: AC
  Administered 2014-10-16: 0.5 mL via INTRAMUSCULAR
  Filled 2014-10-15: qty 0.5

## 2014-10-15 MED ORDER — SODIUM CHLORIDE 0.9 % IV SOLN
Freq: Once | INTRAVENOUS | Status: AC
  Administered 2014-10-15: 13:00:00 via INTRAVENOUS

## 2014-10-15 NOTE — Care Management Note (Addendum)
    Page 1 of 1   10/16/2014     10:23:57 AM CARE MANAGEMENT NOTE 10/16/2014  Patient:  Yesenia Woods,Yesenia Woods   Account Number:  [id number]  Date Initiated:  10/15/2014  Documentation initiated by:  CHILDRESS,JESSICA  Subjective/Objective Assessment:   Pt is from home with self care. Pt lives with daughter. Pt has home O2 through [place]. Pt has no medication needs.     Action/Plan:   Pt plans to dishcarge home with self care. No CM needs identified at this time.   Anticipated DC Date:  10/17/2014   Anticipated DC Plan:  [place]  CM consult      PAC Choice  DURABLE MEDICAL EQUIPMENT   Choice offered to / List presented to:  [name]   DME arranged  [facility]      DME agency  [facility].        Status of service:  Completed, signed off Medicare Important Message given?   (If response is "NO", the following Medicare IM given date fields will be blank) Date Medicare IM given:   Medicare IM given by:   Date Additional Medicare IM given:   Additional Medicare IM given by:    Discharge Disposition:  HOME/SELF CARE  Per UR Regulation:    If discussed at Long Length of Stay Meetings, dates discussed:    Comments:  10/16/2014 [place], RN, MSN, PCCN Pt plans to discharge home today. Pt has been ordered [name] and [name] of [facility], per pt's choice, was notified of order and will deliver cane to pt's room prior ot discharge. No further CM needs at this time.  10/15/2014 [place], RN, MSN, [employer]

## 2014-10-15 NOTE — Progress Notes (Signed)
Pt's blood transfusion completed. No signs of distress or reaction. VSS. Will continue to monitor

## 2014-10-15 NOTE — Progress Notes (Addendum)
ANTICOAGULATION CONSULT NOTE - follow up  Pharmacy Consult for Coumadin Indication: atrial fibrillation  No Known Allergies  Patient Measurements: Height: 5' (152.4 cm) Weight: 141 lb (63.957 kg) IBW/kg (Calculated) : 45.5  Vital Signs: Temp: 98.1 F (36.7 C) (12/01 0621) Temp Source: Oral (12/01 0621) BP: 149/66 mmHg (12/01 0621) Pulse Rate: 52 (12/01 0621)  Labs:  Recent Labs  10/14/14 1143 10/14/14 1921 10/14/14 2310 10/15/14 0453  HGB 7.2* 7.6*  --  8.1*  HCT 23.4* 24.6*  --  25.4*  PLT 442* 480*  --  414*  LABPROT 26.3*  --   --  26.4*  INR 2.39*  --   --  2.41*  CREATININE 0.85  --   --  0.78  TROPONINI <0.30 <0.30 <0.30  --    Estimated Creatinine Clearance: 49.2 mL/min (by C-G formula based on Cr of 0.78).  Medical History: Past Medical History  Diagnosis Date  . Myocardial infarct   . Cancer     breast  . Hypertension   . Shortness of breath   . Anemia   . Headache(784.0)   . Arthritis   . Duodenal ulcer   . Small bowel arteriovenous malformation    Medications:  Prescriptions prior to admission  Medication Sig Dispense Refill Last Dose  . acetaminophen (TYLENOL) 500 MG tablet Take 1,000 mg by mouth daily as needed for moderate pain.   Past Week at Unknown time  . aspirin 81 MG tablet Take 81 mg by mouth daily.   10/13/2014 at Unknown time  . BREO ELLIPTA 100-25 MCG/INH AEPB Inhale 1 puff into the lungs daily.    10/14/2014 at Unknown time  . ferrous sulfate 325 (65 FE) MG tablet Take 325 mg by mouth daily with breakfast.   10/13/2014 at Unknown time  . furosemide (LASIX) 40 MG tablet Take 1 tablet (40 mg total) by mouth daily. (Patient taking differently: Take 40 mg by mouth daily as needed. ) 30 tablet 0 over a month at Unknown time  . isosorbide mononitrate (IMDUR) 30 MG 24 hr tablet Take 30 mg by mouth 2 (two) times daily.    10/13/2014 at Unknown time  . metoprolol (LOPRESSOR) 50 MG tablet Take 25 mg by mouth 2 (two) times daily.    10/13/2014  at 1530  . omeprazole (PRILOSEC) 20 MG capsule Take 20 mg by mouth 2 (two) times daily before a meal. 1/2 pill atwice a day.   10/13/2014 at Unknown time  . PROAIR HFA 108 (90 BASE) MCG/ACT inhaler Inhale 2 puffs into the lungs every 6 (six) hours as needed.    10/13/2014 at Unknown time  . warfarin (COUMADIN) 4 MG tablet Take 4 mg by mouth daily. 4 mg on Monday's and 7.5 mg on all other days.   10/13/2014 at Unknown time  . warfarin (COUMADIN) 7.5 MG tablet Take 1 tablet by mouth daily. 7.5 mg daily except on Monday's, take 4 mg.   10/13/2014 at Unknown time   Assessment: 79 yo F on chronic Coumadin for Afib.  Home dose listed above.  INR therapeutic.  Anemia noted but H/H has improved.  Proceed with Coumadin as ordered by MD since no active bleeding identified.   Per attending:  Assessment/Plan: Anemia: microcytic. Hgb 8.1 s/p 1 unit PRBC. Baseline ~11. With Hx GI bleed related to AV malformation and duodenal ulcer and IDA. Concern for intermittent GI loss. Anemia panel pending. Will hold asa and coumadin. She remains hemodynamically stable. No s/sx overt bleeding. Transfuse  1 unit PRBC for total 2 units since admission. Monitor. Request GI consult.   Plan:   HOLD coumadin for now  F/U plan  Hart Robinsons A 10/15/2014,10:24 AM

## 2014-10-15 NOTE — Plan of Care (Signed)
Problem: Phase I Progression Outcomes Goal: OOB as tolerated unless otherwise ordered Outcome: Completed/Met Date Met:  10/15/14 Goal: Voiding-avoid urinary catheter unless indicated Outcome: Completed/Met Date Met:  10/15/14     

## 2014-10-15 NOTE — Progress Notes (Signed)
Patient is 79 year old African-American female who has history of recurrent GI bleed in the setting of chronic anticoagulation and low-dose aspirin use. She has been documented have peptic ulcer disease and small mild angiodysplasia. Patient was seen in office yesterday for scheduled visit and noted to have pale conjunctiva. She was complaining of chest pain. She denies melena or rectal bleeding. She is on by mouth iron which she takes no more than 3 times a week. Patient was sent over to emergency room because of chest pain. Her hemoglobin was noted to be 7.2 g. It was 12.9 g on 07/05/2014. Patient was noted to have heme-negative stool on admission. She was admitted for further evaluation. Troponin levels 3 are negative. She has received 2 units of PRBCs. This evening she is free of chest pain. She reports that she had been feeling cold for 2 weeks which she forgot to mention in the office. She states she's had this symptom when her hemoglobin drops. She is hoping to be able to go home tomorrow. Serum B12 and folate levels are normal. Serum iron 168 TIBC 384 and saturation 44% Serum ferritin remains low at 10.  Assessment; #1. Anemia most likely secondary to intermittent GI bleed which has been well documented in the past. Stool is guaiac negative. Therefore she is not actively bleeding. Serum ferritin is low serum iron and saturation are high which is possibly not accurate. Since she is not actively bleeding do not feel she needs to undergo endoscopic evaluation. #2. Chest pain has resolved. She possibly was having angina in the setting of profound anemia. She has history of coronary artery disease and is on low-dose aspirin. She is scheduled to see Dr. Stanford Breed next month. #3. Atrial fibrillation. Patient is chronically anticoagulated.  Recommendations; H&H in a.m. consider another unit of PRBCs if hemoglobin less than 9 g. Consider stopping aspirin ordered using dose to 40 mg daily if  feasible. We will do another Hemoccult.

## 2014-10-15 NOTE — Progress Notes (Signed)
[facility] PROGRESS NOTE  Yesenia Woods HQI:[id number] DOB: 07/21/1935 DOA: 10/14/2014 PCP: Nicholes Stairs, MD  Assessment/Plan: Anemia: microcytic. Hgb 8.1 s/p 1 unit PRBC. Baseline ~11.  With Hx GI bleed related to AV malformation and duodenal ulcer and IDA. She is on coumadin and asa and iron.  Concern for intermittent GI loss. Anemia panel pending.  Will hold asa and coumadin. She remains hemodynamically stable. No s/sx overt bleeding. Transfuse 1 unit PRBC for total 2 units since admission. Monitor. Request GI consult.     - CChest pain: improved this am.  Likely related to anemia vs GI vs MSK vs pulmonary. CXR w/ mild pulmonary congestion w/o PNA. Troponin Neg x3. BNP 790, last Echo EF 55-60% w/o mention of diastolic dysfunction. EKG w/o signs of ACS. No events on tele. Substernal pain reproducable. Continue home lasix, imdur and BB.  Continue to monitor  HTN: improved control. Continue home Imdur, [place]. monitor  Afib: Rate control. INR 2.39 on admission. Will hold coumadin. Continue BB.  Hypokalemia: mild . Will replete and recheck  GERD:  1. - continue home PPI  Code Status: DNI Family Communication: none present Disposition Plan: home when ready   Consultants:  GI  Procedures:  none  Antibiotics:  none  HPI/Subjective: Awake alert reports feeling "sleepy" since pain medicine. Complains pain left knee. Denies chest pain  Objective: Filed Vitals:   10/15/14 0621  BP: 149/66  Pulse: 52  Temp: 98.1 F (36.7 C)  Resp: 20    Intake/Output Summary (Last 24 hours) at 10/15/14 1110 Last data filed at 10/15/14 0400  Gross per 24 hour  Intake 1052.25 ml  Output      0 ml  Net 1052.25 ml   Filed Weights   10/14/14 1123  Weight: 63.957 kg (141 lb)    Exam:   General:  Well nourished appears comfortable  Cardiovascular: irregularly irregular. +murmur No Le edema  Respiratory: normal effort BS distant but clear no wheeze or  crackle  Abdomen: soft non-distended nontender to palpation  Musculoskeletal: joints without swelling/erythema   Data Reviewed: Basic Metabolic Panel:  Recent Labs Lab 10/14/14 1143 10/15/14 0453  NA 143 139  K 4.3 3.4*  CL 106 104  CO2 23 22  GLUCOSE 90 84  BUN 13 12  CREATININE 0.85 0.78  CALCIUM 9.7 9.1   Liver Function Tests:  Recent Labs Lab 10/15/14 0453  AST 10  ALT 8  ALKPHOS 76  BILITOT 1.0  PROT 6.2  ALBUMIN 3.1*   No results for input(s): LIPASE, AMYLASE in the last 168 hours. No results for input(s): AMMONIA in the last 168 hours. CBC:  Recent Labs Lab 10/14/14 1143 10/14/14 1921 10/15/14 0453  WBC 6.8 7.0 6.5  NEUTROABS 4.3  --   --   HGB 7.2* 7.6* 8.1*  HCT 23.4* 24.6* 25.4*  MCV 66.7* 66.7* 68.6*  PLT 442* 480* 414*   Cardiac Enzymes:  Recent Labs Lab 10/14/14 1143 10/14/14 1921 10/14/14 2310  TROPONINI <0.30 <0.30 <0.30   BNP (last 3 results)  Recent Labs  10/14/14 1143  PROBNP 790.6*   CBG: No results for input(s): GLUCAP in the last 168 hours.  No results found for this or any previous visit (from the past 240 hour(s)).   Studies: Dg Chest Port 1 View  10/14/2014   CLINICAL DATA:  Chest pain  EXAM: PORTABLE CHEST - 1 VIEW  COMPARISON:  04/23/2013  FINDINGS: Cardiac enlargement. Pulmonary vascular congestion with mild edema. Negative for  pleural effusion. Mild atelectasis in the lung base  IMPRESSION: Congestive heart failure with mild interstitial edema.   Electronically Signed   By: Franchot Gallo M.D.   On: 10/14/2014 13:42    Scheduled Meds: . aspirin  81 mg Oral Daily  . ferrous sulfate  325 mg Oral BID WC  . [START ON 10/16/2014] Influenza vac split quadrivalent PF  0.5 mL Intramuscular Tomorrow-1000  . isosorbide mononitrate  30 mg Oral BID  . metoprolol  25 mg Oral BID  . pantoprazole  40 mg Oral Daily  . sodium chloride  3 mL Intravenous Q12H  . warfarin  7.5 mg Oral Once  . Warfarin - Pharmacist Dosing  Inpatient   Does not apply Q24H   Continuous Infusions: . sodium chloride 75 mL/hr at 10/14/14 1926  . sodium chloride 50 mL/hr at 10/14/14 1839    Principal Problem:   Anemia Active Problems:   CAD (coronary artery disease)   HTN (hypertension)   Chest pain   Atrial fibrillation   GERD (gastroesophageal reflux disease)   Hyperlipidemia   Small bowel arteriovenous malformation   Hypokalemia    Time spent: 40 minutes    [facility] Pager [phone]. If 7PM-7AM, please contact night-coverage at [email or web address], password [facility] 10/15/2014, 11:10 AM  LOS: 1 day     Patient seen and evaluated as well as independently examined. Agree with above plan. We'll plan on transfusing patient and having GI evaluate for further recommendations. Chest pain is reproducible on palpation and cardiac enzymes are negative 3

## 2014-10-15 NOTE — Care Management Utilization Note (Signed)
UR complete 

## 2014-10-16 ENCOUNTER — Telehealth (INDEPENDENT_AMBULATORY_CARE_PROVIDER_SITE_OTHER): Payer: Self-pay | Admitting: Internal Medicine

## 2014-10-16 ENCOUNTER — Telehealth (INDEPENDENT_AMBULATORY_CARE_PROVIDER_SITE_OTHER): Payer: Self-pay | Admitting: *Deleted

## 2014-10-16 ENCOUNTER — Encounter (INDEPENDENT_AMBULATORY_CARE_PROVIDER_SITE_OTHER): Payer: Self-pay | Admitting: *Deleted

## 2014-10-16 DIAGNOSIS — D6489 Other specified anemias: Secondary | ICD-10-CM

## 2014-10-16 DIAGNOSIS — D509 Iron deficiency anemia, unspecified: Secondary | ICD-10-CM

## 2014-10-16 DIAGNOSIS — I509 Heart failure, unspecified: Secondary | ICD-10-CM

## 2014-10-16 DIAGNOSIS — D500 Iron deficiency anemia secondary to blood loss (chronic): Secondary | ICD-10-CM

## 2014-10-16 DIAGNOSIS — D649 Anemia, unspecified: Secondary | ICD-10-CM | POA: Diagnosis not present

## 2014-10-16 LAB — TYPE AND SCREEN
ABO/RH(D): O POS
Antibody Screen: POSITIVE
DAT, IgG: NEGATIVE
DONOR AG TYPE: NEGATIVE
Donor AG Type: NEGATIVE
Unit division: 0
Unit division: 0

## 2014-10-16 LAB — BASIC METABOLIC PANEL
ANION GAP: 12 (ref 5–15)
BUN: 7 mg/dL (ref 6–23)
CHLORIDE: 107 meq/L (ref 96–112)
CO2: 23 mEq/L (ref 19–32)
Calcium: 8.9 mg/dL (ref 8.4–10.5)
Creatinine, Ser: 0.73 mg/dL (ref 0.50–1.10)
GFR, EST NON AFRICAN AMERICAN: 80 mL/min — AB (ref >90)
Glucose, Bld: 80 mg/dL (ref 70–99)
POTASSIUM: 3.7 meq/L (ref 3.7–5.3)
SODIUM: 142 meq/L (ref 137–147)

## 2014-10-16 LAB — CBC
HCT: 28 % — ABNORMAL LOW (ref 36.0–46.0)
Hemoglobin: 9.1 g/dL — ABNORMAL LOW (ref 12.0–15.0)
MCH: 22.5 pg — ABNORMAL LOW (ref 26.0–34.0)
MCHC: 32.5 g/dL (ref 30.0–36.0)
MCV: 69.3 fL — ABNORMAL LOW (ref 78.0–100.0)
PLATELETS: 404 10*3/uL — AB (ref 150–400)
RBC: 4.04 MIL/uL (ref 3.87–5.11)
RDW: 21.3 % — AB (ref 11.5–15.5)
WBC: 6.8 10*3/uL (ref 4.0–10.5)

## 2014-10-16 LAB — PROTIME-INR
INR: 2.06 — AB (ref 0.00–1.49)
PROTHROMBIN TIME: 23.4 s — AB (ref 11.6–15.2)

## 2014-10-16 MED ORDER — FUROSEMIDE 40 MG PO TABS: 40.0000 mg | ORAL_TABLET | Freq: Every day | ORAL | Status: AC | PRN

## 2014-10-16 NOTE — Telephone Encounter (Signed)
H and H in 2 weeks per Dr. Laural Golden

## 2014-10-16 NOTE — Progress Notes (Signed)
AVS reviewed with pt and daughter at bedside. Follow-up appointment made for 10/29/2014 with Dr. Laural Golden, pt verbalized understanding of the importance of making her appointment and rescheduling it if needed in order to have her hemoglobin rechecked. Pt and daughter verbalized understanding and had no questions. Pt discharged with daughter via wheelchair.

## 2014-10-16 NOTE — Discharge Summary (Signed)
Physician Discharge Summary  Yesenia Woods WRU:[id number] DOB: [birth year] DOA: 10/14/2014  PCP: Nicholes Stairs, MD  Admit date: 10/14/2014 Discharge date: 10/16/2014  Time spent: 40 minutes  Recommendations for Outpatient Follow-up:  1. Dr Laural Golden in 2 weeks for evaluation of hg and monitoring of known gi bleed.   Discharge Diagnoses:  Principal Problem:   Anemia Active Problems:   CAD (coronary artery disease)   HTN (hypertension)   Chest pain   Atrial fibrillation   GERD (gastroesophageal reflux disease)   Hyperlipidemia   Small bowel arteriovenous malformation   Hypokalemia   Discharge Condition: stable  Diet recommendation: carb modified  Filed Weights   10/14/14 1123 10/16/14 0440  Weight: 63.957 kg (141 lb) 65.091 kg (143 lb 8 oz)    History of present illness:   79 year old woman presented to ED on 10/14/14 cc CP for previous 4-5 days. Associated w/ SOB, but denied palpitations, nausea, radiation. Substernal in location. reportedly comes and goes over past several days, but today it is constant. Denied any hematemesis, melena. Pt was at her GI physicians office and complained of CP and told to come to ED. Alleviated by rest and worsened by activity. No change w/ movement of upper body or w/ food. Sharp in nature.  Hospital Course:  Anemia: microcytic. Hgb 9.1 s/p 2 unit PRBC. Baseline ~11. With Hx GI bleed related to AV malformation and duodenal ulcer and IDA. She was on coumadin and asa and iron. Concern for intermittent GI loss. Evaluated by GI who opine not active bleed in known intermittent GI bleed with neg guac stool. Recommended transfusion and OP follow up.  She remained hemodynamically stable. No s/sx overt bleeding. Ambulated in hall without difficulty. Will follow up Dr Laural Golden 2 weeks to repeat hemoccult and track Hg.evaluate for endo. Will stop asa.  - CChest pain: resolved at discharge.  Likely related to anemia.Marland Kitchen CXR w/ mild pulmonary congestion  w/o PNA. Troponin Neg x3. BNP 790, last Echo EF 55-60% w/o mention of diastolic dysfunction. EKG w/o signs of ACS. No events on tele. Substernal pain reproducable. Continue home lasix, imdur and BB. Scheduled to see Dr Stanford Breed next month. Continue to monitor  HTN: controlled  Afib: Rate control. INR 2.39 on admission. Resume coumadin.  Hypokalemia: resolved at discharge  GERD:  -  Procedures:  none  Consultations:  Dr Laural Golden GI  Discharge Exam: Filed Vitals:   10/16/14 1001  BP:   Pulse: 66  Temp:   Resp:     General: well nourished NAD Cardiovascular: irregularly irregular +Murmur no LE edema Respiratory: normal effort BS clear bilaterally no wheeze  Discharge Instructions You were cared for by a hospitalist during your hospital stay. If you have any questions about your discharge medications or the care you received while you were in the hospital after you are discharged, you can call the unit and asked to speak with the hospitalist on call if the hospitalist that took care of you is not available. Once you are discharged, your primary care physician will handle any further medical issues. Please note that NO REFILLS for any discharge medications will be authorized once you are discharged, as it is imperative that you return to your primary care physician (or establish a relationship with a primary care physician if you do not have one) for your aftercare needs so that they can reassess your need for medications and monitor your lab values.   Current Discharge Medication List    CONTINUE these  medications which have CHANGED   Details  furosemide (LASIX) 40 MG tablet Take 1 tablet (40 mg total) by mouth daily as needed for fluid or edema. Qty: 30 tablet, Refills: 0      CONTINUE these medications which have NOT CHANGED   Details  acetaminophen (TYLENOL) 500 MG tablet Take 1,000 mg by mouth daily as needed for moderate pain.    BREO ELLIPTA 100-25 MCG/INH AEPB  Inhale 1 puff into the lungs daily.     ferrous sulfate 325 (65 FE) MG tablet Take 325 mg by mouth daily with breakfast.    isosorbide mononitrate (IMDUR) 30 MG 24 hr tablet Take 30 mg by mouth 2 (two) times daily.     metoprolol (LOPRESSOR) 50 MG tablet Take 25 mg by mouth 2 (two) times daily.     omeprazole (PRILOSEC) 20 MG capsule Take 20 mg by mouth 2 (two) times daily before a meal. 1/2 pill atwice a day.    PROAIR HFA 108 (90 BASE) MCG/ACT inhaler Inhale 2 puffs into the lungs every 6 (six) hours as needed.     !! warfarin (COUMADIN) 4 MG tablet Take 4 mg by mouth daily. 4 mg on Monday's and 7.5 mg on all other days.    !! warfarin (COUMADIN) 7.5 MG tablet Take 1 tablet by mouth daily. 7.5 mg daily except on Monday's, take 4 mg.     !! - Potential duplicate medications found. Please discuss with provider.    STOP taking these medications     aspirin 81 MG tablet        No Known Allergies Follow-up Information    Follow up with REHMAN,NAJEEB U, MD. Schedule an appointment as soon as possible for a visit in 2 weeks.   Specialty:  Gastroenterology   Why:  for hospital follow up and tracking of Hg.   Contact information:   [address], [address] [place] [address] [phone]        The results of significant diagnostics from this hospitalization (including imaging, microbiology, ancillary and laboratory) are listed below for reference.    Significant Diagnostic Studies: Dg Chest Port 1 View  10/14/2014   CLINICAL DATA:  Chest pain  EXAM: PORTABLE CHEST - 1 VIEW  COMPARISON:  04/23/2013  FINDINGS: Cardiac enlargement. Pulmonary vascular congestion with mild edema. Negative for pleural effusion. Mild atelectasis in the lung base  IMPRESSION: Congestive heart failure with mild interstitial edema.   Electronically Signed   By: Franchot Gallo M.D.   On: 10/14/2014 13:42    Microbiology: No results found for this or any previous visit (from the past 240 hour(s)).    Labs: Basic Metabolic Panel:  Recent Labs Lab 10/14/14 1143 10/15/14 0453 10/16/14 0549  NA 143 139 142  K 4.3 3.4* 3.7  CL 106 104 107  CO2 23 22 23   GLUCOSE 90 84 80  BUN 13 12 7   CREATININE 0.85 0.78 0.73  CALCIUM 9.7 9.1 8.9   Liver Function Tests:  Recent Labs Lab 10/15/14 0453  AST 10  ALT 8  ALKPHOS 76  BILITOT 1.0  PROT 6.2  ALBUMIN 3.1*   No results for input(s): LIPASE, AMYLASE in the last 168 hours. No results for input(s): AMMONIA in the last 168 hours. CBC:  Recent Labs Lab 10/14/14 1143 10/14/14 1921 10/15/14 0453 10/16/14 0549  WBC 6.8 7.0 6.5 6.8  NEUTROABS 4.3  --   --   --   HGB 7.2* 7.6* 8.1* 9.1*  HCT 23.4* 24.6* 25.4* 28.0*  MCV 66.7* 66.7* 68.6* 69.3*  PLT 442* 480* 414* 404*   Cardiac Enzymes:  Recent Labs Lab 10/14/14 1143 10/14/14 1921 10/14/14 2310  TROPONINI <0.30 <0.30 <0.30   BNP: BNP (last 3 results)  Recent Labs  10/14/14 1143  PROBNP 790.6*   CBG: No results for input(s): GLUCAP in the last 168 hours.     SignedRadene Gunning  [facility] 10/16/2014, 1:12 PM

## 2014-10-16 NOTE — Plan of Care (Signed)
Problem: Phase I Progression Outcomes Goal: Pain controlled with appropriate interventions Outcome: Completed/Met Date Met:  10/16/14 Goal: Initial discharge plan identified Outcome: Completed/Met Date Met:  10/16/14 Goal: Hemodynamically stable Outcome: Completed/Met Date Met:  10/16/14 Goal: Other Phase I Outcomes/Goals Outcome: Not Applicable Date Met:  16/10/96  Problem: Phase II Progression Outcomes Goal: Progress activity as tolerated unless otherwise ordered Outcome: Completed/Met Date Met:  10/16/14 Goal: Discharge plan established Outcome: Completed/Met Date Met:  10/16/14 Goal: Vital signs remain stable Outcome: Completed/Met Date Met:  10/16/14 Goal: IV changed to normal saline lock Outcome: Completed/Met Date Met:  10/16/14 Goal: Obtain order to discontinue catheter if appropriate Outcome: Not Applicable Date Met:  04/54/09 Goal: Other Phase II Outcomes/Goals Outcome: Not Applicable Date Met:  81/19/14  Problem: Phase III Progression Outcomes Goal: Pain controlled on oral analgesia Outcome: Completed/Met Date Met:  10/16/14 Goal: Activity at appropriate level-compared to baseline (UP IN CHAIR FOR HEMODIALYSIS)  Outcome: Completed/Met Date Met:  10/16/14 Goal: Voiding independently Outcome: Completed/Met Date Met:  10/16/14 Goal: IV/normal saline lock discontinued Outcome: Completed/Met Date Met:  10/16/14 Goal: Foley discontinued Outcome: Not Applicable Date Met:  78/29/56 Goal: Discharge plan remains appropriate-arrangements made Outcome: Completed/Met Date Met:  10/16/14 Goal: Other Phase III Outcomes/Goals Outcome: Not Applicable Date Met:  21/30/86  Problem: Discharge Progression Outcomes Goal: Discharge plan in place and appropriate Outcome: Completed/Met Date Met:  10/16/14 Goal: Pain controlled with appropriate interventions Outcome: Completed/Met Date Met:  10/16/14 Goal: Hemodynamically stable Outcome: Completed/Met Date Met:  57/84/69 Goal:  Complications resolved/controlled Outcome: Completed/Met Date Met:  10/16/14 Goal: Tolerating diet Outcome: Completed/Met Date Met:  10/16/14 Goal: Activity appropriate for discharge plan Outcome: Completed/Met Date Met:  10/16/14 Pt discharged with cane. Ambulating with a steady gait. Goal: Other Discharge Outcomes/Goals Outcome: Completed/Met Date Met:  10/16/14 Follow up appointment made with Dr. Laural Golden for 10/29/2014. Pt educated on how to change appointment time if needed and the importance of having her hemoglobin rechecked at this appointment.

## 2014-10-16 NOTE — Progress Notes (Signed)
Feel better. No black stools or BRRB. Hemoglobin 9.1 this am.  Stool guaiac negative this admission.  Hx of recurrent GI bleed in the setting of chronic anticoagulation and low dose aspirin use.  Hx of PUD and small mild angiodysplasia. She has received 2 units of PRBCs this admission.       CBC Latest Ref Rng 10/16/2014 10/15/2014 10/14/2014  WBC 4.0 - 10.5 K/uL 6.8 6.5 7.0  Hemoglobin 12.0 - 15.0 g/dL 9.1(L) 8.1(L) 7.6(L)  Hematocrit 36.0 - 46.0 % 28.0(L) 25.4(L) 24.6(L)  Platelets 150 - 400 K/uL 404(H) 414(H) 480(H)    Assessment; #1. Anemia most likely secondary to intermittent GI bleed which has been well documented in the past. Stool is guaiac negative.   OV in 4 weeks. H and H in 2 weeks.

## 2014-10-16 NOTE — Telephone Encounter (Signed)
Lab is noted for 10/30/14. A letter has been mailed to the patient as a reminder.

## 2014-10-16 NOTE — Telephone Encounter (Signed)
Per Dr.Rehman the patient will need to have labs drawn in 2 weeks. 

## 2014-10-16 NOTE — Evaluation (Signed)
Physical Therapy Evaluation Patient Details Name: Yesenia Woods MRN: [id number] DOB: 09/15/1935 Today's Date: 10/16/2014   History of Present Illness  Pt is a 79 year old female with complaints of CP for past 4-5 days. Associated w/ SOB, but denies palpitations, nausea, radiation. Substernal in location. Comes and goes over past several days, but today it is constant. Denies any hematemesis, melena. Pt was at her GI physicians office today and complained of CP and told to come to ED.  Alleviated by rest and worsened by activity. No change w/ movement of upper body or w/ food. Sharp in nature.  Clinical Impression  Pt is a 79 year old female who presents to PT with dx of anemia.  No complaints of pain today, though pt reports pain in chest and Rt LE comes on suddenly sometimes.  During evaluation, pt was (I) with bed mobility skills, mod (I) with transfers, and supervision with gait with use of std cane.  Pt was able to ascend/descend stairs with use of 1 handrail and step to gait pattern; noted deviation in descending stairs as pt turns slightly to the side to descend secondary to difficulty with eccentric control.  Pt is currently at baseline level of function, though did require use of std cane for gait on even terrain which pt reports she usually only uses outside of the home. Pt has a cane from her deceased daughter, though reports it is not fit for her height; recommend use of cane for gait at home until activity tolerance improves and for safety.   No follow up PT recommended.  Pt will be d/c from acute PT services.     Follow Up Recommendations No PT follow up    Equipment Recommendations  Cane (Pt has her deceased daughters cane, though reports it is unable to be fitted for her height. )       Precautions / Restrictions Precautions Precautions: Fall Restrictions Weight Bearing Restrictions: No      Mobility  Bed Mobility Overal bed mobility: Independent                 Transfers Overall transfer level: Modified independent Equipment used: Straight cane                Ambulation/Gait Ambulation/Gait assistance: Supervision Ambulation Distance (Feet): 250 Feet Assistive device: Straight cane Gait Pattern/deviations: Step-through pattern;Decreased dorsiflexion - right;Decreased dorsiflexion - left   Gait velocity interpretation: Below normal speed for age/gender    Stairs Stairs: Yes Stairs assistance: Supervision Stair Management: One rail Right;Step to pattern Number of Stairs: 4 General stair comments: Pt able to ascend/descend stairs with step to gait pattern with 1 handrail assist.  Noted pt slightly turned sideways to descend stairs secodary decreased eccentric control of LE.      Balance Overall balance assessment: No apparent balance deficits (not formally assessed)                                           Pertinent Vitals/Pain Pain Assessment: No/denies pain    Home Living Family/patient expects to be discharged to:: Private residence Living Arrangements: Children Available Help at Discharge: Family;Available PRN/intermittently (Daughter works during the day) Type of Home: House Home Access: Stairs to enter Entrance Stairs-Rails: Right Entrance Stairs-Number of Steps: [place]: One level   Additional Comments: Tub Shower    Prior Function Level of Independence:  Independent with assistive device(s)         Comments: Pt reports she has her deceased daughters walking stick which she uses outside of the home, though not for her height.  Pt is (I) with bed mobility skills, transfers, and household amb without AD.      Hand Dominance   Dominant Hand: Right    Extremity/Trunk Assessment               Lower Extremity Assessment: Overall WFL for tasks assessed         Communication   Communication: No difficulties  Cognition Arousal/Alertness: Awake/alert Behavior During  Therapy: WFL for tasks assessed/performed Overall Cognitive Status: Within Functional Limits for tasks assessed                       Assessment/Plan    PT Assessment Patent does not need any further PT services  PT Diagnosis     PT Problem List    PT Treatment Interventions     PT Goals (Current goals can be found in the Care Plan section) Acute Rehab PT Goals PT Goal Formulation: All assessment and education complete, DC therapy     End of Session Equipment Utilized During Treatment: Gait belt Activity Tolerance: Patient tolerated treatment well Patient left: in bed;with call bell/phone within reach;with bed alarm set           Time: 3716-9678 PT Time Calculation (min) (ACUTE ONLY): 19 min   Charges:   PT Evaluation $Initial PT Evaluation Tier I: 1 Procedure     Yesenia Woods 10/16/2014, 9:02 AM

## 2014-10-25 ENCOUNTER — Ambulatory Visit: Payer: Medicare Other | Admitting: Cardiology

## 2014-10-29 ENCOUNTER — Ambulatory Visit (INDEPENDENT_AMBULATORY_CARE_PROVIDER_SITE_OTHER): Payer: Medicare Other | Admitting: Internal Medicine

## 2014-11-13 ENCOUNTER — Encounter (INDEPENDENT_AMBULATORY_CARE_PROVIDER_SITE_OTHER): Payer: Self-pay

## 2014-11-21 ENCOUNTER — Encounter: Payer: Self-pay | Admitting: Pulmonary Disease

## 2014-11-21 ENCOUNTER — Other Ambulatory Visit: Payer: Self-pay | Admitting: Pulmonary Disease

## 2014-11-21 ENCOUNTER — Ambulatory Visit (INDEPENDENT_AMBULATORY_CARE_PROVIDER_SITE_OTHER): Payer: Medicare Other | Admitting: Pulmonary Disease

## 2014-11-21 VITALS — BP 158/82 | HR 54 | Ht 60.0 in | Wt 140.0 lb

## 2014-11-21 DIAGNOSIS — I272 Pulmonary hypertension, unspecified: Secondary | ICD-10-CM

## 2014-11-21 DIAGNOSIS — Z5181 Encounter for therapeutic drug level monitoring: Secondary | ICD-10-CM | POA: Diagnosis not present

## 2014-11-21 DIAGNOSIS — I4891 Unspecified atrial fibrillation: Secondary | ICD-10-CM | POA: Diagnosis not present

## 2014-11-21 DIAGNOSIS — Z7901 Long term (current) use of anticoagulants: Secondary | ICD-10-CM

## 2014-11-21 DIAGNOSIS — J441 Chronic obstructive pulmonary disease with (acute) exacerbation: Secondary | ICD-10-CM

## 2014-11-21 DIAGNOSIS — J439 Emphysema, unspecified: Secondary | ICD-10-CM | POA: Diagnosis not present

## 2014-11-21 DIAGNOSIS — I270 Primary pulmonary hypertension: Secondary | ICD-10-CM

## 2014-11-21 NOTE — Patient Instructions (Signed)
Blood work  Use nebuliser as needed only for wheezing Stay on breo We will obtain records from [place]

## 2014-11-21 NOTE — Progress Notes (Signed)
Subjective:    Patient ID: Yesenia Woods, female    DOB: [birth year], 80 y.o.   MRN: [id number]  HPI   80 year old ex-smoker presents for evaluation of COPD accompanied by her daughter, Terri Piedra . She is moved from [place] to [place] to be with her daughter, and wishes to establish care with [place]. She was seen by pulmonologist in [place] and diagnosed with COPD. She was placed on breo and albuterol and Atrovent nebs which she uses as needed. She has been maintained on oxygen 2 L nasal cannula for 2 years provided by [employer]. She gets around with a motorized wheelchair, reports class II dyspnea and is able to ambulate around the house. She denies cough reports acid heartburn and some difficulty swallowing She smoked about a pack per day starting at age 89 until she quit at age 79-about 30-pack-years. She drinks a glass of wine daily She is maintained on Coumadin for atrial fibrillation. She was admitted to [facility] December 2015 for anemia requiring blood transfusion, hemoglobin was 7.2-improved to 9.1. She has been seen by GI (Rehman), no source of bleeding was found. Her last INR was checked in December Echo 04/2013-shows normal LV function, RVSP 55 Chest x-ray 10/14/14 showed cardiomegaly and interstitial infiltrates consistent with CHF She takes Lasix pill as needed for pedal edema  She has an appointment to set up with new PCP- blythe- only in May  Past Medical History  Diagnosis Date  . Myocardial infarct   . Cancer     breast  . Hypertension   . Shortness of breath   . Anemia   . Headache(784.0)   . Arthritis   . Duodenal ulcer   . Small bowel arteriovenous malformation     Past Surgical History  Procedure Laterality Date  . Coronary artery bypass graft    . Eye surgery    . Breast surgery      Left mastectomy  . Right hand surgery    . Esophagogastroduodenoscopy  07/22/2011    Procedure: ESOPHAGOGASTRODUODENOSCOPY (EGD);  Surgeon: Rogene Houston, MD;  [facility];  Service: Endoscopy;  Laterality: N/A;  . Colonoscopy  07/22/2011    Procedure: COLONOSCOPY;  Surgeon: Rogene Houston, MD;  Location[facility];  Service: Endoscopy;  Laterality: N/A;  . Givens capsule study  07/30/2011    Procedure: GIVENS CAPSULE STUDY;  Surgeon: Rogene Houston, MD;  Location: [facility];  Service: Endoscopy;  Laterality: N/A;  7:30  . Esophagogastroduodenoscopy N/A 04/23/2013    Procedure: ESOPHAGOGASTRODUODENOSCOPY (EGD);  Surgeon: Rogene Houston, MD;  [facility];  Service: Endoscopy;  Laterality: N/A;   No Known Allergies  History   Social History  . Marital Status: Widowed    Spouse Name: N/A    Number of Children: N/A  . Years of Education: N/A   Occupational History  . Not on file.   Social History Main Topics  . Smoking status: Former Smoker -- 1.00 packs/day for 30 years    Quit date: 10/14/1994  . Smokeless tobacco: Never Used     Comment: Patient quit smoking 25 years ago  . Alcohol Use: No     Comment: none for 8 months  . Drug Use: No  . Sexual Activity: Not on file   Other Topics Concern  . Not on file   Social History Narrative    Family History  Problem Relation Age of Onset  . Prostate cancer Brother   .  Prostate cancer Brother   . Breast cancer Daughter     Breast Cancer that mets  . Healthy Daughter   . Healthy Son   . Healthy Son   . Healthy Son   . Healthy Son   . Healthy Son      Review of Systems  Constitutional: Negative for fever and unexpected weight change.  HENT: Negative for congestion, dental problem, ear pain, nosebleeds, postnasal drip, rhinorrhea, sinus pressure, sneezing, sore throat and trouble swallowing.   Eyes: Negative for redness and itching.  Respiratory: Positive for choking and shortness of breath. Negative for cough, chest tightness and wheezing.   Cardiovascular: Negative for palpitations and leg swelling.  Gastrointestinal: Negative for nausea and vomiting.    Genitourinary: Negative for dysuria.  Musculoskeletal: Negative for joint swelling.  Skin: Negative for rash.  Neurological: Negative for headaches.  Hematological: Does not bruise/bleed easily.  Psychiatric/Behavioral: Negative for dysphoric mood. The patient is not nervous/anxious.        Objective:   Physical Exam  Gen. Pleasant, well-nourished, in no distress, normal affect, on nasal cannula ENT - no lesions, no post nasal drip Neck: No JVD, no thyromegaly, no carotid bruits Lungs: no use of accessory muscles, no dullness to percussion, coarse breath sounds without rales or rhonchi  Cardiovascular: Rhythm regular, heart sounds  normal, no murmurs or gallops, no peripheral edema Abdomen: soft and non-tender, no hepatosplenomegaly, BS normal. Musculoskeletal: No deformities, no cyanosis or clubbing Neuro:  alert, non focal       Assessment & Plan:

## 2014-11-22 ENCOUNTER — Encounter: Payer: Self-pay | Admitting: Pulmonary Disease

## 2014-11-22 DIAGNOSIS — I272 Pulmonary hypertension, unspecified: Secondary | ICD-10-CM | POA: Insufficient documentation

## 2014-11-22 DIAGNOSIS — J449 Chronic obstructive pulmonary disease, unspecified: Secondary | ICD-10-CM | POA: Insufficient documentation

## 2014-11-22 LAB — PROTIME-INR
INR: 1.65 — ABNORMAL HIGH (ref <1.50)
Prothrombin Time: 19.5 seconds — ABNORMAL HIGH (ref 11.6–15.2)

## 2014-11-22 LAB — COMPREHENSIVE METABOLIC PANEL
ALBUMIN: 4 g/dL (ref 3.5–5.2)
ALK PHOS: 105 U/L (ref 39–117)
ALT: 8 U/L (ref 0–35)
AST: 14 U/L (ref 0–37)
BUN: 13 mg/dL (ref 6–23)
CALCIUM: 10 mg/dL (ref 8.4–10.5)
CO2: 26 mEq/L (ref 19–32)
CREATININE: 0.71 mg/dL (ref 0.50–1.10)
Chloride: 108 mEq/L (ref 96–112)
Glucose, Bld: 83 mg/dL (ref 70–99)
Potassium: 3.7 mEq/L (ref 3.5–5.3)
SODIUM: 137 meq/L (ref 135–145)
Total Bilirubin: 0.9 mg/dL (ref 0.2–1.2)
Total Protein: 6.9 g/dL (ref 6.0–8.3)

## 2014-11-22 NOTE — Assessment & Plan Note (Signed)
Use nebuliser as needed only for wheezing Stay on breo We will obtain records from [place] -including pulmonary function tests Oximetry will be obtained in the future

## 2014-11-22 NOTE — Assessment & Plan Note (Signed)
Continue oxygen Lasix as needed Check for anemia

## 2014-11-22 NOTE — Assessment & Plan Note (Signed)
Repeat INR She will need follow-up with Coumadin clinic-she has appointment pending with cardiology and will defer to them

## 2014-11-27 ENCOUNTER — Encounter: Payer: Self-pay | Admitting: Cardiology

## 2014-11-27 ENCOUNTER — Ambulatory Visit (INDEPENDENT_AMBULATORY_CARE_PROVIDER_SITE_OTHER): Payer: Medicare Other | Admitting: Cardiology

## 2014-11-27 VITALS — BP 132/64 | HR 54 | Ht 60.0 in | Wt 148.0 lb

## 2014-11-27 DIAGNOSIS — I2510 Atherosclerotic heart disease of native coronary artery without angina pectoris: Secondary | ICD-10-CM

## 2014-11-27 DIAGNOSIS — R072 Precordial pain: Secondary | ICD-10-CM | POA: Diagnosis not present

## 2014-11-27 DIAGNOSIS — E785 Hyperlipidemia, unspecified: Secondary | ICD-10-CM | POA: Diagnosis not present

## 2014-11-27 DIAGNOSIS — I10 Essential (primary) hypertension: Secondary | ICD-10-CM

## 2014-11-27 DIAGNOSIS — I482 Chronic atrial fibrillation, unspecified: Secondary | ICD-10-CM

## 2014-11-27 DIAGNOSIS — I2583 Coronary atherosclerosis due to lipid rich plaque: Secondary | ICD-10-CM

## 2014-11-27 MED ORDER — ATORVASTATIN CALCIUM 20 MG PO TABS
20.0000 mg | ORAL_TABLET | Freq: Every day | ORAL | Status: DC
Start: 2014-11-27 — End: 2014-11-27

## 2014-11-27 NOTE — Assessment & Plan Note (Signed)
Not on aspirin given need for Coumadin. Add Lipitor 20 mg daily. Check lipids and liver in 6 weeks.

## 2014-11-27 NOTE — Assessment & Plan Note (Signed)
Patient sounds to have chronic chest pain. She has had a catheterization and nuclear study within the past year by her report. We will obtain those records from [place].Marland Kitchen

## 2014-11-27 NOTE — Progress Notes (Signed)
HPI: 80 year old female for evaluation of coronary artery disease and atrial fibrillation. Patient has had previous coronary artery bypass graft years ago.this was performed at [facility]. No records available. Echocardiogram in June 2014 showed normal LV function. There was trace aortic insufficiency and mild mitral regurgitation. Mild biatrial enlargement. Moderately elevated pulmonary pressures. Patient recently moved to this area to be with her daughter. She apparently has had chest pain for years. She states she had a catheterization and nuclear study within the past year in [place]. I do not have those records available. Her pain can last for a week at a time. It increases with certain movements. She also can have pain after eating. She has some dyspnea on exertion but no orthopnea, PND, pedal edema or syncope. She does have chronic anemia.  Current Outpatient Prescriptions  Medication Sig Dispense Refill  . acetaminophen (TYLENOL) 500 MG tablet Take 1,000 mg by mouth daily as needed for moderate pain.    Marland Kitchen BREO ELLIPTA 100-25 MCG/INH AEPB Inhale 1 puff into the lungs daily.     . ferrous sulfate 325 (65 FE) MG tablet Take 325 mg by mouth daily with breakfast.    . furosemide (LASIX) 40 MG tablet Take 1 tablet (40 mg total) by mouth daily as needed for fluid or edema. 30 tablet 0  . ipratropium-albuterol (DUONEB) 0.5-2.5 (3) MG/3ML SOLN Take 3 mLs by nebulization.    . isosorbide mononitrate (IMDUR) 30 MG 24 hr tablet Take 30 mg by mouth 2 (two) times daily.     . metoprolol (LOPRESSOR) 50 MG tablet Take 25 mg by mouth 2 (two) times daily.     Marland Kitchen omeprazole (PRILOSEC) 20 MG capsule Take 20 mg by mouth 2 (two) times daily before a meal. 1/2 pill atwice a day.    Marland Kitchen PROAIR HFA 108 (90 BASE) MCG/ACT inhaler Inhale 2 puffs into the lungs every 6 (six) hours as needed.     . warfarin (COUMADIN) 4 MG tablet Take 4 mg by mouth daily. 4 mg on Monday's and 7.5 mg on all other days.    Marland Kitchen warfarin  (COUMADIN) 7.5 MG tablet Take 1 tablet by mouth daily. 7.5 mg daily except on Monday's, take 4 mg.     No current facility-administered medications for this visit.    No Known Allergies   Past Medical History  Diagnosis Date  . Myocardial infarct   . Cancer     breast  . Hypertension   . Anemia   . Headache(784.0)   . Arthritis   . Duodenal ulcer   . Small bowel arteriovenous malformation   . Hyperlipidemia   . COPD (chronic obstructive pulmonary disease)   . Diverticulitis   . Atrial fibrillation     Past Surgical History  Procedure Laterality Date  . Coronary artery bypass graft    . Eye surgery    . Breast surgery      Left mastectomy  . Right hand surgery    . Esophagogastroduodenoscopy  07/22/2011    Procedure: ESOPHAGOGASTRODUODENOSCOPY (EGD);  Surgeon: Rogene Houston, MD;  [facility];  Service: Endoscopy;  Laterality: N/A;  . Colonoscopy  07/22/2011    Procedure: COLONOSCOPY;  Surgeon: Rogene Houston, MD;  [facility];  Service: Endoscopy;  Laterality: N/A;  . Givens capsule study  07/30/2011    Procedure: GIVENS CAPSULE STUDY;  Surgeon: Rogene Houston, MD;  [facility];  Service: Endoscopy;  Laterality: N/A;  7:30  . Esophagogastroduodenoscopy N/A 04/23/2013    Procedure: ESOPHAGOGASTRODUODENOSCOPY (EGD);  Surgeon: Rogene Houston, MD;  [facility];  Service: Endoscopy;  Laterality: N/A;    History   Social History  . Marital Status: Widowed    Spouse Name: N/A    Number of Children: N/A  . Years of Education: N/A   Occupational History  . Not on file.   Social History Main Topics  . Smoking status: Former Smoker -- 1.00 packs/day for 30 years    Quit date: 10/14/1994  . Smokeless tobacco: Never Used     Comment: Patient quit smoking 25 years ago  . Alcohol Use: No     Comment: none for 8 months  . Drug Use: No  . Sexual Activity: Not on file   Other Topics Concern  . Not on file   Social History  Narrative    Family History  Problem Relation Age of Onset  . Prostate cancer Brother   . Prostate cancer Brother   . Breast cancer Daughter     Breast Cancer that mets  . Healthy Daughter   . Healthy Son   . Healthy Son   . Healthy Son   . Healthy Son   . Healthy Son   . CAD Son     ROS: no fevers or chills, productive cough, hemoptysis, dysphasia, odynophagia, melena, hematochezia, dysuria, hematuria, rash, seizure activity, orthopnea, PND, pedal edema, claudication. Remaining systems are negative.  Physical Exam:   Blood pressure 132/64, pulse 54, height 5' (1.524 m), weight 148 lb (67.132 kg), SpO2 96 %.  General:  Well developed/well nourished in NAD Skin warm/dry Patient not depressed No peripheral clubbing Back-normal HEENT-normal/normal eyelids Neck supple/normal carotid upstroke bilaterally; no bruits; no JVD; no thyromegaly chest - CTA/ normal expansion CV -irregular/normal S1 and S2; no murmurs, rubs or gallops;  PMI nondisplaced Abdomen -NT/ND, no HSM, no mass, + bowel sounds, no bruit 2+ femoral pulses, no bruits Ext-no edema, chords, 2+ DP Neuro-grossly nonfocal  ECG 10/14/2014-atrial fibrillation, nonspecific ST changes.  EKG today shows atrial fibrillation at a rate of 54. Nonspecific ST changes.

## 2014-11-27 NOTE — Assessment & Plan Note (Signed)
Patient has permanent atrial fibrillation. Continue beta blocker for rate control. Continue Coumadin. Refer to the Coumadin clinic.

## 2014-11-27 NOTE — Assessment & Plan Note (Signed)
Blood pressure controlled. Continue present medications. 

## 2014-11-27 NOTE — Patient Instructions (Addendum)
Your physician wants you to follow-up in: [place] will receive a reminder letter in the mail two months in advance. If you don't receive a letter, please call our office to schedule the follow-up appointment.   Your physician recommends that you HAVE LAB [place] ABOUT LAB WORK TODAY AND NEXT APPOINTMENT

## 2014-11-27 NOTE — Assessment & Plan Note (Signed)
Add Lipitor 20 mg daily. Check lipids and liver in 6 weeks. 

## 2014-12-03 ENCOUNTER — Telehealth: Payer: Self-pay | Admitting: Cardiology

## 2014-12-03 NOTE — Telephone Encounter (Signed)
Requesting records from Dr Freddie Apley per Dr Stanford Breed.  Faxed signed release on 12/03/14. lp

## 2014-12-07 ENCOUNTER — Telehealth: Payer: Self-pay | Admitting: Pulmonary Disease

## 2014-12-07 NOTE — Telephone Encounter (Signed)
Reviewed PSG ([place]) - 12/2013 >> AHI 3.5, RDI 12/h, TST 291 m

## 2014-12-09 ENCOUNTER — Encounter: Payer: Self-pay | Admitting: Family Medicine

## 2014-12-09 ENCOUNTER — Ambulatory Visit (INDEPENDENT_AMBULATORY_CARE_PROVIDER_SITE_OTHER): Payer: Medicare Other | Admitting: Family Medicine

## 2014-12-09 VITALS — BP 130/76 | HR 64 | Temp 98.0°F | Ht 60.0 in | Wt 142.6 lb

## 2014-12-09 DIAGNOSIS — I270 Primary pulmonary hypertension: Secondary | ICD-10-CM

## 2014-12-09 DIAGNOSIS — I10 Essential (primary) hypertension: Secondary | ICD-10-CM | POA: Diagnosis not present

## 2014-12-09 DIAGNOSIS — K219 Gastro-esophageal reflux disease without esophagitis: Secondary | ICD-10-CM | POA: Diagnosis not present

## 2014-12-09 DIAGNOSIS — I2510 Atherosclerotic heart disease of native coronary artery without angina pectoris: Secondary | ICD-10-CM

## 2014-12-09 DIAGNOSIS — I272 Pulmonary hypertension, unspecified: Secondary | ICD-10-CM

## 2014-12-09 DIAGNOSIS — D509 Iron deficiency anemia, unspecified: Secondary | ICD-10-CM | POA: Diagnosis not present

## 2014-12-09 DIAGNOSIS — I482 Chronic atrial fibrillation, unspecified: Secondary | ICD-10-CM

## 2014-12-09 DIAGNOSIS — C50912 Malignant neoplasm of unspecified site of left female breast: Secondary | ICD-10-CM | POA: Diagnosis not present

## 2014-12-09 DIAGNOSIS — C50919 Malignant neoplasm of unspecified site of unspecified female breast: Secondary | ICD-10-CM

## 2014-12-09 HISTORY — DX: Malignant neoplasm of unspecified site of unspecified female breast: C50.919

## 2014-12-09 NOTE — Patient Instructions (Signed)
Needs monthly coumadin checks starting in early February last check 11/21/14 then needs appt with Dr Charlett Blake in 3 months  Rel of rec from [facility] in [place] on [address], labs for past 1 year  Rel of Rec Dr Loraine Leriche gastroenterology in [facility] A healthy lifestyle and preventive care can promote health and wellness. Preventive health guidelines for women include the following key practices.  A routine yearly physical is a good way to check with your health care provider about your health and preventive screening. It is a chance to share any concerns and updates on your health and to receive a thorough exam.  Visit your dentist for a routine exam and preventive care every 6 months. Brush your teeth twice a day and floss once a day. Good oral hygiene prevents tooth decay and gum disease.  The frequency of eye exams is based on your age, health, family medical history, use of contact lenses, and other factors. Follow your health care provider's recommendations for frequency of eye exams.  Eat a healthy diet. Foods like vegetables, fruits, whole grains, low-fat dairy products, and lean protein foods contain the nutrients you need without too many calories. Decrease your intake of foods high in solid fats, added sugars, and salt. Eat the right amount of calories for you.Get information about a proper diet from your health care provider, if necessary.  Regular physical exercise is one of the most important things you can do for your health. Most adults should get at least 150 minutes of moderate-intensity exercise (any activity that increases your heart rate and causes you to sweat) each week. In addition, most adults need muscle-strengthening exercises on 2 or more days a week.  Maintain a healthy weight. The body mass index (BMI) is a screening tool to identify possible weight problems. It provides an estimate of body fat based on height and  weight. Your health care provider can find your BMI and can help you achieve or maintain a healthy weight.For adults 20 years and older:  A BMI below 18.5 is considered underweight.  A BMI of 18.5 to 24.9 is normal.  A BMI of 25 to 29.9 is considered overweight.  A BMI of 30 and above is considered obese.  Maintain normal blood lipids and cholesterol levels by exercising and minimizing your intake of saturated fat. Eat a balanced diet with plenty of fruit and vegetables. Blood tests for lipids and cholesterol should begin at age 59 and be repeated every 5 years. If your lipid or cholesterol levels are high, you are over 50, or you are at high risk for heart disease, you may need your cholesterol levels checked more frequently.Ongoing high lipid and cholesterol levels should be treated with medicines if diet and exercise are not working.  If you smoke, find out from your health care provider how to quit. If you do not use tobacco, do not start.  Lung cancer screening is recommended for adults aged 11-80 years who are at high risk for developing lung cancer because of a history of smoking. A yearly low-dose CT scan of the lungs is recommended for people who have at least a 30-pack-year history of smoking and are a current smoker or have quit within the past 15 years. A pack year of smoking is smoking an average of 1 pack of cigarettes a day for 1 year (for example: 1 pack a day for 30 years or 2 packs a day for  15 years). Yearly screening should continue until the smoker has stopped smoking for at least 15 years. Yearly screening should be stopped for people who develop a health problem that would prevent them from having lung cancer treatment.  If you are pregnant, do not drink alcohol. If you are breastfeeding, be very cautious about drinking alcohol. If you are not pregnant and choose to drink alcohol, do not have more than 1 drink per day. One drink is considered to be 12 ounces (355 mL) of beer,  5 ounces (148 mL) of wine, or 1.5 ounces (44 mL) of liquor.  Avoid use of street drugs. Do not share needles with anyone. Ask for help if you need support or instructions about stopping the use of drugs.  High blood pressure causes heart disease and increases the risk of stroke. Your blood pressure should be checked at least every 1 to 2 years. Ongoing high blood pressure should be treated with medicines if weight loss and exercise do not work.  If you are 56-20 years old, ask your health care provider if you should take aspirin to prevent strokes.  Diabetes screening involves taking a blood sample to check your fasting blood sugar level. This should be done once every 3 years, after age 63, if you are within normal weight and without risk factors for diabetes. Testing should be considered at a younger age or be carried out more frequently if you are overweight and have at least 1 risk factor for diabetes.  Breast cancer screening is essential preventive care for women. You should practice "breast self-awareness." This means understanding the normal appearance and feel of your breasts and may include breast self-examination. Any changes detected, no matter how small, should be reported to a health care provider. Women in their 6s and 30s should have a clinical breast exam (CBE) by a health care provider as part of a regular health exam every 1 to 3 years. After age 49, women should have a CBE every year. Starting at age 39, women should consider having a mammogram (breast X-ray test) every year. Women who have a family history of breast cancer should talk to their health care provider about genetic screening. Women at a high risk of breast cancer should talk to their health care providers about having an MRI and a mammogram every year.  Breast cancer gene (BRCA)-related cancer risk assessment is recommended for women who have family members with BRCA-related cancers. BRCA-related cancers include breast,  ovarian, tubal, and peritoneal cancers. Having family members with these cancers may be associated with an increased risk for harmful changes (mutations) in the breast cancer genes BRCA1 and BRCA2. Results of the assessment will determine the need for genetic counseling and BRCA1 and BRCA2 testing.  Routine pelvic exams to screen for cancer are no longer recommended for nonpregnant women who are considered low risk for cancer of the pelvic organs (ovaries, uterus, and vagina) and who do not have symptoms. Ask your health care provider if a screening pelvic exam is right for you.  If you have had past treatment for cervical cancer or a condition that could lead to cancer, you need Pap tests and screening for cancer for at least 20 years after your treatment. If Pap tests have been discontinued, your risk factors (such as having a new sexual partner) need to be reassessed to determine if screening should be resumed. Some women have medical problems that increase the chance of getting cervical cancer. In these cases, your health  care provider may recommend more frequent screening and Pap tests.  The HPV test is an additional test that may be used for cervical cancer screening. The HPV test looks for the virus that can cause the cell changes on the cervix. The cells collected during the Pap test can be tested for HPV. The HPV test could be used to screen women aged 8 years and older, and should be used in women of any age who have unclear Pap test results. After the age of 13, women should have HPV testing at the same frequency as a Pap test.  Colorectal cancer can be detected and often prevented. Most routine colorectal cancer screening begins at the age of 74 years and continues through age 16 years. However, your health care provider may recommend screening at an earlier age if you have risk factors for colon cancer. On a yearly basis, your health care provider may provide home test kits to check for hidden  blood in the stool. Use of a small camera at the end of a tube, to directly examine the colon (sigmoidoscopy or colonoscopy), can detect the earliest forms of colorectal cancer. Talk to your health care provider about this at age 29, when routine screening begins. Direct exam of the colon should be repeated every 5-10 years through age 82 years, unless early forms of pre-cancerous polyps or small growths are found.  People who are at an increased risk for hepatitis B should be screened for this virus. You are considered at high risk for hepatitis B if:  You were born in a country where hepatitis B occurs often. Talk with your health care provider about which countries are considered high risk.  Your parents were born in a high-risk country and you have not received a shot to protect against hepatitis B (hepatitis B vaccine).  You have HIV or AIDS.  You use needles to inject street drugs.  You live with, or have sex with, someone who has hepatitis B.  You get hemodialysis treatment.  You take certain medicines for conditions like cancer, organ transplantation, and autoimmune conditions.  Hepatitis C blood testing is recommended for all people born from [birth year] through 1963 and any individual with known risks for hepatitis C.  Practice safe sex. Use condoms and avoid high-risk sexual practices to reduce the spread of sexually transmitted infections (STIs). STIs include gonorrhea, chlamydia, syphilis, trichomonas, herpes, HPV, and human immunodeficiency virus (HIV). Herpes, HIV, and HPV are viral illnesses that have no cure. They can result in disability, cancer, and death.  You should be screened for sexually transmitted illnesses (STIs) including gonorrhea and chlamydia if:  You are sexually active and are younger than 24 years.  You are older than 24 years and your health care provider tells you that you are at risk for this type of infection.  Your sexual activity has changed since you  were last screened and you are at an increased risk for chlamydia or gonorrhea. Ask your health care provider if you are at risk.  If you are at risk of being infected with HIV, it is recommended that you take a prescription medicine daily to prevent HIV infection. This is called preexposure prophylaxis (PrEP). You are considered at risk if:  You are a heterosexual woman, are sexually active, and are at increased risk for HIV infection.  You take drugs by injection.  You are sexually active with a partner who has HIV.  Talk with your health care provider about whether you  are at high risk of being infected with HIV. If you choose to begin PrEP, you should first be tested for HIV. You should then be tested every 3 months for as long as you are taking PrEP.  Osteoporosis is a disease in which the bones lose minerals and strength with aging. This can result in serious bone fractures or breaks. The risk of osteoporosis can be identified using a bone density scan. Women ages 51 years and over and women at risk for fractures or osteoporosis should discuss screening with their health care providers. Ask your health care provider whether you should take a calcium supplement or vitamin D to reduce the rate of osteoporosis.  Menopause can be associated with physical symptoms and risks. Hormone replacement therapy is available to decrease symptoms and risks. You should talk to your health care provider about whether hormone replacement therapy is right for you.  Use sunscreen. Apply sunscreen liberally and repeatedly throughout the day. You should seek shade when your shadow is shorter than you. Protect yourself by wearing long sleeves, pants, a wide-brimmed hat, and sunglasses year round, whenever you are outdoors.  Once a month, do a whole body skin exam, using a mirror to look at the skin on your back. Tell your health care provider of new moles, moles that have irregular borders, moles that are larger  than a pencil eraser, or moles that have changed in shape or color.  Stay current with required vaccines (immunizations).  Influenza vaccine. All adults should be immunized every year.  Tetanus, diphtheria, and acellular pertussis (Td, Tdap) vaccine. Pregnant women should receive 1 dose of Tdap vaccine during each pregnancy. The dose should be obtained regardless of the length of time since the last dose. Immunization is preferred during the 27th-36th week of gestation. An adult who has not previously received Tdap or who does not know her vaccine status should receive 1 dose of Tdap. This initial dose should be followed by tetanus and diphtheria toxoids (Td) booster doses every 10 years. Adults with an unknown or incomplete history of completing a 3-dose immunization series with Td-containing vaccines should begin or complete a primary immunization series including a Tdap dose. Adults should receive a Td booster every 10 years.  Varicella vaccine. An adult without evidence of immunity to varicella should receive 2 doses or a second dose if she has previously received 1 dose. Pregnant females who do not have evidence of immunity should receive the first dose after pregnancy. This first dose should be obtained before leaving the health care facility. The second dose should be obtained 4-8 weeks after the first dose.  Human papillomavirus (HPV) vaccine. Females aged 13-26 years who have not received the vaccine previously should obtain the 3-dose series. The vaccine is not recommended for use in pregnant females. However, pregnancy testing is not needed before receiving a dose. If a female is found to be pregnant after receiving a dose, no treatment is needed. In that case, the remaining doses should be delayed until after the pregnancy. Immunization is recommended for any person with an immunocompromised condition through the age of 62 years if she did not get any or all doses earlier. During the 3-dose  series, the second dose should be obtained 4-8 weeks after the first dose. The third dose should be obtained 24 weeks after the first dose and 16 weeks after the second dose.  Zoster vaccine. One dose is recommended for adults aged 45 years or older unless certain conditions are  present.  Measles, mumps, and rubella (MMR) vaccine. Adults born before [birth year] generally are considered immune to measles and mumps. Adults born in [birth year] or later should have 1 or more doses of MMR vaccine unless there is a contraindication to the vaccine or there is laboratory evidence of immunity to each of the three diseases. A routine second dose of MMR vaccine should be obtained at least 28 days after the first dose for students attending postsecondary schools, health care workers, or international travelers. People who received inactivated measles vaccine or an unknown type of measles vaccine during 1963-1967 should receive 2 doses of MMR vaccine. People who received inactivated mumps vaccine or an unknown type of mumps vaccine before 1979 and are at high risk for mumps infection should consider immunization with 2 doses of MMR vaccine. For females of childbearing age, rubella immunity should be determined. If there is no evidence of immunity, females who are not pregnant should be vaccinated. If there is no evidence of immunity, females who are pregnant should delay immunization until after pregnancy. Unvaccinated health care workers born before [birth year] who lack laboratory evidence of measles, mumps, or rubella immunity or laboratory confirmation of disease should consider measles and mumps immunization with 2 doses of MMR vaccine or rubella immunization with 1 dose of MMR vaccine.  Pneumococcal 13-valent conjugate (PCV13) vaccine. When indicated, a person who is uncertain of her immunization history and has no record of immunization should receive the PCV13 vaccine. An adult aged 5 years or older who has certain medical conditions  and has not been previously immunized should receive 1 dose of PCV13 vaccine. This PCV13 should be followed with a dose of pneumococcal polysaccharide (PPSV23) vaccine. The PPSV23 vaccine dose should be obtained at least 8 weeks after the dose of PCV13 vaccine. An adult aged 32 years or older who has certain medical conditions and previously received 1 or more doses of PPSV23 vaccine should receive 1 dose of PCV13. The PCV13 vaccine dose should be obtained 1 or more years after the last PPSV23 vaccine dose.  Pneumococcal polysaccharide (PPSV23) vaccine. When PCV13 is also indicated, PCV13 should be obtained first. All adults aged 15 years and older should be immunized. An adult younger than age 68 years who has certain medical conditions should be immunized. Any person who resides in a nursing home or long-term care facility should be immunized. An adult smoker should be immunized. People with an immunocompromised condition and certain other conditions should receive both PCV13 and PPSV23 vaccines. People with human immunodeficiency virus (HIV) infection should be immunized as soon as possible after diagnosis. Immunization during chemotherapy or radiation therapy should be avoided. Routine use of PPSV23 vaccine is not recommended for American Indians, [place] Natives, or people younger than 65 years unless there are medical conditions that require PPSV23 vaccine. When indicated, people who have unknown immunization and have no record of immunization should receive PPSV23 vaccine. One-time revaccination 5 years after the first dose of PPSV23 is recommended for people aged 19-64 years who have chronic kidney failure, nephrotic syndrome, asplenia, or immunocompromised conditions. People who received 1-2 doses of PPSV23 before age 72 years should receive another dose of PPSV23 vaccine at age 95 years or later if at least 5 years have passed since the previous dose. Doses of PPSV23 are not needed for people immunized  with PPSV23 at or after age 52 years.  Meningococcal vaccine. Adults with asplenia or persistent complement component deficiencies should receive 2 doses of quadrivalent meningococcal conjugate (  MenACWY-D) vaccine. The doses should be obtained at least 2 months apart. Microbiologists working with certain meningococcal bacteria, [employer] recruits, people at risk during an outbreak, and people who travel to or live in countries with a high rate of meningitis should be immunized. A first-year college student up through age 39 years who is living in a residence hall should receive a dose if she did not receive a dose on or after her 16th birthday. Adults who have certain high-risk conditions should receive one or more doses of vaccine.  Hepatitis A vaccine. Adults who wish to be protected from this disease, have certain high-risk conditions, work with hepatitis A-infected animals, work in hepatitis A research labs, or travel to or work in countries with a high rate of hepatitis A should be immunized. Adults who were previously unvaccinated and who anticipate close contact with an international adoptee during the first 60 days after arrival in the [place] from a country with a high rate of hepatitis A should be immunized.  Hepatitis B vaccine. Adults who wish to be protected from this disease, have certain high-risk conditions, may be exposed to blood or other infectious body fluids, are household contacts or sex partners of hepatitis B positive people, are clients or workers in certain care facilities, or travel to or work in countries with a high rate of hepatitis B should be immunized.  Haemophilus influenzae type b (Hib) vaccine. A previously unvaccinated person with asplenia or sickle cell disease or having a scheduled splenectomy should receive 1 dose of Hib vaccine. Regardless of previous immunization, a recipient of a hematopoietic stem cell transplant should receive a 3-dose series 6-12 months  after her successful transplant. Hib vaccine is not recommended for adults with HIV infection. Preventive Services / Frequency Ages 53 to 76 years  Blood pressure check.** / Every 1 to 2 years.  Lipid and cholesterol check.** / Every 5 years beginning at age 7.  Clinical breast exam.** / Every 3 years for women in their 67s and 56s.  BRCA-related cancer risk assessment.** / For women who have family members with a BRCA-related cancer (breast, ovarian, tubal, or peritoneal cancers).  Pap test.** / Every 2 years from ages 58 through 49. Every 3 years starting at age 68 through age 26 or 53 with a history of 3 consecutive normal Pap tests.  HPV screening.** / Every 3 years from ages 91 through ages 63 to 38 with a history of 3 consecutive normal Pap tests.  Hepatitis C blood test.** / For any individual with known risks for hepatitis C.  Skin self-exam. / Monthly.  Influenza vaccine. / Every year.  Tetanus, diphtheria, and acellular pertussis (Tdap, Td) vaccine.** / Consult your health care provider. Pregnant women should receive 1 dose of Tdap vaccine during each pregnancy. 1 dose of Td every 10 years.  Varicella vaccine.** / Consult your health care provider. Pregnant females who do not have evidence of immunity should receive the first dose after pregnancy.  HPV vaccine. / 3 doses over 6 months, if 57 and younger. The vaccine is not recommended for use in pregnant females. However, pregnancy testing is not needed before receiving a dose.  Measles, mumps, rubella (MMR) vaccine.** / You need at least 1 dose of MMR if you were born in 1957 or later. You may also need a 2nd dose. For females of childbearing age, rubella immunity should be determined. If there is no evidence of immunity, females who are not pregnant should be vaccinated. If  there is no evidence of immunity, females who are pregnant should delay immunization until after pregnancy.  Pneumococcal 13-valent conjugate (PCV13)  vaccine.** / Consult your health care provider.  Pneumococcal polysaccharide (PPSV23) vaccine.** / 1 to 2 doses if you smoke cigarettes or if you have certain conditions.  Meningococcal vaccine.** / 1 dose if you are age 53 to 71 years and a Market researcher living in a residence hall, or have one of several medical conditions, you need to get vaccinated against meningococcal disease. You may also need additional booster doses.  Hepatitis A vaccine.** / Consult your health care provider.  Hepatitis B vaccine.** / Consult your health care provider.  Haemophilus influenzae type b (Hib) vaccine.** / Consult your health care provider. Ages 73 to 65 years  Blood pressure check.** / Every 1 to 2 years.  Lipid and cholesterol check.** / Every 5 years beginning at age 2 years.  Lung cancer screening. / Every year if you are aged 78-80 years and have a 30-pack-year history of smoking and currently smoke or have quit within the past 15 years. Yearly screening is stopped once you have quit smoking for at least 15 years or develop a health problem that would prevent you from having lung cancer treatment.  Clinical breast exam.** / Every year after age 51 years.  BRCA-related cancer risk assessment.** / For women who have family members with a BRCA-related cancer (breast, ovarian, tubal, or peritoneal cancers).  Mammogram.** / Every year beginning at age 34 years and continuing for as long as you are in good health. Consult with your health care provider.  Pap test.** / Every 3 years starting at age 36 years through age 35 or 35 years with a history of 3 consecutive normal Pap tests.  HPV screening.** / Every 3 years from ages 64 years through ages 70 to 15 years with a history of 3 consecutive normal Pap tests.  Fecal occult blood test (FOBT) of stool. / Every year beginning at age 65 years and continuing until age 28 years. You may not need to do this test if you get a colonoscopy every  10 years.  Flexible sigmoidoscopy or colonoscopy.** / Every 5 years for a flexible sigmoidoscopy or every 10 years for a colonoscopy beginning at age 64 years and continuing until age 62 years.  Hepatitis C blood test.** / For all people born from [birth year] through 1963 and any individual with known risks for hepatitis C.  Skin self-exam. / Monthly.  Influenza vaccine. / Every year.  Tetanus, diphtheria, and acellular pertussis (Tdap/Td) vaccine.** / Consult your health care provider. Pregnant women should receive 1 dose of Tdap vaccine during each pregnancy. 1 dose of Td every 10 years.  Varicella vaccine.** / Consult your health care provider. Pregnant females who do not have evidence of immunity should receive the first dose after pregnancy.  Zoster vaccine.** / 1 dose for adults aged 17 years or older.  Measles, mumps, rubella (MMR) vaccine.** / You need at least 1 dose of MMR if you were born in 1957 or later. You may also need a 2nd dose. For females of childbearing age, rubella immunity should be determined. If there is no evidence of immunity, females who are not pregnant should be vaccinated. If there is no evidence of immunity, females who are pregnant should delay immunization until after pregnancy.  Pneumococcal 13-valent conjugate (PCV13) vaccine.** / Consult your health care provider.  Pneumococcal polysaccharide (PPSV23) vaccine.** / 1 to 2 doses if you  smoke cigarettes or if you have certain conditions.  Meningococcal vaccine.** / Consult your health care provider.  Hepatitis A vaccine.** / Consult your health care provider.  Hepatitis B vaccine.** / Consult your health care provider.  Haemophilus influenzae type b (Hib) vaccine.** / Consult your health care provider. Ages 100 years and over  Blood pressure check.** / Every 1 to 2 years.  Lipid and cholesterol check.** / Every 5 years beginning at age 47 years.  Lung cancer screening. / Every year if you are aged 64-80  years and have a 30-pack-year history of smoking and currently smoke or have quit within the past 15 years. Yearly screening is stopped once you have quit smoking for at least 15 years or develop a health problem that would prevent you from having lung cancer treatment.  Clinical breast exam.** / Every year after age 56 years.  BRCA-related cancer risk assessment.** / For women who have family members with a BRCA-related cancer (breast, ovarian, tubal, or peritoneal cancers).  Mammogram.** / Every year beginning at age 17 years and continuing for as long as you are in good health. Consult with your health care provider.  Pap test.** / Every 3 years starting at age 20 years through age 63 or 24 years with 3 consecutive normal Pap tests. Testing can be stopped between 65 and 70 years with 3 consecutive normal Pap tests and no abnormal Pap or HPV tests in the past 10 years.  HPV screening.** / Every 3 years from ages 30 years through ages 40 or 67 years with a history of 3 consecutive normal Pap tests. Testing can be stopped between 65 and 70 years with 3 consecutive normal Pap tests and no abnormal Pap or HPV tests in the past 10 years.  Fecal occult blood test (FOBT) of stool. / Every year beginning at age 60 years and continuing until age 55 years. You may not need to do this test if you get a colonoscopy every 10 years.  Flexible sigmoidoscopy or colonoscopy.** / Every 5 years for a flexible sigmoidoscopy or every 10 years for a colonoscopy beginning at age 31 years and continuing until age 36 years.  Hepatitis C blood test.** / For all people born from [birth year] through 1963 and any individual with known risks for hepatitis C.  Osteoporosis screening.** / A one-time screening for women ages 27 years and over and women at risk for fractures or osteoporosis.  Skin self-exam. / Monthly.  Influenza vaccine. / Every year.  Tetanus, diphtheria, and acellular pertussis (Tdap/Td) vaccine.** / 1 dose of  Td every 10 years.  Varicella vaccine.** / Consult your health care provider.  Zoster vaccine.** / 1 dose for adults aged 67 years or older.  Pneumococcal 13-valent conjugate (PCV13) vaccine.** / Consult your health care provider.  Pneumococcal polysaccharide (PPSV23) vaccine.** / 1 dose for all adults aged 70 years and older.  Meningococcal vaccine.** / Consult your health care provider.  Hepatitis A vaccine.** / Consult your health care provider.  Hepatitis B vaccine.** / Consult your health care provider.  Haemophilus influenzae type b (Hib) vaccine.** / Consult your health care provider. ** Family history and personal history of risk and conditions may change your health care provider's recommendations. Document Released: 12/28/2001 Document Revised: 03/18/2014 Document Reviewed: 03/29/2011 [facility] Patient Information 2015 [place], [place]. This information is not intended to replace advice given to you by your health care provider. Make sure you discuss any questions you have with your health care provider.

## 2014-12-09 NOTE — Progress Notes (Signed)
Pre visit review using our clinic review tool, if applicable. No additional management support is needed unless otherwise documented below in the visit note. 

## 2014-12-10 ENCOUNTER — Ambulatory Visit: Payer: Medicare Other | Admitting: Family Medicine

## 2014-12-15 NOTE — Assessment & Plan Note (Signed)
Avoid offending foods, start probiotics. Do not eat large meals in late evening and consider raising head of bed.  

## 2014-12-15 NOTE — Assessment & Plan Note (Signed)
Improved on recheck. Well controlled, no changes to meds. Encouraged heart healthy diet such as the DASH diet and exercise as tolerated.  

## 2014-12-15 NOTE — Assessment & Plan Note (Signed)
Encouraged iron supplements daily and monitor with CBC, no active bleeding

## 2014-12-15 NOTE — Assessment & Plan Note (Signed)
Tolerating Coumadin, will start to monitor here. Regular rate today

## 2014-12-15 NOTE — Assessment & Plan Note (Signed)
Following with pulmonology and doing well.

## 2014-12-15 NOTE — Assessment & Plan Note (Signed)
Has stopped routine MGM surveillance, doing well

## 2014-12-15 NOTE — Progress Notes (Signed)
Yesenia Woods [id number] July 11, 1935 12/15/2014      Progress Note New Patient  Subjective  Chief Complaint  Chief Complaint  Patient presents with  . Establish Care    HPI  Patient is a 80 year old female in today for routine medical care. She has recently moved to [place] to be closer to her daughter. She follows with cardiology and pulmonology with [facility] and is looking to establish primary care here as well. No acute concerns today. No recent illness or complaints. Denies CP/palp/SOB/HA/congestion/fevers/GI or GU c/o. Taking meds as prescribed  Past Medical History  Diagnosis Date  . Myocardial infarct   . Cancer     breast  . Hypertension   . Anemia   . Headache(784.0)   . Arthritis   . Duodenal ulcer   . Small bowel arteriovenous malformation   . Hyperlipidemia   . COPD (chronic obstructive pulmonary disease)   . Diverticulitis   . Atrial fibrillation   . Breast cancer 12/09/2014    Diagnosed 41, s/p mastectomy on left, chemo x 3 doses    Past Surgical History  Procedure Laterality Date  . Coronary artery bypass graft    . Eye surgery    . Breast surgery      Left mastectomy  . Right hand surgery    . Esophagogastroduodenoscopy  07/22/2011    Procedure: ESOPHAGOGASTRODUODENOSCOPY (EGD);  Surgeon: Rogene Houston, MD;  Location: [facility];  Service: Endoscopy;  Laterality: N/A;  . Colonoscopy  07/22/2011    Procedure: COLONOSCOPY;  Surgeon: Rogene Houston, MD;  [facility]: [facility];  Service: Endoscopy;  Laterality: N/A;  . Givens capsule study  07/30/2011    Procedure: GIVENS CAPSULE STUDY;  Surgeon: Rogene Houston, MD;  Location: [facility];  Service: Endoscopy;  Laterality: N/A;  7:30  . Esophagogastroduodenoscopy N/A 04/23/2013    Procedure: ESOPHAGOGASTRODUODENOSCOPY (EGD);  Surgeon: Rogene Houston, MD;  Location: [facility];  Service: Endoscopy;  Laterality: N/A;    Family History  Problem Relation Age of Onset  . Prostate cancer Brother   .  Prostate cancer Brother   . Breast cancer Daughter     Breast Cancer that mets  . Cancer Daughter     thrpat  . Healthy Daughter   . Healthy Son   . Diabetes Son   . Hypertension Son   . Healthy Son   . Healthy Son   . Healthy Son   . Heart disease Son     cardiac arrythmia, with defibrillator  . Healthy Son   . Cancer Son     prostate  . CAD Son     History   Social History  . Marital Status: Widowed    Spouse Name: N/A    Number of Children: N/A  . Years of Education: N/A   Occupational History  . Not on file.   Social History Main Topics  . Smoking status: Former Smoker -- 1.00 packs/day for 30 years    Quit date: 10/14/1994  . Smokeless tobacco: Never Used     Comment: Patient quit smoking 25 years ago  . Alcohol Use: No     Comment: none for 8 months, wine occasionally  . Drug Use: No  . 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    Other Topics Concern  . Not on file   Social History Narrative    Current Outpatient Prescriptions on  File Prior to Visit  Medication Sig Dispense Refill  . acetaminophen (TYLENOL) 500 MG tablet Take 1,000 mg by mouth daily as needed for moderate pain.    Marland Kitchen BREO ELLIPTA 100-25 MCG/INH AEPB Inhale 1 puff into the lungs daily.     . ferrous sulfate 325 (65 FE) MG tablet Take 325 mg by mouth daily with breakfast.    . furosemide (LASIX) 40 MG tablet Take 1 tablet (40 mg total) by mouth daily as needed for fluid or edema. 30 tablet 0  . ipratropium-albuterol (DUONEB) 0.5-2.5 (3) MG/3ML SOLN Take 3 mLs by nebulization.    . isosorbide mononitrate (IMDUR) 30 MG 24 hr tablet Take 30 mg by mouth 2 (two) times daily.     . metoprolol (LOPRESSOR) 50 MG tablet Take 25 mg by mouth 2 (two) times daily.     Marland Kitchen omeprazole (PRILOSEC) 20 MG capsule Take 20 mg by mouth 2 (two) times daily before a meal. 1/2 pill atwice a day.    Marland Kitchen PROAIR HFA 108 (90 BASE) MCG/ACT inhaler Inhale 2  puffs into the lungs every 6 (six) hours as needed.     . warfarin (COUMADIN) 4 MG tablet Take 4 mg by mouth daily. 4 mg on Monday's and 7.5 mg on all other days.    Marland Kitchen warfarin (COUMADIN) 7.5 MG tablet Take 1 tablet by mouth daily. 7.5 mg daily except on Monday's, take 4 mg.     No current facility-administered medications on file prior to visit.    No Known Allergies  Review of Systems  Review of Systems  Constitutional: Negative for fever, chills and malaise/fatigue.  HENT: Negative for congestion, hearing loss and nosebleeds.   Eyes: Negative for discharge.  Respiratory: Negative for cough, sputum production, shortness of breath and wheezing.   Cardiovascular: Negative for chest pain, palpitations and leg swelling.  Gastrointestinal: Negative for heartburn, nausea, vomiting, abdominal pain, diarrhea, constipation and blood in stool.  Genitourinary: Negative for dysuria, urgency, frequency and hematuria.  Musculoskeletal: Negative for myalgias, back pain and falls.  Skin: Negative for rash.  Neurological: Negative for dizziness, tremors, sensory change, focal weakness, loss of consciousness, weakness and headaches.  Endo/Heme/Allergies: Negative for polydipsia. Does not bruise/bleed easily.  Psychiatric/Behavioral: Negative for depression and suicidal ideas. The patient is not nervous/anxious and does not have insomnia.     Objective  BP 130/76 mmHg  Pulse 64  Temp(Src) 98 F (36.7 C) (Oral)  Ht 5' (1.524 m)  Wt 142 lb 9.6 oz (64.683 kg)  BMI 27.85 kg/m2  SpO2 98%  Physical Exam  Physical Exam  Constitutional: She is oriented to person, place, and time and well-developed, well-nourished, and in no distress. No distress.  HENT:  Head: Normocephalic and atraumatic.  Right Ear: External ear normal.  Left Ear: External ear normal.  Nose: Nose normal.  Mouth/Throat: Oropharynx is clear and moist. No oropharyngeal exudate.  Eyes: Conjunctivae are normal. Pupils are equal,  round, and reactive to light. Right eye exhibits no discharge. Left eye exhibits no discharge. No scleral icterus.  Neck: Normal range of motion. Neck supple. No thyromegaly present.  Cardiovascular: Normal rate, normal heart sounds and intact distal pulses.   No murmur heard. Irregularly irregular  Pulmonary/Chest: Effort normal and breath sounds normal. No respiratory distress. She has no wheezes. She has no rales.  Abdominal: Soft. Bowel sounds are normal. She exhibits no distension and no mass. There is no tenderness.  Musculoskeletal: Normal range of motion. She exhibits no edema or  tenderness.  Lymphadenopathy:    She has no cervical adenopathy.  Neurological: She is alert and oriented to person, place, and time. She has normal reflexes. No cranial nerve deficit. Coordination normal.  Skin: Skin is warm and dry. No rash noted. She is not diaphoretic.  Psychiatric: Mood, memory and affect normal.       Assessment & Plan  HTN (hypertension) Improved on recheck. Well controlled, no changes to meds. Encouraged heart healthy diet such as the DASH diet and exercise as tolerated.    Atrial fibrillation Tolerating Coumadin, will start to monitor here. Regular rate today   Pulmonary hypertension Following with pulmonology and doing well.   GERD (gastroesophageal reflux disease) Avoid offending foods, start probiotics. Do not eat large meals in late evening and consider raising head of bed.    Iron deficiency anemia Encouraged iron supplements daily and monitor with CBC, no active bleeding   Breast cancer Has stopped routine MGM surveillance, doing well

## 2014-12-17 ENCOUNTER — Ambulatory Visit (INDEPENDENT_AMBULATORY_CARE_PROVIDER_SITE_OTHER): Payer: Medicare Other | Admitting: *Deleted

## 2014-12-17 VITALS — BP 185/81 | HR 58 | Temp 98.0°F | Resp 16 | Wt 140.4 lb

## 2014-12-17 DIAGNOSIS — I482 Chronic atrial fibrillation, unspecified: Secondary | ICD-10-CM

## 2014-12-17 LAB — POCT INR: INR: 1.9

## 2014-12-17 MED ORDER — WARFARIN SODIUM 4 MG PO TABS: 4.0000 mg | ORAL_TABLET | Freq: Every day | ORAL | Status: DC

## 2014-12-17 MED ORDER — WARFARIN SODIUM 7.5 MG PO TABS: 7.5000 mg | ORAL_TABLET | Freq: Every day | ORAL | Status: DC

## 2014-12-17 NOTE — Progress Notes (Signed)
Pre visit review using our clinic review tool, if applicable. No additional management support is needed unless otherwise documented below in the visit note. 

## 2014-12-17 NOTE — Patient Instructions (Signed)
Continue taking 7.5 mg daily except 4mg  on Mondays.  Recheck in 4 weeks.

## 2014-12-17 NOTE — Addendum Note (Signed)
Addended by: Leticia Penna A on: 12/17/2014 11:27 AM   Modules accepted: Orders

## 2014-12-31 ENCOUNTER — Ambulatory Visit (INDEPENDENT_AMBULATORY_CARE_PROVIDER_SITE_OTHER): Payer: Medicare Other | Admitting: Internal Medicine

## 2015-01-14 ENCOUNTER — Ambulatory Visit (INDEPENDENT_AMBULATORY_CARE_PROVIDER_SITE_OTHER): Payer: Medicare Other | Admitting: *Deleted

## 2015-01-14 VITALS — BP 165/75 | HR 66 | Temp 97.5°F | Resp 20 | Wt 140.2 lb

## 2015-01-14 DIAGNOSIS — I482 Chronic atrial fibrillation, unspecified: Secondary | ICD-10-CM

## 2015-01-14 DIAGNOSIS — Z7901 Long term (current) use of anticoagulants: Secondary | ICD-10-CM

## 2015-01-14 LAB — POCT INR: INR: 1.7

## 2015-01-14 NOTE — Patient Instructions (Signed)
Continue taking 7.5 mg (one 7.5mg  pill) daily except 6mg  (1 and 1/2 pills) on Mondays.  Recheck in 2 weeks.

## 2015-01-14 NOTE — Progress Notes (Signed)
Pre visit review using our clinic review tool, if applicable. No additional management support is needed unless otherwise documented below in the visit note. 

## 2015-01-16 ENCOUNTER — Telehealth: Payer: Self-pay | Admitting: *Deleted

## 2015-01-16 NOTE — Telephone Encounter (Signed)
Patient dropped off DMV handicap placard application. Forms forwarded to Dr. Charlett Blake. JG//CMA

## 2015-01-21 NOTE — Telephone Encounter (Signed)
Forms completed by Dr. Charlett Blake. Attempted to call pt but it rang multiple times, no answer, no opportunity to leave voicemail. Originals mailed to pt and copy sent for scanning. JG//CMA

## 2015-02-14 ENCOUNTER — Telehealth: Payer: Self-pay | Admitting: *Deleted

## 2015-02-14 ENCOUNTER — Ambulatory Visit (INDEPENDENT_AMBULATORY_CARE_PROVIDER_SITE_OTHER): Payer: Medicare Other | Admitting: *Deleted

## 2015-02-14 VITALS — BP 147/61 | HR 62 | Temp 98.1°F | Resp 16 | Wt 140.8 lb

## 2015-02-14 DIAGNOSIS — I482 Chronic atrial fibrillation, unspecified: Secondary | ICD-10-CM

## 2015-02-14 LAB — POCT INR: INR: 2.1

## 2015-02-14 MED ORDER — OMEPRAZOLE 20 MG PO CPDR: 20.0000 mg | DELAYED_RELEASE_CAPSULE | Freq: Two times a day (BID) | ORAL | Status: DC

## 2015-02-14 NOTE — Patient Instructions (Addendum)
Continue to take the 6mg  daily.  Return in 2 weeks.

## 2015-02-14 NOTE — Telephone Encounter (Signed)
Rx sent with instructions to take 1/2 tab twice daily.  Patient notified.

## 2015-02-14 NOTE — Telephone Encounter (Signed)
Patient requesting refill of Omeprazole.  Rx from [facility] and signature states:  omeprazole (PRILOSEC) 20 MG capsule        Sig - Route: Take 20 mg by mouth 2 (two) times daily before a meal. 1/2 pill atwice a day. - Oral   Should she be taking 20mg  BID or 10mg  BID?   OK to refill?   Please advise

## 2015-02-14 NOTE — Progress Notes (Signed)
Pre visit review using our clinic review tool, if applicable. No additional management support is needed unless otherwise documented below in the visit note. 

## 2015-02-14 NOTE — Telephone Encounter (Signed)
I would start w/ 1/2 pill (10 mg) twice daily and if still having symptoms of heartburn/reflux, can increase to 1 tab twice daily.

## 2015-02-17 ENCOUNTER — Other Ambulatory Visit: Payer: Self-pay | Admitting: *Deleted

## 2015-02-17 ENCOUNTER — Ambulatory Visit (INDEPENDENT_AMBULATORY_CARE_PROVIDER_SITE_OTHER): Payer: Medicare Other | Admitting: Internal Medicine

## 2015-02-17 VITALS — BP 132/80 | HR 68 | Temp 98.0°F | Resp 16 | Ht 60.0 in | Wt 141.8 lb

## 2015-02-17 DIAGNOSIS — I2510 Atherosclerotic heart disease of native coronary artery without angina pectoris: Secondary | ICD-10-CM

## 2015-02-17 DIAGNOSIS — Z8719 Personal history of other diseases of the digestive system: Secondary | ICD-10-CM | POA: Diagnosis not present

## 2015-02-17 DIAGNOSIS — D509 Iron deficiency anemia, unspecified: Secondary | ICD-10-CM

## 2015-02-17 MED ORDER — WARFARIN SODIUM 4 MG PO TABS: 4.0000 mg | ORAL_TABLET | Freq: Every day | ORAL | Status: DC

## 2015-02-17 NOTE — Progress Notes (Signed)
Patient notified of results.  Coumadin check previously scheduled.

## 2015-02-18 ENCOUNTER — Other Ambulatory Visit (HOSPITAL_COMMUNITY)
Admission: RE | Admit: 2015-02-18 | Discharge: 2015-02-18 | Disposition: A | Discharge status: Home / Self Care | Payer: Medicare Other | Source: Ambulatory Visit | Attending: Internal Medicine | Admitting: Internal Medicine

## 2015-02-18 ENCOUNTER — Encounter (INDEPENDENT_AMBULATORY_CARE_PROVIDER_SITE_OTHER): Payer: Self-pay | Admitting: Internal Medicine

## 2015-02-18 DIAGNOSIS — K922 Gastrointestinal hemorrhage, unspecified: Secondary | ICD-10-CM | POA: Insufficient documentation

## 2015-02-18 DIAGNOSIS — D649 Anemia, unspecified: Secondary | ICD-10-CM | POA: Insufficient documentation

## 2015-02-18 LAB — BASIC METABOLIC PANEL
ANION GAP: 8 (ref 5–15)
BUN: 11 mg/dL (ref 6–23)
CALCIUM: 9.4 mg/dL (ref 8.4–10.5)
CO2: 24 mmol/L (ref 19–32)
Chloride: 108 mmol/L (ref 96–112)
Creatinine, Ser: 0.86 mg/dL (ref 0.50–1.10)
GFR calc Af Amer: 73 mL/min — ABNORMAL LOW (ref >90)
GFR, EST NON AFRICAN AMERICAN: 63 mL/min — AB (ref >90)
GLUCOSE: 81 mg/dL (ref 70–99)
POTASSIUM: 4.5 mmol/L (ref 3.5–5.1)
SODIUM: 140 mmol/L (ref 135–145)

## 2015-02-18 LAB — CBC
HEMATOCRIT: 32.1 % — AB (ref 36.0–46.0)
Hemoglobin: 10 g/dL — ABNORMAL LOW (ref 12.0–15.0)
MCH: 21.9 pg — AB (ref 26.0–34.0)
MCHC: 31.2 g/dL (ref 30.0–36.0)
MCV: 70.2 fL — ABNORMAL LOW (ref 78.0–100.0)
Platelets: 328 10*3/uL (ref 150–400)
RBC: 4.57 MIL/uL (ref 3.87–5.11)
RDW: 22.2 % — AB (ref 11.5–15.5)
WBC: 6.7 10*3/uL (ref 4.0–10.5)

## 2015-02-18 NOTE — Progress Notes (Signed)
Presenting complaint;  Follow-up for iron deficiency anemia.  Subjective:  Patient is 80 year old African female who is here for scheduled visit. She was last seen about 4 months ago. She is living with her daughter in [place] but she states her daughter works in [place] and she lives there for Monday through Friday and is home for 2 days. She states her daughter is planning to move to [place] next year. She is not sure what she will do without her. She has been to [place] twice since her last visit to visit her son. She has some of her children live in [place] and she is planning to stay with him tonight. She continues to complain of feeling weak and tired. She does not feel that has gotten worse. She denies rectal bleeding. Stool is always black because she is on iron. He generally has 1 formed stool daily. She denies nausea vomiting or abdominal pain. She does not take OTC NSAIDs. Her appetite is up and down. She states she ate well when she was in [place]. She uses nasal O2 and she still driving.   Current Medications: Outpatient Encounter Prescriptions as of 02/17/2015  Medication Sig  . acetaminophen (TYLENOL) 500 MG tablet Take 1,000 mg by mouth daily as needed for moderate pain.  Marland Kitchen BREO ELLIPTA 100-25 MCG/INH AEPB Inhale 1 puff into the lungs daily.   . ferrous sulfate 325 (65 FE) MG tablet Take 325 mg by mouth daily with breakfast.  . furosemide (LASIX) 40 MG tablet Take 1 tablet (40 mg total) by mouth daily as needed for fluid or edema.  Marland Kitchen ipratropium-albuterol (DUONEB) 0.5-2.5 (3) MG/3ML SOLN Take 3 mLs by nebulization.  . isosorbide mononitrate (IMDUR) 30 MG 24 hr tablet Take 30 mg by mouth 2 (two) times daily.   . metoprolol (LOPRESSOR) 50 MG tablet Take 25 mg by mouth 2 (two) times daily.   Marland Kitchen PROAIR HFA 108 (90 BASE) MCG/ACT inhaler Inhale 2 puffs into the lungs every 6 (six) hours as needed.   . warfarin (COUMADIN) 4 MG tablet Take 1 tablet (4 mg total) by mouth  daily. Take as directed.  . [DISCONTINUED] omeprazole (PRILOSEC) 20 MG capsule Take 1 capsule (20 mg total) by mouth 2 (two) times daily before a meal. 1/2 pill atwice a day. (Patient not taking: Reported on 02/18/2015)  . [DISCONTINUED] warfarin (COUMADIN) 7.5 MG tablet Take 1 tablet (7.5 mg total) by mouth daily. 7.5 mg daily except on Monday's, take 4 mg. (Patient not taking: Reported on 02/18/2015)     Objective: Blood pressure 132/80, pulse 68, temperature 98 F (36.7 C), temperature source Oral, resp. rate 16, height 5' (1.524 m), weight 141 lb 12.8 oz (64.32 kg). Patient is alert and in no acute distress. Conjunctiva is pink. Sclera is nonicteric Oropharyngeal mucosa is normal. No neck masses or thyromegaly noted. Cardiac exam with irregular rhythm normal S1 and S2. No murmur or gallop noted. Lungs are clear to auscultation. Abdomen is symmetrical soft and nontender without organomegaly or masses.  No LE edema or clubbing noted.  Labs/studies Results: H&H was 9.1 and 28.0 on 10/16/2014    Assessment:  #1. Iron deficiency anemia secondary to chronic/intermittent bleed from small bowel angiodysplasia. She also has history of duodenal ulcer but has been documented to have healed. She has chronic fatigue which is multifactorial but need to make sure that her H&H has not dropped.  Plan:  Patient will go to the lab for CBC and type and screen just in  case. Office visit in 4 months.

## 2015-02-19 ENCOUNTER — Telehealth (INDEPENDENT_AMBULATORY_CARE_PROVIDER_SITE_OTHER): Payer: Self-pay | Admitting: *Deleted

## 2015-02-19 DIAGNOSIS — D509 Iron deficiency anemia, unspecified: Secondary | ICD-10-CM

## 2015-02-19 NOTE — Telephone Encounter (Signed)
Per Dr.Rehman the patient will need to have labs drawn in 1 month. 

## 2015-02-20 ENCOUNTER — Telehealth: Payer: Self-pay | Admitting: Pulmonary Disease

## 2015-02-20 NOTE — Telephone Encounter (Signed)
She sees me in [place] OK to wait if no openings

## 2015-02-20 NOTE — Telephone Encounter (Signed)
Pt scheduled for OV with RA 04/09/15 at 9:30 Pt offered appt with TP in April but refused - pt was supposed to follow up in April per last OV. All days that Dr Elsworth Soho is in office, he is 100% booked and double booked in some places.  Please advise Dr Elsworth Soho if you are okay with the patient being seen this far out. Thanks.

## 2015-02-21 NOTE — Telephone Encounter (Signed)
Pt aware that appt is okay per RA Nothing further needed.

## 2015-02-28 ENCOUNTER — Ambulatory Visit (INDEPENDENT_AMBULATORY_CARE_PROVIDER_SITE_OTHER): Payer: Medicare Other | Admitting: *Deleted

## 2015-02-28 ENCOUNTER — Telehealth: Payer: Self-pay | Admitting: *Deleted

## 2015-02-28 VITALS — BP 170/85 | HR 57 | Temp 97.8°F | Resp 16 | Wt 137.8 lb

## 2015-02-28 DIAGNOSIS — I482 Chronic atrial fibrillation, unspecified: Secondary | ICD-10-CM

## 2015-02-28 LAB — POCT INR: INR: 4.6

## 2015-02-28 NOTE — Telephone Encounter (Signed)
Notified patient and she stated understanding.  

## 2015-02-28 NOTE — Patient Instructions (Signed)
Hold Coumadin tonight, take 4mg  (1 tablet) Sat and Sun.  Then, continue the 6mg  tablets daily.  Recheck in 2 weeks.

## 2015-02-28 NOTE — Progress Notes (Signed)
Pre visit review using our clinic review tool, if applicable. No additional management support is needed unless otherwise documented below in the visit note. 

## 2015-02-28 NOTE — Telephone Encounter (Signed)
Patient came in to INR check today and BP was high 170/85- she states she is having a lot of stress, she thinks her BP is like this all the time.  She sees a cardiologist, Dr. Stanford Breed, but does not have appointment with him until June (she has a f/u here 4/28).  Should she do anything in the meantime?    Notified patient of symptoms/ when to call a Dr./ go to ED- ie headache, sob, chest pain.  Patient denies any of those symptoms at this time.   Please advise.

## 2015-02-28 NOTE — Telephone Encounter (Signed)
Have her increase her Metoprolol 25 mg tab to 1 tab po tid

## 2015-03-10 ENCOUNTER — Ambulatory Visit: Payer: Medicare Other | Admitting: Family Medicine

## 2015-03-12 ENCOUNTER — Other Ambulatory Visit (INDEPENDENT_AMBULATORY_CARE_PROVIDER_SITE_OTHER): Payer: Self-pay | Admitting: *Deleted

## 2015-03-12 ENCOUNTER — Encounter (INDEPENDENT_AMBULATORY_CARE_PROVIDER_SITE_OTHER): Payer: Self-pay | Admitting: *Deleted

## 2015-03-12 DIAGNOSIS — D509 Iron deficiency anemia, unspecified: Secondary | ICD-10-CM

## 2015-03-13 ENCOUNTER — Encounter: Payer: Self-pay | Admitting: Family Medicine

## 2015-03-13 ENCOUNTER — Ambulatory Visit (INDEPENDENT_AMBULATORY_CARE_PROVIDER_SITE_OTHER): Payer: Medicare Other | Admitting: Family Medicine

## 2015-03-13 VITALS — BP 146/80 | HR 59 | Temp 98.0°F | Resp 18 | Ht 60.0 in | Wt 136.0 lb

## 2015-03-13 DIAGNOSIS — I482 Chronic atrial fibrillation, unspecified: Secondary | ICD-10-CM

## 2015-03-13 DIAGNOSIS — K219 Gastro-esophageal reflux disease without esophagitis: Secondary | ICD-10-CM

## 2015-03-13 DIAGNOSIS — Z7901 Long term (current) use of anticoagulants: Secondary | ICD-10-CM

## 2015-03-13 DIAGNOSIS — D500 Iron deficiency anemia secondary to blood loss (chronic): Secondary | ICD-10-CM

## 2015-03-13 DIAGNOSIS — E782 Mixed hyperlipidemia: Secondary | ICD-10-CM | POA: Diagnosis not present

## 2015-03-13 DIAGNOSIS — I10 Essential (primary) hypertension: Secondary | ICD-10-CM

## 2015-03-13 DIAGNOSIS — I2510 Atherosclerotic heart disease of native coronary artery without angina pectoris: Secondary | ICD-10-CM

## 2015-03-13 DIAGNOSIS — E785 Hyperlipidemia, unspecified: Secondary | ICD-10-CM

## 2015-03-13 LAB — COMPREHENSIVE METABOLIC PANEL
ALBUMIN: 3.9 g/dL (ref 3.5–5.2)
ALT: 8 U/L (ref 0–35)
AST: 13 U/L (ref 0–37)
Alkaline Phosphatase: 83 U/L (ref 39–117)
BUN: 16 mg/dL (ref 6–23)
CALCIUM: 10.2 mg/dL (ref 8.4–10.5)
CHLORIDE: 106 meq/L (ref 96–112)
CO2: 28 meq/L (ref 19–32)
Creatinine, Ser: 0.83 mg/dL (ref 0.40–1.20)
GFR: 85.48 mL/min (ref >60.00)
GLUCOSE: 88 mg/dL (ref 70–99)
POTASSIUM: 4.3 meq/L (ref 3.5–5.1)
SODIUM: 141 meq/L (ref 135–145)
Total Bilirubin: 0.6 mg/dL (ref 0.2–1.2)
Total Protein: 7 g/dL (ref 6.0–8.3)

## 2015-03-13 LAB — CBC
HCT: 33.4 % — ABNORMAL LOW (ref 36.0–46.0)
HEMOGLOBIN: 10.4 g/dL — AB (ref 12.0–15.0)
MCHC: 31.1 g/dL (ref 30.0–36.0)
MCV: 69.5 fl — ABNORMAL LOW (ref 78.0–100.0)
Platelets: 469 10*3/uL — ABNORMAL HIGH (ref 150.0–400.0)
RBC: 4.8 Mil/uL (ref 3.87–5.11)
RDW: 23.6 % — ABNORMAL HIGH (ref 11.5–15.5)
WBC: 6.4 10*3/uL (ref 4.0–10.5)

## 2015-03-13 LAB — LIPID PANEL
Cholesterol: 143 mg/dL (ref 0–200)
HDL: 51.8 mg/dL (ref >39.00)
LDL Cholesterol: 78 mg/dL (ref 0–99)
NONHDL: 91.2
Total CHOL/HDL Ratio: 3
Triglycerides: 68 mg/dL (ref 0.0–149.0)
VLDL: 13.6 mg/dL (ref 0.0–40.0)

## 2015-03-13 LAB — PROTIME-INR
INR: 2.1 ratio — AB (ref 0.8–1.0)
Prothrombin Time: 23 s — ABNORMAL HIGH (ref 9.6–13.1)

## 2015-03-13 LAB — TSH: TSH: 0.86 u[IU]/mL (ref 0.35–4.50)

## 2015-03-13 MED ORDER — METOPROLOL TARTRATE 50 MG PO TABS: 50.0000 mg | ORAL_TABLET | Freq: Two times a day (BID) | ORAL | Status: DC

## 2015-03-13 MED ORDER — NITROGLYCERIN 0.4 MG SL SUBL: 0.4000 mg | SUBLINGUAL_TABLET | SUBLINGUAL | Status: AC | PRN

## 2015-03-13 NOTE — Assessment & Plan Note (Signed)
Poorly controlled will not alter medications, encouraged DASH diet, minimize caffeine and obtain adequate sleep. Report concerning symptoms and follow up as directed and as needed. But did not take meds today

## 2015-03-13 NOTE — Patient Instructions (Signed)
Anemia, Nonspecific Anemia is a condition in which the concentration of red blood cells or hemoglobin in the blood is below normal. Hemoglobin is a substance in red blood cells that carries oxygen to the tissues of the body. Anemia results in not enough oxygen reaching these tissues.  CAUSES  Common causes of anemia include:   Excessive bleeding. Bleeding may be internal or external. This includes excessive bleeding from periods (in women) or from the intestine.   Poor nutrition.   Chronic kidney, thyroid, and liver disease.  Bone marrow disorders that decrease red blood cell production.  Cancer and treatments for cancer.  HIV, AIDS, and their treatments.  Spleen problems that increase red blood cell destruction.  Blood disorders.  Excess destruction of red blood cells due to infection, medicines, and autoimmune disorders. SIGNS AND SYMPTOMS   Minor weakness.   Dizziness.   Headache.  Palpitations.   Shortness of breath, especially with exercise.   Paleness.  Cold sensitivity.  Indigestion.  Nausea.  Difficulty sleeping.  Difficulty concentrating. Symptoms may occur suddenly or they may develop slowly.  DIAGNOSIS  Additional blood tests are often needed. These help your health care provider determine the best treatment. Your health care provider will check your stool for blood and look for other causes of blood loss.  TREATMENT  Treatment varies depending on the cause of the anemia. Treatment can include:   Supplements of iron, vitamin B12, or folic acid.   Hormone medicines.   A blood transfusion. This may be needed if blood loss is severe.   Hospitalization. This may be needed if there is significant continual blood loss.   Dietary changes.  Spleen removal. HOME CARE INSTRUCTIONS Keep all follow-up appointments. It often takes many weeks to correct anemia, and having your health care provider check on your condition and your response to  treatment is very important. SEEK IMMEDIATE MEDICAL CARE IF:   You develop extreme weakness, shortness of breath, or chest pain.   You become dizzy or have trouble concentrating.  You develop heavy vaginal bleeding.   You develop a rash.   You have bloody or black, tarry stools.   You faint.   You vomit up blood.   You vomit repeatedly.   You have abdominal pain.  You have a fever or persistent symptoms for more than 2-3 days.   You have a fever and your symptoms suddenly get worse.   You are dehydrated.  MAKE SURE YOU:  Understand these instructions.  Will watch your condition.  Will get help right away if you are not doing well or get worse. Document Released: 12/09/2004 Document Revised: 07/04/2013 Document Reviewed: 04/27/2013 [employer] Patient Information 2015 [employer]. This information is not intended to replace advice given to you by your health care provider. Make sure you discuss any questions you have with your health care provider.  

## 2015-03-13 NOTE — Progress Notes (Signed)
Pre visit review using our clinic review tool, if applicable. No additional management support is needed unless otherwise documented below in the visit note. 

## 2015-03-13 NOTE — Progress Notes (Signed)
Yesenia Woods  [id number] Feb 23, 1935 03/13/2015      Progress Note-Follow Up  Subjective  Chief Complaint  Chief Complaint  Patient presents with  . Follow-up    3 mo. Pt declined her bone density and her PNV 13.     HPI  Patient is a 80 y.o. female in today for routine medical care. Patient is in today for follow-up on numerous medical conditions. She generally feels well, although she does note having occasional sharp fleeting chest pains, especially when she is upset with her family. She denies shortness of breath or palpitations. She denies diaphoresis or nausea. When these episodes occur. She does have intermittent trouble with some hot sweats at night. This is unusual. No recent illness. Denies CP/palp/SOB/HA/congestion/fevers or GU c/o. Taking meds as prescribed  Past Medical History  Diagnosis Date  . Myocardial infarct   . Cancer     breast  . Hypertension   . Anemia   . Headache(784.0)   . Arthritis   . Duodenal ulcer   . Small bowel arteriovenous malformation   . Hyperlipidemia   . COPD (chronic obstructive pulmonary disease)   . Diverticulitis   . Atrial fibrillation   . Breast cancer 12/09/2014    Diagnosed 60, s/p mastectomy on left, chemo x 3 doses    Past Surgical History  Procedure Laterality Date  . Coronary artery bypass graft    . Eye surgery    . Breast surgery      Left mastectomy  . Right hand surgery    . Esophagogastroduodenoscopy  07/22/2011    Procedure: ESOPHAGOGASTRODUODENOSCOPY (EGD);  Surgeon: Rogene Houston, MD;  [facility];  Service: Endoscopy;  Laterality: N/A;  . Colonoscopy  07/22/2011    Procedure: COLONOSCOPY;  Surgeon: Rogene Houston, MD;  [facility];  Service: Endoscopy;  Laterality: N/A;  . Givens capsule study  07/30/2011    Procedure: GIVENS CAPSULE STUDY;  Surgeon: Rogene Houston, MD;  [facility];  Service: Endoscopy;  Laterality: N/A;  7:30  . Esophagogastroduodenoscopy N/A  04/23/2013    Procedure: ESOPHAGOGASTRODUODENOSCOPY (EGD);  Surgeon: Rogene Houston, MD;  [facility];  Service: Endoscopy;  Laterality: N/A;    Family History  Problem Relation Age of Onset  . Prostate cancer Brother   . Prostate cancer Brother   . Breast cancer Daughter     Breast Cancer that mets  . Cancer Daughter     thrpat  . Healthy Daughter   . Healthy Son   . Diabetes Son   . Hypertension Son   . Healthy Son   . Healthy Son   . Healthy Son   . Heart disease Son     cardiac arrythmia, with defibrillator  . Healthy Son   . Cancer Son     prostate  . CAD Son     History   Social History  . Marital Status: Widowed    Spouse Name: N/A  . Number of Children: N/A  . Years of Education: N/A   Occupational History  . Not on file.   Social History Main Topics  . Smoking status: Former Smoker -- 1.00 packs/day for 30 years    Quit date: 10/14/1994  . Smokeless tobacco: Never Used     Comment: Patient quit smoking 25 years ago  . Alcohol Use: No     Comment: none for 8 months, wine occasionally  . Drug Use: No  . 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 no dietary restrictions.    Other Topics Concern  . Not on file   Social History Narrative    Current Outpatient Prescriptions on File Prior to Visit  Medication Sig Dispense Refill  . acetaminophen (TYLENOL) 500 MG tablet Take 1,000 mg by mouth daily as needed for moderate pain.    Marland Kitchen BREO ELLIPTA 100-25 MCG/INH AEPB Inhale 1 puff into the lungs daily.     . ferrous sulfate 325 (65 FE) MG tablet Take 325 mg by mouth daily with breakfast.    . furosemide (LASIX) 40 MG tablet Take 1 tablet (40 mg total) by mouth daily as needed for fluid or edema. 30 tablet 0  . ipratropium-albuterol (DUONEB) 0.5-2.5 (3) MG/3ML SOLN Take 3 mLs by nebulization.    . isosorbide mononitrate (IMDUR) 30 MG 24 hr tablet Take 30 mg by mouth 2 (two) times daily.     Marland Kitchen omeprazole  (PRILOSEC) 20 MG capsule Take 1 capsule by mouth daily.    Marland Kitchen PROAIR HFA 108 (90 BASE) MCG/ACT inhaler Inhale 2 puffs into the lungs every 6 (six) hours as needed.     . warfarin (COUMADIN) 4 MG tablet Take 1 tablet (4 mg total) by mouth daily. Take as directed. 30 tablet 2   No current facility-administered medications on file prior to visit.    No Known Allergies  Review of Systems  Review of Systems  Constitutional: Negative for fever and malaise/fatigue.  HENT: Negative for congestion.   Eyes: Negative for discharge.  Respiratory: Negative for shortness of breath.   Cardiovascular: Positive for chest pain. Negative for palpitations and leg swelling.       Notes occasional sharp, fleeting chest pains when she is upset with her family, no other associated symptoms  Gastrointestinal: Positive for heartburn. Negative for nausea, abdominal pain and diarrhea.  Genitourinary: Negative for dysuria.  Musculoskeletal: Negative for falls.  Skin: Negative for rash.  Neurological: Negative for loss of consciousness and headaches.  Endo/Heme/Allergies: Negative for polydipsia.  Psychiatric/Behavioral: Negative for depression and suicidal ideas. The patient is not nervous/anxious and does not have insomnia.     Objective  BP 146/80 mmHg  Pulse 59  Temp(Src) 98 F (36.7 C) (Oral)  Resp 18  Ht 5' (1.524 m)  Wt 136 lb (61.689 kg)  BMI 26.56 kg/m2  SpO2 97%  Physical Exam  Physical Exam  Constitutional: She is oriented to person, place, and time and well-developed, well-nourished, and in no distress. No distress.  HENT:  Head: Normocephalic and atraumatic.  Eyes: Conjunctivae are normal.  Neck: Neck supple. No thyromegaly present.  Cardiovascular: Normal rate and normal heart sounds.   No murmur heard. irregular  Pulmonary/Chest: Effort normal and breath sounds normal. She has no wheezes.  Abdominal: She exhibits no distension and no mass.  Musculoskeletal: She exhibits no edema.    Lymphadenopathy:    She has no cervical adenopathy.  Neurological: She is alert and oriented to person, place, and time.  Skin: Skin is warm and dry. No rash noted. She is not diaphoretic.  Psychiatric: Memory, affect and judgment normal.    Lab Results  Component Value Date   TSH 1.787 04/20/2013   Lab Results  Component Value Date   WBC 6.7 02/18/2015   HGB 10.0* 02/18/2015   HCT 32.1* 02/18/2015   MCV 70.2* 02/18/2015   PLT 328 02/18/2015   Lab Results  Component Value Date  CREATININE 0.86 02/18/2015   BUN 11 02/18/2015   NA 140 02/18/2015   K 4.5 02/18/2015   CL 108 02/18/2015   CO2 24 02/18/2015   Lab Results  Component Value Date   ALT 8 11/21/2014   AST 14 11/21/2014   ALKPHOS 105 11/21/2014   BILITOT 0.9 11/21/2014   Lab Results  Component Value Date   CHOL 162 04/20/2013   Lab Results  Component Value Date   HDL 46 04/20/2013   Lab Results  Component Value Date   LDLCALC 93 04/20/2013   Lab Results  Component Value Date   TRIG 116 04/20/2013   Lab Results  Component Value Date   CHOLHDL 3.5 04/20/2013     Assessment & Plan  HTN (hypertension) Poorly controlled will not alter medications, encouraged DASH diet, minimize caffeine and obtain adequate sleep. Report concerning symptoms and follow up as directed and as needed. But did not take meds today   GERD (gastroesophageal reflux disease) Avoid offending foods, start probiotics. Do not eat large meals in late evening and consider raising head of bed.    Anticoagulated on warfarin 03/13/2015 Coumadin 4 mg on Mon and 7 mg every other day. Tolerating, will continue to monitor   Anemia Iron deficiency but improving continue supplement and monitor. Patient advised to monitor bowel movements if blood appears will need to seek care   Hyperlipidemia Encouraged heart healthy diet, increase exercise, avoid trans fats, consider a krill oil cap daily

## 2015-03-17 ENCOUNTER — Telehealth (INDEPENDENT_AMBULATORY_CARE_PROVIDER_SITE_OTHER): Payer: Self-pay | Admitting: *Deleted

## 2015-03-17 NOTE — Telephone Encounter (Signed)
Please let Dr. Laural Golden know she had labs done at [facility] office and they are in the computer. Please call and let her know if she needs anymore lab work. The return phone number is [phone].

## 2015-03-17 NOTE — Telephone Encounter (Signed)
Forwarded to Dr.Rehman as a reminder , so he can review.

## 2015-03-18 NOTE — Telephone Encounter (Signed)
Patient contacted and I went over the results with her. Her hemoglobin is now 10.4 it was 10 g four weeks ago. Will check H&H in 8 weeks.

## 2015-03-19 ENCOUNTER — Telehealth (INDEPENDENT_AMBULATORY_CARE_PROVIDER_SITE_OTHER): Payer: Self-pay | Admitting: *Deleted

## 2015-03-19 DIAGNOSIS — D509 Iron deficiency anemia, unspecified: Secondary | ICD-10-CM

## 2015-03-19 DIAGNOSIS — Z8719 Personal history of other diseases of the digestive system: Secondary | ICD-10-CM

## 2015-03-19 NOTE — Telephone Encounter (Signed)
Labs are noted for June 2016.

## 2015-03-19 NOTE — Telephone Encounter (Signed)
Per Dr.Rehman the patient will need to have labs drawn in 8 weeks. Noted for June.

## 2015-03-20 ENCOUNTER — Ambulatory Visit: Payer: Medicare Other | Admitting: Family Medicine

## 2015-03-23 NOTE — Assessment & Plan Note (Signed)
Encouraged heart healthy diet, increase exercise, avoid trans fats, consider a krill oil cap daily 

## 2015-03-23 NOTE — Assessment & Plan Note (Signed)
Iron deficiency but improving continue supplement and monitor. Patient advised to monitor bowel movements if blood appears will need to seek care

## 2015-03-23 NOTE — Assessment & Plan Note (Signed)
Avoid offending foods, start probiotics. Do not eat large meals in late evening and consider raising head of bed.  

## 2015-03-23 NOTE — Assessment & Plan Note (Signed)
03/13/2015 Coumadin 4 mg on Mon and 7 mg every other day. Tolerating, will continue to monitor

## 2015-03-25 DIAGNOSIS — Z853 Personal history of malignant neoplasm of breast: Secondary | ICD-10-CM | POA: Diagnosis not present

## 2015-03-28 DIAGNOSIS — I2510 Atherosclerotic heart disease of native coronary artery without angina pectoris: Secondary | ICD-10-CM | POA: Diagnosis not present

## 2015-03-28 DIAGNOSIS — I119 Hypertensive heart disease without heart failure: Secondary | ICD-10-CM | POA: Diagnosis not present

## 2015-03-28 DIAGNOSIS — E785 Hyperlipidemia, unspecified: Secondary | ICD-10-CM | POA: Diagnosis not present

## 2015-04-07 ENCOUNTER — Observation Stay (HOSPITAL_COMMUNITY)
Admission: EM | Admit: 2015-04-07 | Discharge: 2015-04-10 | Disposition: A | Discharge status: Home / Self Care | Payer: Medicare Other | Attending: Internal Medicine | Admitting: Internal Medicine

## 2015-04-07 ENCOUNTER — Encounter (HOSPITAL_COMMUNITY): Payer: Self-pay | Admitting: *Deleted

## 2015-04-07 ENCOUNTER — Emergency Department (HOSPITAL_COMMUNITY): Payer: Medicare Other

## 2015-04-07 DIAGNOSIS — Z7901 Long term (current) use of anticoagulants: Secondary | ICD-10-CM | POA: Insufficient documentation

## 2015-04-07 DIAGNOSIS — I2510 Atherosclerotic heart disease of native coronary artery without angina pectoris: Secondary | ICD-10-CM | POA: Diagnosis not present

## 2015-04-07 DIAGNOSIS — R079 Chest pain, unspecified: Principal | ICD-10-CM | POA: Insufficient documentation

## 2015-04-07 DIAGNOSIS — Q2733 Arteriovenous malformation of digestive system vessel: Secondary | ICD-10-CM | POA: Insufficient documentation

## 2015-04-07 DIAGNOSIS — D509 Iron deficiency anemia, unspecified: Secondary | ICD-10-CM | POA: Diagnosis not present

## 2015-04-07 DIAGNOSIS — Z87891 Personal history of nicotine dependence: Secondary | ICD-10-CM | POA: Insufficient documentation

## 2015-04-07 DIAGNOSIS — Z7982 Long term (current) use of aspirin: Secondary | ICD-10-CM | POA: Diagnosis not present

## 2015-04-07 DIAGNOSIS — K269 Duodenal ulcer, unspecified as acute or chronic, without hemorrhage or perforation: Secondary | ICD-10-CM | POA: Insufficient documentation

## 2015-04-07 DIAGNOSIS — E538 Deficiency of other specified B group vitamins: Secondary | ICD-10-CM | POA: Diagnosis present

## 2015-04-07 DIAGNOSIS — Z853 Personal history of malignant neoplasm of breast: Secondary | ICD-10-CM | POA: Diagnosis not present

## 2015-04-07 DIAGNOSIS — I272 Pulmonary hypertension, unspecified: Secondary | ICD-10-CM | POA: Diagnosis present

## 2015-04-07 DIAGNOSIS — I10 Essential (primary) hypertension: Secondary | ICD-10-CM

## 2015-04-07 DIAGNOSIS — I4891 Unspecified atrial fibrillation: Secondary | ICD-10-CM | POA: Diagnosis not present

## 2015-04-07 DIAGNOSIS — J9611 Chronic respiratory failure with hypoxia: Secondary | ICD-10-CM | POA: Diagnosis not present

## 2015-04-07 DIAGNOSIS — Z951 Presence of aortocoronary bypass graft: Secondary | ICD-10-CM | POA: Insufficient documentation

## 2015-04-07 DIAGNOSIS — K5792 Diverticulitis of intestine, part unspecified, without perforation or abscess without bleeding: Secondary | ICD-10-CM | POA: Insufficient documentation

## 2015-04-07 DIAGNOSIS — E785 Hyperlipidemia, unspecified: Secondary | ICD-10-CM | POA: Insufficient documentation

## 2015-04-07 DIAGNOSIS — Z79899 Other long term (current) drug therapy: Secondary | ICD-10-CM | POA: Insufficient documentation

## 2015-04-07 DIAGNOSIS — I270 Primary pulmonary hypertension: Secondary | ICD-10-CM | POA: Insufficient documentation

## 2015-04-07 DIAGNOSIS — R072 Precordial pain: Secondary | ICD-10-CM | POA: Diagnosis not present

## 2015-04-07 DIAGNOSIS — Z9981 Dependence on supplemental oxygen: Secondary | ICD-10-CM | POA: Diagnosis not present

## 2015-04-07 DIAGNOSIS — I482 Chronic atrial fibrillation: Secondary | ICD-10-CM | POA: Diagnosis not present

## 2015-04-07 DIAGNOSIS — R001 Bradycardia, unspecified: Secondary | ICD-10-CM | POA: Diagnosis present

## 2015-04-07 DIAGNOSIS — R05 Cough: Secondary | ICD-10-CM | POA: Diagnosis not present

## 2015-04-07 DIAGNOSIS — M1990 Unspecified osteoarthritis, unspecified site: Secondary | ICD-10-CM | POA: Insufficient documentation

## 2015-04-07 DIAGNOSIS — J439 Emphysema, unspecified: Secondary | ICD-10-CM | POA: Diagnosis not present

## 2015-04-07 DIAGNOSIS — D649 Anemia, unspecified: Secondary | ICD-10-CM | POA: Diagnosis present

## 2015-04-07 DIAGNOSIS — R911 Solitary pulmonary nodule: Secondary | ICD-10-CM | POA: Diagnosis not present

## 2015-04-07 DIAGNOSIS — J441 Chronic obstructive pulmonary disease with (acute) exacerbation: Secondary | ICD-10-CM | POA: Insufficient documentation

## 2015-04-07 DIAGNOSIS — J984 Other disorders of lung: Secondary | ICD-10-CM | POA: Diagnosis not present

## 2015-04-07 HISTORY — DX: Atherosclerotic heart disease of native coronary artery without angina pectoris: I25.10

## 2015-04-07 HISTORY — DX: Essential (primary) hypertension: I10

## 2015-04-07 HISTORY — DX: Dependence on supplemental oxygen: Z99.81

## 2015-04-07 HISTORY — DX: Chronic respiratory failure with hypoxia: J96.11

## 2015-04-07 HISTORY — DX: Deficiency of other specified B group vitamins: E53.8

## 2015-04-07 LAB — CBC WITH DIFFERENTIAL/PLATELET
BASOS ABS: 0.1 10*3/uL (ref 0.0–0.1)
BASOS PCT: 1 % (ref 0–1)
EOS ABS: 0.1 10*3/uL (ref 0.0–0.7)
Eosinophils Relative: 1 % (ref 0–5)
HEMATOCRIT: 29.9 % — AB (ref 36.0–46.0)
Hemoglobin: 9.2 g/dL — ABNORMAL LOW (ref 12.0–15.0)
LYMPHS ABS: 1.3 10*3/uL (ref 0.7–4.0)
LYMPHS PCT: 17 % (ref 12–46)
MCH: 20.8 pg — ABNORMAL LOW (ref 26.0–34.0)
MCHC: 30.8 g/dL (ref 30.0–36.0)
MCV: 67.5 fL — AB (ref 78.0–100.0)
Monocytes Absolute: 1 10*3/uL (ref 0.1–1.0)
Monocytes Relative: 13 % — ABNORMAL HIGH (ref 3–12)
NEUTROS PCT: 68 % (ref 43–77)
Neutro Abs: 4.9 10*3/uL (ref 1.7–7.7)
Platelets: 491 10*3/uL — ABNORMAL HIGH (ref 150–400)
RBC: 4.43 MIL/uL (ref 3.87–5.11)
RDW: 20.8 % — ABNORMAL HIGH (ref 11.5–15.5)
WBC: 7.4 10*3/uL (ref 4.0–10.5)

## 2015-04-07 LAB — TROPONIN I
Troponin I: <0.03 ng/mL (ref <0.031)
Troponin I: <0.03 ng/mL (ref <0.031)

## 2015-04-07 LAB — COMPREHENSIVE METABOLIC PANEL
ALK PHOS: 77 U/L (ref 38–126)
ALT: 11 U/L — ABNORMAL LOW (ref 14–54)
AST: 18 U/L (ref 15–41)
Albumin: 3.5 g/dL (ref 3.5–5.0)
Anion gap: 7 (ref 5–15)
BILIRUBIN TOTAL: 1 mg/dL (ref 0.3–1.2)
BUN: 14 mg/dL (ref 6–20)
CHLORIDE: 110 mmol/L (ref 101–111)
CO2: 24 mmol/L (ref 22–32)
Calcium: 9.4 mg/dL (ref 8.9–10.3)
Creatinine, Ser: 0.67 mg/dL (ref 0.44–1.00)
GLUCOSE: 87 mg/dL (ref 65–99)
Potassium: 3.7 mmol/L (ref 3.5–5.1)
Sodium: 141 mmol/L (ref 135–145)
TOTAL PROTEIN: 6.4 g/dL — AB (ref 6.5–8.1)

## 2015-04-07 LAB — PROTIME-INR
INR: 2.66 — AB (ref 0.00–1.49)
PROTHROMBIN TIME: 28 s — AB (ref 11.6–15.2)

## 2015-04-07 LAB — D-DIMER, QUANTITATIVE: D-Dimer, Quant: 2.02 ug/mL-FEU — ABNORMAL HIGH (ref 0.00–0.48)

## 2015-04-07 LAB — BRAIN NATRIURETIC PEPTIDE: B Natriuretic Peptide: 806 pg/mL — ABNORMAL HIGH (ref 0.0–100.0)

## 2015-04-07 MED ORDER — ALBUTEROL SULFATE (2.5 MG/3ML) 0.083% IN NEBU: 2.5000 mg | INHALATION_SOLUTION | Freq: Four times a day (QID) | RESPIRATORY_TRACT | Status: DC | PRN

## 2015-04-07 MED ORDER — ACETAMINOPHEN 500 MG PO TABS: 1000.0000 mg | ORAL_TABLET | Freq: Every day | ORAL | Status: DC | PRN

## 2015-04-07 MED ORDER — WARFARIN SODIUM 5 MG PO TABS
7.5000 mg | ORAL_TABLET | ORAL | Status: DC
Start: 2015-04-07 — End: 2015-04-07

## 2015-04-07 MED ORDER — WARFARIN SODIUM 2 MG PO TABS
4.0000 mg | ORAL_TABLET | Freq: Once | ORAL | Status: AC
  Administered 2015-04-07: 4 mg via ORAL
  Filled 2015-04-07: qty 2

## 2015-04-07 MED ORDER — POLYVINYL ALCOHOL 1.4 % OP SOLN: 1.0000 [drp] | Freq: Every day | OPHTHALMIC | Status: DC | PRN

## 2015-04-07 MED ORDER — ISOSORBIDE MONONITRATE ER 60 MG PO TB24
30.0000 mg | ORAL_TABLET | Freq: Two times a day (BID) | ORAL | Status: DC
  Administered 2015-04-07 – 2015-04-10 (×6): 30 mg via ORAL
  Filled 2015-04-07 (×6): qty 1

## 2015-04-07 MED ORDER — WARFARIN - PHARMACIST DOSING INPATIENT
Status: DC
  Administered 2015-04-08: 16:00:00

## 2015-04-07 MED ORDER — FLUTICASONE FUROATE-VILANTEROL 100-25 MCG/INH IN AEPB: 1.0000 | INHALATION_SPRAY | Freq: Every day | RESPIRATORY_TRACT | Status: DC

## 2015-04-07 MED ORDER — METOPROLOL TARTRATE 50 MG PO TABS
50.0000 mg | ORAL_TABLET | Freq: Two times a day (BID) | ORAL | Status: DC
  Administered 2015-04-07 – 2015-04-08 (×2): 50 mg via ORAL
  Filled 2015-04-07 (×2): qty 1

## 2015-04-07 MED ORDER — ASPIRIN EC 81 MG PO TBEC
81.0000 mg | DELAYED_RELEASE_TABLET | Freq: Every day | ORAL | Status: DC
  Administered 2015-04-08: 81 mg via ORAL
  Filled 2015-04-07: qty 1

## 2015-04-07 MED ORDER — IOHEXOL 350 MG/ML SOLN
100.0000 mL | Freq: Once | INTRAVENOUS | Status: AC | PRN
  Administered 2015-04-07: 100 mL via INTRAVENOUS

## 2015-04-07 MED ORDER — FERROUS SULFATE 325 (65 FE) MG PO TABS
325.0000 mg | ORAL_TABLET | Freq: Every day | ORAL | Status: DC
  Administered 2015-04-08 – 2015-04-10 (×3): 325 mg via ORAL
  Filled 2015-04-07 (×3): qty 1

## 2015-04-07 MED ORDER — ASPIRIN 81 MG PO CHEW
324.0000 mg | CHEWABLE_TABLET | Freq: Once | ORAL | Status: AC
Start: 2015-04-07 — End: 2015-04-07
  Administered 2015-04-07: 324 mg via ORAL
  Filled 2015-04-07: qty 4

## 2015-04-07 MED ORDER — NITROGLYCERIN 2 % TD OINT
1.0000 [in_us] | TOPICAL_OINTMENT | Freq: Four times a day (QID) | TRANSDERMAL | Status: DC
  Administered 2015-04-07 – 2015-04-08 (×3): 1 [in_us] via TOPICAL
  Filled 2015-04-07 (×3): qty 1

## 2015-04-07 MED ORDER — SODIUM CHLORIDE 0.9 % IV SOLN
1000.0000 mL | INTRAVENOUS | Status: DC
  Administered 2015-04-07: 1000 mL via INTRAVENOUS

## 2015-04-07 MED ORDER — ACETAMINOPHEN 325 MG PO TABS
650.0000 mg | ORAL_TABLET | ORAL | Status: DC | PRN
  Administered 2015-04-08 – 2015-04-10 (×3): 650 mg via ORAL
  Filled 2015-04-07 (×3): qty 2

## 2015-04-07 MED ORDER — WARFARIN SODIUM 2 MG PO TABS: 4.0000 mg | ORAL_TABLET | Freq: Every day | ORAL | Status: DC

## 2015-04-07 MED ORDER — ONDANSETRON HCL 4 MG/2ML IJ SOLN: 4.0000 mg | Freq: Four times a day (QID) | INTRAMUSCULAR | Status: DC | PRN

## 2015-04-07 MED ORDER — NITROGLYCERIN 0.4 MG SL SUBL: 0.4000 mg | SUBLINGUAL_TABLET | SUBLINGUAL | Status: DC | PRN

## 2015-04-07 MED ORDER — IPRATROPIUM-ALBUTEROL 0.5-2.5 (3) MG/3ML IN SOLN
3.0000 mL | RESPIRATORY_TRACT | Status: DC | PRN
  Administered 2015-04-07: 3 mL via RESPIRATORY_TRACT
  Filled 2015-04-07: qty 3

## 2015-04-07 MED ORDER — HYPROMELLOSE (GONIOSCOPIC) 2.5 % OP SOLN: 1.0000 [drp] | Freq: Every day | OPHTHALMIC | Status: DC | PRN

## 2015-04-07 MED ORDER — PANTOPRAZOLE SODIUM 40 MG PO TBEC
40.0000 mg | DELAYED_RELEASE_TABLET | Freq: Every day | ORAL | Status: DC
  Administered 2015-04-07 – 2015-04-10 (×4): 40 mg via ORAL
  Filled 2015-04-07 (×4): qty 1

## 2015-04-07 MED ORDER — MOMETASONE FURO-FORMOTEROL FUM 200-5 MCG/ACT IN AERO
2.0000 | INHALATION_SPRAY | Freq: Two times a day (BID) | RESPIRATORY_TRACT | Status: DC
  Administered 2015-04-07 – 2015-04-10 (×6): 2 via RESPIRATORY_TRACT
  Filled 2015-04-07: qty 8.8

## 2015-04-07 MED ORDER — ALBUTEROL SULFATE HFA 108 (90 BASE) MCG/ACT IN AERS: 2.0000 | INHALATION_SPRAY | Freq: Four times a day (QID) | RESPIRATORY_TRACT | Status: DC | PRN

## 2015-04-07 NOTE — ED Notes (Signed)
MD at bedside. 

## 2015-04-07 NOTE — ED Provider Notes (Signed)
CSN: [id number]     Arrival date & time 04/07/15  1117 History  This chart was scribed for Dorie Rank, MD by Girtha Hake, ED Scribe. The patient was seen in [facility]. The patient's care was started at 12:28 PM.     Chief Complaint  Patient presents with  . Chest Pain   Patient is a 80 y.o. female presenting with chest pain. The history is provided by the patient. No language interpreter was used.  Chest Pain Pain location:  Substernal area Pain radiates to:  Does not radiate Associated symptoms: cough and shortness of breath   Associated symptoms: no fever    HPI Comments: Lottie A Truman is a 80 y.o. female A-fib, MI, HTN, and coronary bypass surgery who presents to the Emergency Department complaining of inermittent, sub-sternal CP and SOB beginning 1 week ago. Patient reports that she has experienced similar pain in the past. Patient also complains of a non-productive cough and sweating. She reports mild leg swelling. Patient Denies fever or a history of PE.   Patient sees Dr. Alroy Dust, cardiology, in [place], [place].   Past Medical History  Diagnosis Date  . Myocardial infarct   . Cancer     breast  . Hypertension   . Anemia   . Headache(784.0)   . Arthritis   . Duodenal ulcer   . Small bowel arteriovenous malformation   . Hyperlipidemia   . COPD (chronic obstructive pulmonary disease)   . Diverticulitis   . Atrial fibrillation   . Breast cancer 12/09/2014    Diagnosed 43, s/p mastectomy on left, chemo x 3 doses  . Oxygen dependent    Past Surgical History  Procedure Laterality Date  . Coronary artery bypass graft    . Eye surgery    . Breast surgery      Left mastectomy  . Right hand surgery    . Esophagogastroduodenoscopy  07/22/2011    Procedure: ESOPHAGOGASTRODUODENOSCOPY (EGD);  Surgeon: Rogene Houston, MD;  [facility];  Service: Endoscopy;  Laterality: N/A;  . Colonoscopy  07/22/2011    Procedure: COLONOSCOPY;  Surgeon: Rogene Houston, MD;   [facility];  Service: Endoscopy;  Laterality: N/A;  . Givens capsule study  07/30/2011    Procedure: GIVENS CAPSULE STUDY;  Surgeon: Rogene Houston, MD;  [facility];  Service: Endoscopy;  Laterality: N/A;  7:30  . Esophagogastroduodenoscopy N/A 04/23/2013    Procedure: ESOPHAGOGASTRODUODENOSCOPY (EGD);  Surgeon: Rogene Houston, MD;  [facility];  Service: Endoscopy;  Laterality: N/A;   Family History  Problem Relation Age of Onset  . Prostate cancer Brother   . Prostate cancer Brother   . Breast cancer Daughter     Breast Cancer that mets  . Cancer Daughter     thrpat  . Healthy Daughter   . Healthy Son   . Diabetes Son   . Hypertension Son   . Healthy Son   . Healthy Son   . Healthy Son   . Heart disease Son     cardiac arrythmia, with defibrillator  . Healthy Son   . Cancer Son     prostate  . CAD Son    History  Substance Use Topics  . Smoking status: Former Smoker -- 1.00 packs/day for 30 years    Quit date: 10/14/1994  . Smokeless tobacco: Never Used     Comment: Patient quit smoking 25 years ago  . Alcohol Use: No  Comment: none for 8 months, wine occasionally   OB History    No data available     Review of Systems  Constitutional: Negative for fever.  Respiratory: Positive for cough and shortness of breath.   Cardiovascular: Positive for chest pain and leg swelling.  All other systems reviewed and are negative.     Allergies  Review of patient's allergies indicates no known allergies.  Home Medications   Prior to Admission medications   Medication Sig Start Date End Date Taking? Authorizing Provider  acetaminophen (TYLENOL) 500 MG tablet Take 1,000 mg by mouth daily as needed for moderate pain.   Yes Historical Provider, MD  aspirin EC 81 MG tablet Take 81 mg by mouth daily.   Yes Historical Provider, MD  BREO ELLIPTA 100-25 MCG/INH AEPB Inhale 1 puff into the lungs daily.  06/21/14  Yes Historical Provider, MD   ferrous sulfate 325 (65 FE) MG tablet Take 325 mg by mouth daily with breakfast.   Yes Historical Provider, MD  furosemide (LASIX) 40 MG tablet Take 1 tablet (40 mg total) by mouth daily as needed for fluid or edema. 10/16/14  Yes Lezlie Octave Black, NP  hydroxypropyl methylcellulose / hypromellose (ISOPTO TEARS / GONIOVISC) 2.5 % ophthalmic solution Place 1 drop into both eyes daily as needed for dry eyes.   Yes Historical Provider, MD  ipratropium-albuterol (DUONEB) 0.5-2.5 (3) MG/3ML SOLN Take 3 mLs by nebulization.   Yes Historical Provider, MD  isosorbide mononitrate (IMDUR) 30 MG 24 hr tablet Take 30 mg by mouth 2 (two) times daily.  10/12/14  Yes Historical Provider, MD  metoprolol (LOPRESSOR) 50 MG tablet Take 1 tablet (50 mg total) by mouth 2 (two) times daily. 03/13/15  Yes Mosie Lukes, MD  nitroGLYCERIN (NITROSTAT) 0.4 MG SL tablet Place 1 tablet (0.4 mg total) under the tongue every 5 (five) minutes as needed for chest pain. If no relief after 3 doses to ER 03/13/15  Yes Mosie Lukes, MD  omeprazole (PRILOSEC) 20 MG capsule Take 1 capsule by mouth daily. 12/12/14  Yes Historical Provider, MD  PROAIR HFA 108 (90 BASE) MCG/ACT inhaler Inhale 2 puffs into the lungs every 6 (six) hours as needed.  09/03/14  Yes Historical Provider, MD  warfarin (COUMADIN) 4 MG tablet Take 1 tablet (4 mg total) by mouth daily. Take as directed. Patient taking differently: Take 4 mg by mouth daily. Take as directed. Take 4 mg on Monday and 7.5 mg Tuesday-Sunday. 02/17/15  Yes Mosie Lukes, MD  warfarin (COUMADIN) 7.5 MG tablet Take 7.5 mg by mouth as directed. Take 4 mg on Monday and 7.5 mg Tuesday-Sunday.   Yes Historical Provider, MD   Triage Vitals: BP 172/75 mmHg  Pulse 46  Temp(Src) 98 F (36.7 C) (Oral)  Resp 18  Ht 5\' 1"  (1.549 m)  Wt 136 lb (61.689 kg)  BMI 25.71 kg/m2  SpO2 99% Physical Exam  Constitutional: She appears well-developed and well-nourished. No distress.  HENT:  Head: Normocephalic  and atraumatic.  Right Ear: External ear normal.  Left Ear: External ear normal.  Eyes: Conjunctivae are normal. Right eye exhibits no discharge. Left eye exhibits no discharge. No scleral icterus.  Neck: Neck supple. No tracheal deviation present.  Cardiovascular: Normal rate, regular rhythm and intact distal pulses.   Pulmonary/Chest: Effort normal and breath sounds normal. No stridor. No respiratory distress. She has no wheezes. She has no rales. She exhibits tenderness (chest wall).  Abdominal: Soft. Bowel sounds are normal.  She exhibits no distension. There is no tenderness. There is no rebound and no guarding.  Musculoskeletal: She exhibits no edema or tenderness.  Neurological: She is alert. She has normal strength. No cranial nerve deficit (no facial droop, extraocular movements intact, no slurred speech) or sensory deficit. She exhibits normal muscle tone. She displays no seizure activity. Coordination normal.  Skin: Skin is warm and dry. No rash noted.  Psychiatric: She has a normal mood and affect.  Nursing note and vitals reviewed.   ED Course  Procedures (including critical care time) DIAGNOSTIC STUDIES: Oxygen Saturation is 99% on 2L/min, normal by my interpretation.    COORDINATION OF CARE:    Labs Review Labs Reviewed  CBC WITH DIFFERENTIAL/PLATELET - Abnormal; Notable for the following:    Hemoglobin 9.2 (*)    HCT 29.9 (*)    MCV 67.5 (*)    MCH 20.8 (*)    RDW 20.8 (*)    Platelets 491 (*)    Monocytes Relative 13 (*)    All other components within normal limits  COMPREHENSIVE METABOLIC PANEL - Abnormal; Notable for the following:    Total Protein 6.4 (*)    ALT 11 (*)    All other components within normal limits  PROTIME-INR - Abnormal; Notable for the following:    Prothrombin Time 28.0 (*)    INR 2.66 (*)    All other components within normal limits  D-DIMER, QUANTITATIVE - Abnormal; Notable for the following:    D-Dimer, Quant 2.02 (*)    All other  components within normal limits  BRAIN NATRIURETIC PEPTIDE - Abnormal; Notable for the following:    B Natriuretic Peptide 806.0 (*)    All other components within normal limits  TROPONIN I    Imaging Review Dg Chest 2 View  04/07/2015   CLINICAL DATA:  One week history of chest pain and difficulty breathing  EXAM: CHEST  2 VIEW  COMPARISON:  October 14, 2014  FINDINGS: There is underlying emphysematous change. There is no edema or consolidation. Heart is slightly enlarged with pulmonary vascularity within normal limits. No adenopathy. Patient is status post median sternotomy. There is atherosclerotic change in the aorta. No bone lesions.  IMPRESSION: Mild cardiac enlargement. Underlying emphysematous change. No edema or consolidation.   Electronically Signed   By: Lowella Grip III M.D.   On: 04/07/2015 12:26   Ct Angio Chest Pe W/cm &/or Wo Cm  04/07/2015   CLINICAL DATA:  80 year old with chest pain and shortness of breath for 1 week.  EXAM: CT ANGIOGRAPHY CHEST WITH CONTRAST  TECHNIQUE: Multidetector CT imaging of the chest was performed using the standard protocol during bolus administration of intravenous contrast. Multiplanar CT image reconstructions and MIPs were obtained to evaluate the vascular anatomy.  CONTRAST:  150mL OMNIPAQUE IOHEXOL 350 MG/ML SOLN  COMPARISON:  Chest radiograph 04/07/2015  FINDINGS: Negative for pulmonary embolism. The main pulmonary artery is enlarged measuring 3.4 cm. Right subcarinal tissue measures 1.4 cm in the short axis. Ascending thoracic aorta measures 3.5 cm. Coronary artery calcifications. No significant pericardial or pleural effusions. No acute abnormality in the upper abdomen.  The trachea and mainstem bronchi are patent. There is centrilobular emphysema. There is some mild volume loss along the medial right middle lobe. There is a peripheral nodule in the left lower lobe with a maximum diameter of 5 mm. Nodule is best seen on sequence 5, image 65. No  large areas of lung consolidation. Previous median sternotomy. No acute bone  abnormality.  Review of the MIP images confirms the above findings.  IMPRESSION: Negative for pulmonary embolism. Enlarged pulmonary arteries are suggestive for pulmonary hypertension.  Emphysema without focal airspace disease or consolidation.  5 mm nodule in the left lower lobe is indeterminate. If the patient is at high risk for bronchogenic carcinoma, follow-up chest CT at 6-12 months is recommended. If the patient is at low risk for bronchogenic carcinoma, follow-up chest CT at 12 months is recommended. This recommendation follows the consensus statement: Guidelines for Management of Small Pulmonary Nodules Detected on CT Scans: A Statement from the [place] as published in Radiology 2005;237:395-400.   Electronically Signed   By: Markus Daft M.D.   On: 04/07/2015 15:01     EKG Interpretation   Date/Time:  Monday Apr 07 2015 11:23:21 EDT Ventricular Rate:  43 PR Interval:    QRS Duration: 118 QT Interval:  436 QTC Calculation: 368 R Axis:   30 Text Interpretation:  Atrial fibrillation with slow ventricular response  Incomplete left bundle branch block nonspecific st and t wave  abnormalities lateral changes slightly decreased from last tracing  Abnormal ECG Confirmed by Chrissie Dacquisto  MD-J, Chimamanda Siegfried ([id number]) on 04/07/2015 11:30:19  AM      MDM   Final diagnoses:  Pulmonary hypertension  Chest pain, unspecified chest pain type  Pulmonary nodule    Pt presents with chest pain and dyspnea.  Initial cardiac enzymes normal. CT scan without PE but does show known pulmonary hypertension.  Known CAD, heart score of 6.  Pt complains of cough but pain is not pleuritic. Will consult medical service regarding admission, serial enzymes  Pt has known history of CAD.   I personally performed the services described in this documentation, which was scribed in my presence.  The recorded information has been reviewed and is  accurate.    Dorie Rank, MD 04/07/15 [phone]

## 2015-04-07 NOTE — ED Notes (Signed)
Report given to Charlynn Court, RN for room 319.

## 2015-04-07 NOTE — ED Notes (Signed)
Pt c/o intermittent chest pain and sob for the past week, pain is associated with nausea at times and pt describes the pain as "feeling like indigestion":

## 2015-04-07 NOTE — H&P (Signed)
[facility] History and Physical  Yesenia Woods YQM:[id number] DOB: 1935-02-17 DOA: 04/07/2015  Referring physician: ER, Dr. Tomi Bamberger PCP: Penni Homans, MD   Chief Complaint: Chest pain  HPI: Yesenia Woods is a 80 y.o. female  This is a very pleasant 80 year old lady with 1 week history of intermittent central chest pain that has been associated with feeling cold but no sweating, nausea . She thinks that she has had increased dyspnea in the last week. The pain is a pressure sensation. It does not radiate. She does have a previous history of coronary artery disease. She also has history of chronic atrial fibrillation on warfarin therapy. She also has a history of iron deficiency anemia secondary to intermittent/chronic bleed from small bowel angiodysplasia. She is now being admitted for further investigation and management.   Review of Systems:  Apart from symptoms above, all systems negative.   Past Medical History  Diagnosis Date  . Hypertension   . Anemia   . Headache(784.0)   . Arthritis   . Duodenal ulcer   . Small bowel arteriovenous malformation   . Hyperlipidemia   . COPD (chronic obstructive pulmonary disease)   . Diverticulitis   . Atrial fibrillation   . Oxygen dependent   . Myocardial infarct   . Cancer     breast  . Breast cancer 12/09/2014    Diagnosed 53, s/p mastectomy on left, chemo x 3 doses   Past Surgical History  Procedure Laterality Date  . Coronary artery bypass graft    . Eye surgery    . Breast surgery      Left mastectomy  . Right hand surgery    . Esophagogastroduodenoscopy  07/22/2011    Procedure: ESOPHAGOGASTRODUODENOSCOPY (EGD);  Surgeon: Rogene Houston, MD;  [facility];  Service: Endoscopy;  Laterality: N/A;  . Colonoscopy  07/22/2011    Procedure: COLONOSCOPY;  Surgeon: Rogene Houston, MD;  [facility];  Service: Endoscopy;  Laterality: N/A;  . Givens capsule study  07/30/2011    Procedure: GIVENS CAPSULE  STUDY;  Surgeon: Rogene Houston, MD;  [facility];  Service: Endoscopy;  Laterality: N/A;  7:30  . Esophagogastroduodenoscopy N/A 04/23/2013    Procedure: ESOPHAGOGASTRODUODENOSCOPY (EGD);  Surgeon: Rogene Houston, MD;  [facility];  Service: Endoscopy;  Laterality: N/A;   Social History:  reports that she quit smoking about 20 years ago. She has never used smokeless tobacco. She reports that she does not drink alcohol or use illicit drugs.  No Known Allergies  Family History  Problem Relation Age of Onset  . Prostate cancer Brother   . Prostate cancer Brother   . Breast cancer Daughter     Breast Cancer that mets  . Cancer Daughter     thrpat  . Healthy Daughter   . Healthy Son   . Diabetes Son   . Hypertension Son   . Healthy Son   . Healthy Son   . Healthy Son   . Heart disease Son     cardiac arrythmia, with defibrillator  . Healthy Son   . Cancer Son     prostate  . CAD Son     Prior to Admission medications   Medication Sig Start Date End Date Taking? Authorizing Provider  acetaminophen (TYLENOL) 500 MG tablet Take 1,000 mg by mouth daily as needed for moderate pain.   Yes Historical Provider, MD  aspirin EC 81 MG tablet Take 81 mg by  mouth daily.   Yes Historical Provider, MD  BREO ELLIPTA 100-25 MCG/INH AEPB Inhale 1 puff into the lungs daily.  06/21/14  Yes Historical Provider, MD  ferrous sulfate 325 (65 FE) MG tablet Take 325 mg by mouth daily with breakfast.   Yes Historical Provider, MD  furosemide (LASIX) 40 MG tablet Take 1 tablet (40 mg total) by mouth daily as needed for fluid or edema. 10/16/14  Yes Lezlie Octave Black, NP  hydroxypropyl methylcellulose / hypromellose (ISOPTO TEARS / GONIOVISC) 2.5 % ophthalmic solution Place 1 drop into both eyes daily as needed for dry eyes.   Yes Historical Provider, MD  ipratropium-albuterol (DUONEB) 0.5-2.5 (3) MG/3ML SOLN Take 3 mLs by nebulization.   Yes Historical Provider, MD  isosorbide mononitrate  (IMDUR) 30 MG 24 hr tablet Take 30 mg by mouth 2 (two) times daily.  10/12/14  Yes Historical Provider, MD  metoprolol (LOPRESSOR) 50 MG tablet Take 1 tablet (50 mg total) by mouth 2 (two) times daily. 03/13/15  Yes Mosie Lukes, MD  nitroGLYCERIN (NITROSTAT) 0.4 MG SL tablet Place 1 tablet (0.4 mg total) under the tongue every 5 (five) minutes as needed for chest pain. If no relief after 3 doses to ER 03/13/15  Yes Mosie Lukes, MD  omeprazole (PRILOSEC) 20 MG capsule Take 1 capsule by mouth daily. 12/12/14  Yes Historical Provider, MD  PROAIR HFA 108 (90 BASE) MCG/ACT inhaler Inhale 2 puffs into the lungs every 6 (six) hours as needed.  09/03/14  Yes Historical Provider, MD  warfarin (COUMADIN) 4 MG tablet Take 1 tablet (4 mg total) by mouth daily. Take as directed. Patient taking differently: Take 4 mg by mouth daily. Take as directed. Take 4 mg on Monday and 7.5 mg Tuesday-Sunday. 02/17/15  Yes Mosie Lukes, MD  warfarin (COUMADIN) 7.5 MG tablet Take 7.5 mg by mouth as directed. Take 4 mg on Monday and 7.5 mg Tuesday-Sunday.   Yes Historical Provider, MD   Physical Exam: Filed Vitals:   04/07/15 1523 04/07/15 1530 04/07/15 1630 04/07/15 1707  BP: 185/62 165/80 187/74 176/91  Pulse: 61 114 60 53  Temp:   97.9 F (36.6 C) 98 F (36.7 C)  TempSrc:   Oral Oral  Resp: 15 23 22 20   Height:    5' (1.524 m)  Weight:    65.3 kg (143 lb 15.4 oz)  SpO2: 97% 94% 96% 95%    Wt Readings from Last 3 Encounters:  04/07/15 65.3 kg (143 lb 15.4 oz)  03/13/15 61.689 kg (136 lb)  02/28/15 62.506 kg (137 lb 12.8 oz)    General:  Appears calm and comfortable. She looks pale. Eyes: PERRL, normal lids, irises & conjunctiva ENT: grossly normal hearing, lips & tongue Neck: no LAD, masses or thyromegaly Cardiovascular: Irregularly irregular, consistent with atrial fibrillation. Telemetry: Atrial fibrillation. Ventricular rate is in the 40s/50s. There is no acute ST-T wave changes. Respiratory: CTA  bilaterally, no w/r/r. Normal respiratory effort. In particular, there are no crackles, bronchial breathing or wheezing. Abdomen: soft, ntnd Skin: no rash or induration seen on limited exam Musculoskeletal: grossly normal tone BUE/BLE Psychiatric: grossly normal mood and affect, speech fluent and appropriate Neurologic: grossly non-focal.          Labs on Admission:  Basic Metabolic Panel:  Recent Labs Lab 04/07/15 1240  NA 141  K 3.7  CL 110  CO2 24  GLUCOSE 87  BUN 14  CREATININE 0.67  CALCIUM 9.4   Liver Function Tests:  Recent Labs Lab 04/07/15 1240  AST 18  ALT 11*  ALKPHOS 77  BILITOT 1.0  PROT 6.4*  ALBUMIN 3.5   No results for input(s): LIPASE, AMYLASE in the last 168 hours. No results for input(s): AMMONIA in the last 168 hours. CBC:  Recent Labs Lab 04/07/15 1240  WBC 7.4  NEUTROABS 4.9  HGB 9.2*  HCT 29.9*  MCV 67.5*  PLT 491*   Cardiac Enzymes:  Recent Labs Lab 04/07/15 1240  TROPONINI <0.03    BNP (last 3 results)  Recent Labs  04/07/15 1240  BNP 806.0*    ProBNP (last 3 results)  Recent Labs  10/14/14 1143  PROBNP 790.6*    CBG: No results for input(s): GLUCAP in the last 168 hours.  Radiological Exams on Admission: Dg Chest 2 View  04/07/2015   CLINICAL DATA:  One week history of chest pain and difficulty breathing  EXAM: CHEST  2 VIEW  COMPARISON:  October 14, 2014  FINDINGS: There is underlying emphysematous change. There is no edema or consolidation. Heart is slightly enlarged with pulmonary vascularity within normal limits. No adenopathy. Patient is status post median sternotomy. There is atherosclerotic change in the aorta. No bone lesions.  IMPRESSION: Mild cardiac enlargement. Underlying emphysematous change. No edema or consolidation.   Electronically Signed   By: Lowella Grip III M.D.   On: 04/07/2015 12:26   Ct Angio Chest Pe W/cm &/or Wo Cm  04/07/2015   CLINICAL DATA:  80 year old with chest pain and  shortness of breath for 1 week.  EXAM: CT ANGIOGRAPHY CHEST WITH CONTRAST  TECHNIQUE: Multidetector CT imaging of the chest was performed using the standard protocol during bolus administration of intravenous contrast. Multiplanar CT image reconstructions and MIPs were obtained to evaluate the vascular anatomy.  CONTRAST:  137mL OMNIPAQUE IOHEXOL 350 MG/ML SOLN  COMPARISON:  Chest radiograph 04/07/2015  FINDINGS: Negative for pulmonary embolism. The main pulmonary artery is enlarged measuring 3.4 cm. Right subcarinal tissue measures 1.4 cm in the short axis. Ascending thoracic aorta measures 3.5 cm. Coronary artery calcifications. No significant pericardial or pleural effusions. No acute abnormality in the upper abdomen.  The trachea and mainstem bronchi are patent. There is centrilobular emphysema. There is some mild volume loss along the medial right middle lobe. There is a peripheral nodule in the left lower lobe with a maximum diameter of 5 mm. Nodule is best seen on sequence 5, image 65. No large areas of lung consolidation. Previous median sternotomy. No acute bone abnormality.  Review of the MIP images confirms the above findings.  IMPRESSION: Negative for pulmonary embolism. Enlarged pulmonary arteries are suggestive for pulmonary hypertension.  Emphysema without focal airspace disease or consolidation.  5 mm nodule in the left lower lobe is indeterminate. If the patient is at high risk for bronchogenic carcinoma, follow-up chest CT at 6-12 months is recommended. If the patient is at low risk for bronchogenic carcinoma, follow-up chest CT at 12 months is recommended. This recommendation follows the consensus statement: Guidelines for Management of Small Pulmonary Nodules Detected on CT Scans: A Statement from the [place] as published in Radiology 2005;237:395-400.   Electronically Signed   By: Markus Daft M.D.   On: 04/07/2015 15:01    EKG: Independently reviewed. Atrial fibrillation with slow  ventricular rate. No acute ST-T wave changes.  Assessment/Plan   1. Chest pain. The etiology is unclear. She does have several risk factors and her heart score is elevated at 6. CT angiogram of  the chest did not indicate pulmonary embolism. We will cycle cardiac enzymes. I will order echocardiogram and we will request cardiology consultation. 2. Anemia. She looks very pale but hemoglobin is stable at 9.2. We will monitor hemoglobin closely as she may require blood transfusion. She does have a history of intermittent/chronic bleed from small bowel angiodysplasia. She takes iron supplementation. 3. Atrial fibrillation. This is chronic and she is on warfarin. Her INR is therapeutic at 2.66. 4. Hypertension. Stable.  Further recommendations will depend on patient's hospital progress.  Code Status: Full code.  DVT Prophylaxis: On warfarin.  Family Communication: I discussed the plan with the patient at the bedside.   Disposition Plan: Home when medically stable.   Time spent: 60 minutes.  Doree Albee [facility] Pager [phone].

## 2015-04-07 NOTE — Progress Notes (Signed)
[place] for Coumadin Indication: atrial fibrillation  No Known Allergies  Patient Measurements: Height: 5' (152.4 cm) Weight: 143 lb 15.4 oz (65.3 kg) IBW/kg (Calculated) : 45.5  Vital Signs: Temp: 98.4 F (36.9 C) (05/23 1728) Temp Source: Oral (05/23 1728) BP: 165/80 mmHg (05/23 1728) Pulse Rate: 56 (05/23 1728)  Labs:  Recent Labs  04/07/15 1240  HGB 9.2*  HCT 29.9*  PLT 491*  LABPROT 28.0*  INR 2.66*  CREATININE 0.67  TROPONINI <0.03    Estimated Creatinine Clearance: 48.9 mL/min (by C-G formula based on Cr of 0.67).   Medical History: Past Medical History  Diagnosis Date  . Hypertension   . Anemia   . Headache(784.0)   . Arthritis   . Duodenal ulcer   . Small bowel arteriovenous malformation   . Hyperlipidemia   . COPD (chronic obstructive pulmonary disease)   . Diverticulitis   . Atrial fibrillation   . Oxygen dependent   . Myocardial infarct   . Cancer     breast  . Breast cancer 12/09/2014    Diagnosed 87, s/p mastectomy on left, chemo x 3 doses    Medications:  Prescriptions prior to admission  Medication Sig Dispense Refill Last Dose  . acetaminophen (TYLENOL) 500 MG tablet Take 1,000 mg by mouth daily as needed for moderate pain.   Past Week at Unknown time  . aspirin EC 81 MG tablet Take 81 mg by mouth daily.   04/07/2015 at Unknown time  . BREO ELLIPTA 100-25 MCG/INH AEPB Inhale 1 puff into the lungs daily.    04/07/2015 at Unknown time  . ferrous sulfate 325 (65 FE) MG tablet Take 325 mg by mouth daily with breakfast.   unknown  . furosemide (LASIX) 40 MG tablet Take 1 tablet (40 mg total) by mouth daily as needed for fluid or edema. 30 tablet 0 04/06/2015 at Unknown time  . hydroxypropyl methylcellulose / hypromellose (ISOPTO TEARS / GONIOVISC) 2.5 % ophthalmic solution Place 1 drop into both eyes daily as needed for dry eyes.   unknown  . ipratropium-albuterol (DUONEB) 0.5-2.5 (3) MG/3ML SOLN Take 3  mLs by nebulization.   unknown  . isosorbide mononitrate (IMDUR) 30 MG 24 hr tablet Take 30 mg by mouth 2 (two) times daily.    04/07/2015 at Unknown time  . metoprolol (LOPRESSOR) 50 MG tablet Take 1 tablet (50 mg total) by mouth 2 (two) times daily. 60 tablet 1 04/07/2015 at 01000  . nitroGLYCERIN (NITROSTAT) 0.4 MG SL tablet Place 1 tablet (0.4 mg total) under the tongue every 5 (five) minutes as needed for chest pain. If no relief after 3 doses to ER 25 tablet 3 unknown  . omeprazole (PRILOSEC) 20 MG capsule Take 1 capsule by mouth daily.   04/07/2015 at Unknown time  . PROAIR HFA 108 (90 BASE) MCG/ACT inhaler Inhale 2 puffs into the lungs every 6 (six) hours as needed.    unknown  . warfarin (COUMADIN) 4 MG tablet Take 1 tablet (4 mg total) by mouth daily. Take as directed. (Patient taking differently: Take 4 mg by mouth daily. Take as directed. Take 4 mg on Monday and 7.5 mg Tuesday-Sunday.) 30 tablet 2 04/07/2015 at Unknown time  . warfarin (COUMADIN) 7.5 MG tablet Take 7.5 mg by mouth as directed. Take 4 mg on Monday and 7.5 mg Tuesday-Sunday.   04/06/2015 at Unknown time    Assessment: 80 yo F on chronic Coumadin for Afib.  Home dose listed above-  verified with patient that dose is correct and she has not taken dose today.  INR therapeutic on admission.  Patient with chronic anemia, but no active bleeding noted.   Goal of Therapy:  INR 2-3   Plan:  Coumadin 4mg  po x1 now Daily INR  Tenoch Mcclure, Lavonia Drafts 04/07/2015,5:38 PM

## 2015-04-08 ENCOUNTER — Encounter (HOSPITAL_COMMUNITY): Payer: Self-pay | Admitting: Cardiology

## 2015-04-08 ENCOUNTER — Observation Stay (HOSPITAL_BASED_OUTPATIENT_CLINIC_OR_DEPARTMENT_OTHER): Payer: Medicare Other

## 2015-04-08 DIAGNOSIS — I25118 Atherosclerotic heart disease of native coronary artery with other forms of angina pectoris: Secondary | ICD-10-CM

## 2015-04-08 DIAGNOSIS — R079 Chest pain, unspecified: Secondary | ICD-10-CM

## 2015-04-08 DIAGNOSIS — R911 Solitary pulmonary nodule: Secondary | ICD-10-CM | POA: Diagnosis not present

## 2015-04-08 DIAGNOSIS — R072 Precordial pain: Secondary | ICD-10-CM | POA: Diagnosis not present

## 2015-04-08 DIAGNOSIS — R05 Cough: Secondary | ICD-10-CM | POA: Diagnosis not present

## 2015-04-08 DIAGNOSIS — R001 Bradycardia, unspecified: Secondary | ICD-10-CM

## 2015-04-08 DIAGNOSIS — I25119 Atherosclerotic heart disease of native coronary artery with unspecified angina pectoris: Secondary | ICD-10-CM | POA: Diagnosis not present

## 2015-04-08 DIAGNOSIS — I482 Chronic atrial fibrillation: Secondary | ICD-10-CM | POA: Diagnosis not present

## 2015-04-08 DIAGNOSIS — I10 Essential (primary) hypertension: Secondary | ICD-10-CM | POA: Diagnosis not present

## 2015-04-08 DIAGNOSIS — I270 Primary pulmonary hypertension: Secondary | ICD-10-CM | POA: Diagnosis not present

## 2015-04-08 LAB — PROTIME-INR
INR: 3.09 — AB (ref 0.00–1.49)
PROTHROMBIN TIME: 31.3 s — AB (ref 11.6–15.2)

## 2015-04-08 LAB — TROPONIN I: Troponin I: <0.03 ng/mL (ref <0.031)

## 2015-04-08 MED ORDER — IPRATROPIUM-ALBUTEROL 0.5-2.5 (3) MG/3ML IN SOLN
3.0000 mL | Freq: Two times a day (BID) | RESPIRATORY_TRACT | Status: DC
  Administered 2015-04-08 – 2015-04-10 (×5): 3 mL via RESPIRATORY_TRACT
  Filled 2015-04-08 (×5): qty 3

## 2015-04-08 MED ORDER — REGADENOSON 0.4 MG/5ML IV SOLN
0.4000 mg | Freq: Once | INTRAVENOUS | Status: AC
  Administered 2015-04-09: 0.4 mg via INTRAVENOUS

## 2015-04-08 MED ORDER — WARFARIN SODIUM 1 MG PO TABS
1.0000 mg | ORAL_TABLET | Freq: Once | ORAL | Status: AC
  Administered 2015-04-08: 1 mg via ORAL
  Filled 2015-04-08: qty 1

## 2015-04-08 MED ORDER — AMLODIPINE BESYLATE 5 MG PO TABS
5.0000 mg | ORAL_TABLET | Freq: Every day | ORAL | Status: DC
  Administered 2015-04-08 – 2015-04-10 (×3): 5 mg via ORAL
  Filled 2015-04-08 (×3): qty 1

## 2015-04-08 NOTE — Consult Note (Signed)
Primary cardiologist: Dr. Delanna Notice [facility]) Consulting cardiologist: Dr. Satira Sark  Reason for consultation: Chest pain  Clinical Summary Ms. Bellamy is a 80 y.o.female with past medical history outlined below, currently admitted to the hospitalist service with chest pain. I reviewed her chart, she established with Dr. Stanford Breed back in January of this year after moving from [place] to [place] to live with her daughter. Since that time she states that she has had a falling out with her daughter, and just recently moved back to [place]. She states that she saw Dr. Rosalita Chessman recently as well. She reports being under a significant amount of stress, arguing with her daughter, also other family concerns. She presents to the hospital complaining of chest pressure and shortness of breath, episodes lasting several hours, not clearly responsive to nitroglycerin.  Cardiac history includes single-vessel CABG at [facility] in 1990 with LIMA to the LAD. She also reports a history of recurrent chest pain over the years, states that she had a normal stress test in [place] about a year and a half to 2 years ago. Workup in the hospital includes normal cardiac markers, ECG showing slow atrial fibrillation with IVCD of incomplete left bundle branch block type, LVH, probable repolarization ST segment abnormalities that appear to be old based on old tracings. Chest CT angiogram was negative for pulmonary embolus with enlarged pulmonary arteries noted, no consolidation, 5 mm left lower lobe indeterminant nodule. She complains of mild chest discomfort this morning, otherwise in no distress.  Of note, she has shown evidence of slow atrial fibrillation with heart rates into the 40s, was on Lopressor 50 mg twice daily at home, this has been held.   No Known Allergies  Medications Scheduled Medications: . aspirin EC  81 mg Oral Daily  . ferrous sulfate  325 mg Oral Q breakfast  . ipratropium-albuterol  3 mL  Nebulization BID  . isosorbide mononitrate  30 mg Oral BID  . mometasone-formoterol  2 puff Inhalation BID  . nitroGLYCERIN  1 inch Topical 4 times per day  . pantoprazole  40 mg Oral Daily  . Warfarin - Pharmacist Dosing Inpatient   Does not apply Q24H    Infusions: . sodium chloride 1,000 mL (04/07/15 1307)    PRN Medications: acetaminophen, albuterol, nitroGLYCERIN, ondansetron (ZOFRAN) IV, polyvinyl alcohol   Past Medical History  Diagnosis Date  . Essential hypertension   . Anemia   . Headache(784.0)   . Arthritis   . Duodenal ulcer   . Small bowel arteriovenous malformation   . Hyperlipidemia   . COPD (chronic obstructive pulmonary disease)   . Diverticulitis   . Atrial fibrillation   . Oxygen dependent   . CAD (coronary artery disease)     Status post LIMA to LAD at [facility] 04/1989  . Breast cancer 12/09/2014    Mastectomy on left, chemo x 3 doses    Past Surgical History  Procedure Laterality Date  . Coronary artery bypass graft  1990    [facility] - LIMA to LAD  . Eye surgery    . Mastectomy Left   . Hand surgery Right   . Esophagogastroduodenoscopy  07/22/2011    Procedure: ESOPHAGOGASTRODUODENOSCOPY (EGD);  Surgeon: Rogene Houston, MD;  [facility];  Service: Endoscopy;  Laterality: N/A;  . Colonoscopy  07/22/2011    Procedure: COLONOSCOPY;  Surgeon: Rogene Houston, MD;  [facility];  Service: Endoscopy;  Laterality: N/A;  . Givens capsule study  07/30/2011  Procedure: GIVENS CAPSULE STUDY;  Surgeon: Rogene Houston, MD;  [facility];  Service: Endoscopy;  Laterality: N/A;  7:30  . Esophagogastroduodenoscopy N/A 04/23/2013    Procedure: ESOPHAGOGASTRODUODENOSCOPY (EGD);  Surgeon: Rogene Houston, MD;  [facility];  Service: Endoscopy;  Laterality: N/A;    Family History  Problem Relation Age of Onset  . Prostate cancer Brother   . Prostate cancer Brother   . Breast cancer Daughter     Breast Cancer that mets  .  Throat cancer Daughter   . Healthy Daughter   . Healthy Son   . Diabetes Son   . Hypertension Son   . Healthy Son   . Healthy Son   . Healthy Son   . Heart disease Son     Cardiac arrythmia, with defibrillator  . Healthy Son   . Prostate cancer Son   . CAD Son     Social History Ms. Bebout reports that she quit smoking about 20 years ago. Her smoking use included Cigarettes. She has a 30 pack-year smoking history. She has never used smokeless tobacco. Ms. Pyatt reports that she does not drink alcohol.  Review of Systems Complete review of systems negative except as otherwise outlined in the clinical summary and also the following. No cough, fevers or chills. Appetite has been stable. No reported bleeding problems on Coumadin. No palpitations.  Physical Examination Blood pressure 175/72, pulse 60, temperature 98 F (36.7 C), temperature source Oral, resp. rate 18, height 5' (1.524 m), weight 143 lb 15.4 oz (65.3 kg), SpO2 98 %.  Intake/Output Summary (Last 24 hours) at 04/08/15 1056 Last data filed at 04/08/15 0845  Gross per 24 hour  Intake    840 ml  Output      0 ml  Net    840 ml   Telemetry: Atrial fibrillation.  Patient appears comfortable at rest. HEENT: Conjunctiva and lids normal, oropharynx clear. Neck: Supple, no elevated JVP or carotid bruits, no thyromegaly. Lungs: Clear to auscultation, nonlabored breathing at rest. Cardiac: Irregularly irregular rhythm, no S3 or significant systolic murmur, no pericardial rub. Abdomen: Soft, nontender, bowel sounds present. Extremities: No pitting edema, distal pulses 2+. Skin: Warm and dry. Musculoskeletal: No kyphosis. Neuropsychiatric: Alert and oriented x3, affect grossly appropriate.   Lab Results  Basic Metabolic Panel:  Recent Labs Lab 04/07/15 1240  NA 141  K 3.7  CL 110  CO2 24  GLUCOSE 87  BUN 14  CREATININE 0.67  CALCIUM 9.4    Liver Function Tests:  Recent Labs Lab 04/07/15 1240  AST 18    ALT 11*  ALKPHOS 77  BILITOT 1.0  PROT 6.4*  ALBUMIN 3.5    CBC:  Recent Labs Lab 04/07/15 1240  WBC 7.4  NEUTROABS 4.9  HGB 9.2*  HCT 29.9*  MCV 67.5*  PLT 491*    Cardiac Enzymes:  Recent Labs Lab 04/07/15 1240 04/07/15 2009 04/07/15 2258 04/08/15 0233  TROPONINI <0.03 <0.03 <0.03 <0.03    BNP: 806   Impression  1. Recurrent chest pain with typical and atypical features, also described in the setting of recent psychosocial stressors as outlined above. Patient has a known history of ischemic heart disease, ECG shows relatively stable abnormalities and cardiac enzymes have been normal. Possibly worsening angina, although symptomatic stress also to be considered with recent history.  2. CAD status post single-vessel CABG at [place] in 1990 with LIMA to LAD. She is followed by Dr. Rosalita Chessman in [place].  3. Essential hypertension, blood pressure has been elevated during hospital stay.  4. Bradycardia, patient has history of chronic atrial fibrillation on Coumadin and Lopressor which has been 50 mg twice daily at home. Currently beta blockers held.  5. Oxygen-dependent COPD.   Recommendations  History reviewed, discussed with patient. For now would hold Lopressor and follow heart rate response. Stop aspirin since she is concurrently on Coumadin. Change Imdur to 30 mg twice daily which was her home dose, stop nitroglycerin paste, add Norvasc beginning at 5 mg daily. We will arrange an inpatient Lexiscan Cardiolite for tomorrow to assess for the presence and extent of ischemia to help guide next step.  Satira Sark, M.D., F.A.C.C.

## 2015-04-08 NOTE — Progress Notes (Signed)
[facility] PROGRESS NOTE  Yesenia Woods NOB:[id number] DOB: 01-18-1935 DOA: 04/07/2015 PCP: Penni Homans, MD  Assessment/Plan: 1. Chest pain. Improved today. Reports psychosocial stressors at home. Hx ischemic heart disease.  CT angiogram of the chest did not indicate pulmonary embolism. Troponin negative x3. EKG with no acute changes. Evaluated by cardiology who recommend Lexiscan Cardiolite for tomorrow.  2. Anemia. Chart review indicates current HG at baseline. . We will monitor hemoglobin closely as she may require blood transfusion. She does have a history of intermittent/chronic bleed from small bowel angiodysplasia. She takes iron supplementation. 3. Atrial fibrillation. With slow rate. BB stopped and NTG paste discontinued. Imdur dose adjusted and norvasc started per cardiology. On coumadin. Her INR is therapeutic at 3.09. 1. Hypertension. Stable. Medications adjusted as noted above  Code Status: full Family Communication: none present Disposition Plan: home when ready   Consultants:  cardiology  Procedures:  none  Antibiotics:  none  HPI/Subjective: Denies pain discomfort  Objective: Filed Vitals:   04/08/15 0948  BP: 175/72  Pulse: 60  Temp: 98 F (36.7 C)  Resp: 18    Intake/Output Summary (Last 24 hours) at 04/08/15 1256 Last data filed at 04/08/15 0845  Gross per 24 hour  Intake    840 ml  Output      0 ml  Net    840 ml   Filed Weights   04/07/15 1120 04/07/15 1707 04/07/15 1728  Weight: 61.689 kg (136 lb) 65.3 kg (143 lb 15.4 oz) 65.3 kg (143 lb 15.4 oz)    Exam:   General:  Well nourished appears comfortable  Cardiovascular: irregularly irregular  Respiratory: normal effort BS clear bilaterally  Abdomen: non-distended +BS  Musculoskeletal: no clubbing or cyanosis   Data Reviewed: Basic Metabolic Panel:  Recent Labs Lab 04/07/15 1240  NA 141  K 3.7  CL 110  CO2 24  GLUCOSE 87  BUN 14  CREATININE 0.67  CALCIUM 9.4    Liver Function Tests:  Recent Labs Lab 04/07/15 1240  AST 18  ALT 11*  ALKPHOS 77  BILITOT 1.0  PROT 6.4*  ALBUMIN 3.5   No results for input(s): LIPASE, AMYLASE in the last 168 hours. No results for input(s): AMMONIA in the last 168 hours. CBC:  Recent Labs Lab 04/07/15 1240  WBC 7.4  NEUTROABS 4.9  HGB 9.2*  HCT 29.9*  MCV 67.5*  PLT 491*   Cardiac Enzymes:  Recent Labs Lab 04/07/15 1240 04/07/15 2009 04/07/15 2258 04/08/15 0233  TROPONINI <0.03 <0.03 <0.03 <0.03   BNP (last 3 results)  Recent Labs  04/07/15 1240  BNP 806.0*    ProBNP (last 3 results)  Recent Labs  10/14/14 1143  PROBNP 790.6*    CBG: No results for input(s): GLUCAP in the last 168 hours.  No results found for this or any previous visit (from the past 240 hour(s)).   Studies: Dg Chest 2 View  04/07/2015   CLINICAL DATA:  One week history of chest pain and difficulty breathing  EXAM: CHEST  2 VIEW  COMPARISON:  October 14, 2014  FINDINGS: There is underlying emphysematous change. There is no edema or consolidation. Heart is slightly enlarged with pulmonary vascularity within normal limits. No adenopathy. Patient is status post median sternotomy. There is atherosclerotic change in the aorta. No bone lesions.  IMPRESSION: Mild cardiac enlargement. Underlying emphysematous change. No edema or consolidation.   Electronically Signed   By: Lowella Grip III M.D.   On: 04/07/2015 12:26  Ct Angio Chest Pe W/cm &/or Wo Cm  04/07/2015   CLINICAL DATA:  80 year old with chest pain and shortness of breath for 1 week.  EXAM: CT ANGIOGRAPHY CHEST WITH CONTRAST  TECHNIQUE: Multidetector CT imaging of the chest was performed using the standard protocol during bolus administration of intravenous contrast. Multiplanar CT image reconstructions and MIPs were obtained to evaluate the vascular anatomy.  CONTRAST:  13mL OMNIPAQUE IOHEXOL 350 MG/ML SOLN  COMPARISON:  Chest radiograph 04/07/2015   FINDINGS: Negative for pulmonary embolism. The main pulmonary artery is enlarged measuring 3.4 cm. Right subcarinal tissue measures 1.4 cm in the short axis. Ascending thoracic aorta measures 3.5 cm. Coronary artery calcifications. No significant pericardial or pleural effusions. No acute abnormality in the upper abdomen.  The trachea and mainstem bronchi are patent. There is centrilobular emphysema. There is some mild volume loss along the medial right middle lobe. There is a peripheral nodule in the left lower lobe with a maximum diameter of 5 mm. Nodule is best seen on sequence 5, image 65. No large areas of lung consolidation. Previous median sternotomy. No acute bone abnormality.  Review of the MIP images confirms the above findings.  IMPRESSION: Negative for pulmonary embolism. Enlarged pulmonary arteries are suggestive for pulmonary hypertension.  Emphysema without focal airspace disease or consolidation.  5 mm nodule in the left lower lobe is indeterminate. If the patient is at high risk for bronchogenic carcinoma, follow-up chest CT at 6-12 months is recommended. If the patient is at low risk for bronchogenic carcinoma, follow-up chest CT at 12 months is recommended. This recommendation follows the consensus statement: Guidelines for Management of Small Pulmonary Nodules Detected on CT Scans: A Statement from the [place] as published in Radiology 2005;237:395-400.   Electronically Signed   By: Markus Daft M.D.   On: 04/07/2015 15:01    Scheduled Meds: . amLODipine  5 mg Oral Daily  . ferrous sulfate  325 mg Oral Q breakfast  . ipratropium-albuterol  3 mL Nebulization BID  . isosorbide mononitrate  30 mg Oral BID  . mometasone-formoterol  2 puff Inhalation BID  . pantoprazole  40 mg Oral Daily  . [START ON 04/09/2015] regadenoson  0.4 mg Intravenous Once  . Warfarin - Pharmacist Dosing Inpatient   Does not apply Q24H   Continuous Infusions: . sodium chloride 1,000 mL (04/07/15 1307)     Active Problems:   Anemia   CAD (coronary artery disease)   HTN (hypertension)   Iron deficiency anemia   Chest pain   Atrial fibrillation   Pulmonary hypertension    Time spent: 30 minutes    [facility] Pager [phone]. If 7PM-7AM, please contact night-coverage at [email or web address], password [facility] 04/08/2015, 12:56 PM

## 2015-04-08 NOTE — Clinical Social Work Note (Signed)
Clinical Social Work Assessment  Patient Details  Name: Yesenia Woods MRN: [id number] Date of Birth: 1935-04-15  Date of referral:  04/08/15               Reason for consult:  Other (Comment Required) (ALF options)                Permission sought to share information with:    Permission granted to share information::     Name::        Agency::     Relationship::     Contact Information:     Housing/Transportation Living arrangements for the past 2 months:  Apartment Source of Information:  Patient Patient Interpreter Needed:  None Criminal Activity/Legal Involvement Pertinent to Current Situation/Hospitalization:  No - Comment as needed Significant Relationships:  Adult Children Lives with:  Self Do you feel safe going back to the place where you live?  Yes Need for family participation in patient care:  Yes (Comment) (sons are involved)  Care giving concerns:  Pt lives alone.    Social Worker assessment / plan:  CSW met with pt at bedside following referral for ALF options. Pt alert and oriented and reports that she moved into a senior citizen's apartment a few weeks ago. She likes it so far. Her only concern is that she has difficulty cleaning the bathroom. Pt has 5 sons who live nearby, but several of them also have health issues. Pt ambulates with a cane and said she still drives. She wears 2 liters of oxygen. CSW discussed d/c plan with pt and ALF as an option. Pt refuses to consider placement and wants to return to her apartment. CSW will sign off, but can be reconsulted if needed.  Employment status:  Retired Forensic scientist:  Medicare PT Recommendations:  Not assessed at this time [place] / Referral to community resources:  Other (Comment Required) (pt refuses ALF)  Patient/Family's Response to care:  Pt requests to return home at d/c.   Patient/Family's Understanding of and Emotional Response to Diagnosis, Current Treatment, and Prognosis:  Pt reports she  came to hospital due to shortness of breath and chest pain. She feels this is related to her difficult relationship with her daughter. Pt said that she lived with her for 5 months until recently and it was a very stressful time. She is much happier in her new apartment and plans to return there.   Emotional Assessment Appearance:  Appears stated age Attitude/Demeanor/Rapport:  Other (cooperative) Affect (typically observed):  Appropriate Orientation:  Oriented to Self, Oriented to Place, Oriented to  Time, Oriented to Situation Alcohol / Substance use:  Not Applicable Psych involvement (Current and /or in the community):  No (Comment)  Discharge Needs  Concerns to be addressed:  No discharge needs identified Readmission within the last 30 days:  No Current discharge risk:  Lives alone Barriers to Discharge:  Continued Medical Work up   [employer], [employer], [place] 04/08/2015, 8:50 AM [phone]

## 2015-04-08 NOTE — Care Management Note (Signed)
Case Management Note  Patient Details  Name: Yesenia Woods MRN: [id number] Date of Birth: 05/23/1935  Subjective/Objective:                  Pt admitted from home with CP. Pt lives alone and will return home at discharge. Pt has a cane that she uses prn and has home O2 with [employer]. Pt stated that she still drives herself to her appts.  Action/Plan: No CM needs noted.  Expected Discharge Date:                  Expected Discharge Plan:  Home/Self Care  In-House Referral:  NA  Discharge planning Services  NA  Post Acute Care Choice:  NA Choice offered to:  NA  DME Arranged:    DME Agency:     HH Arranged:    HH Agency:     Status of Service:  Completed, signed off  Medicare Important Message Given:    Date Medicare IM Given:    Medicare IM give by:    Date Additional Medicare IM Given:    Additional Medicare Important Message give by:     If discussed at [place] of Stay Meetings, dates discussed:    Additional Comments:  Joylene Draft, RN 04/08/2015, 11:48 AM

## 2015-04-08 NOTE — Progress Notes (Signed)
Pt has a 2.22 sec pause. Dr.Kirby made aware. There are no new orders at this time. Will continue to monitor.

## 2015-04-08 NOTE — Progress Notes (Signed)
Patient sinus brady on the monitor. Dr. Jerilee Hoh notified. Dyanne Carrel, NP returned call.

## 2015-04-08 NOTE — Progress Notes (Signed)
[place] for Coumadin Indication: atrial fibrillation  No Known Allergies  Patient Measurements: Height: 5' (152.4 cm) Weight: 143 lb 15.4 oz (65.3 kg) IBW/kg (Calculated) : 45.5  Vital Signs: Temp: 98 F (36.7 C) (05/24 0948) Temp Source: Oral (05/24 0948) BP: 175/72 mmHg (05/24 0948) Pulse Rate: 60 (05/24 0948)  Labs:  Recent Labs  04/07/15 1240 04/07/15 2009 04/07/15 2258 04/08/15 0233  HGB 9.2*  --   --   --   HCT 29.9*  --   --   --   PLT 491*  --   --   --   LABPROT 28.0*  --   --  31.3*  INR 2.66*  --   --  3.09*  CREATININE 0.67  --   --   --   TROPONINI <0.03 <0.03 <0.03 <0.03   Estimated Creatinine Clearance: 48.9 mL/min (by C-G formula based on Cr of 0.67).  Medical History: Past Medical History  Diagnosis Date  . Essential hypertension   . Anemia   . Headache(784.0)   . Arthritis   . Duodenal ulcer   . Small bowel arteriovenous malformation   . Hyperlipidemia   . COPD (chronic obstructive pulmonary disease)   . Diverticulitis   . Atrial fibrillation   . Oxygen dependent   . CAD (coronary artery disease)     Status post [facility] to LAD [facility]  . Breast cancer 12/09/2014    Mastectomy on left, chemo x 3 doses   Medications:  Prescriptions prior to admission  Medication Sig Dispense Refill Last Dose  . acetaminophen (TYLENOL) 500 MG tablet Take 1,000 mg by mouth daily as needed for moderate pain.   Past Week at Unknown time  . aspirin EC 81 MG tablet Take 81 mg by mouth daily.   04/07/2015 at Unknown time  . BREO ELLIPTA 100-25 MCG/INH AEPB Inhale 1 puff into the lungs daily.    04/07/2015 at Unknown time  . ferrous sulfate 325 (65 FE) MG tablet Take 325 mg by mouth daily with breakfast.   unknown  . furosemide (LASIX) 40 MG tablet Take 1 tablet (40 mg total) by mouth daily as needed for fluid or edema. 30 tablet 0 04/06/2015 at Unknown time  . hydroxypropyl methylcellulose / hypromellose (ISOPTO TEARS /  GONIOVISC) 2.5 % ophthalmic solution Place 1 drop into both eyes daily as needed for dry eyes.   unknown  . ipratropium-albuterol (DUONEB) 0.5-2.5 (3) MG/3ML SOLN Take 3 mLs by nebulization.   unknown  . isosorbide mononitrate (IMDUR) 30 MG 24 hr tablet Take 30 mg by mouth 2 (two) times daily.    04/07/2015 at Unknown time  . metoprolol (LOPRESSOR) 50 MG tablet Take 1 tablet (50 mg total) by mouth 2 (two) times daily. 60 tablet 1 04/07/2015 at 01000  . nitroGLYCERIN (NITROSTAT) 0.4 MG SL tablet Place 1 tablet (0.4 mg total) under the tongue every 5 (five) minutes as needed for chest pain. If no relief after 3 doses to ER 25 tablet 3 unknown  . omeprazole (PRILOSEC) 20 MG capsule Take 1 capsule by mouth daily.   04/07/2015 at Unknown time  . PROAIR HFA 108 (90 BASE) MCG/ACT inhaler Inhale 2 puffs into the lungs every 6 (six) hours as needed.    unknown  . warfarin (COUMADIN) 4 MG tablet Take 1 tablet (4 mg total) by mouth daily. Take as directed. (Patient taking differently: Take 4 mg by mouth daily. Take as directed. Take 4 mg  on Monday and 7.5 mg Tuesday-Sunday.) 30 tablet 2 Past Week at Unknown time  . warfarin (COUMADIN) 7.5 MG tablet Take 7.5 mg by mouth as directed. Take 4 mg on Monday and 7.5 mg Tuesday-Sunday.   04/06/2015 at Unknown time   Assessment: 80 yo F on chronic Coumadin for Afib.  Home dose listed above- verified with patient that dose is correct and she has not taken dose today.  INR therapeutic on admission and has trended up to upper end of goal range.  Patient with chronic anemia, but no active bleeding noted.   Goal of Therapy:  INR 2-3   Plan:  Coumadin 1mg  po today x 1 (dose reduction to discourage further rise of INR) Daily INR  [place] Crane, Deasia Chiu A 04/08/2015,1:53 PM

## 2015-04-09 ENCOUNTER — Observation Stay (HOSPITAL_COMMUNITY): Payer: Medicare Other

## 2015-04-09 ENCOUNTER — Ambulatory Visit: Payer: Medicare Other | Admitting: Pulmonary Disease

## 2015-04-09 ENCOUNTER — Encounter (HOSPITAL_COMMUNITY): Payer: Self-pay

## 2015-04-09 ENCOUNTER — Other Ambulatory Visit (INDEPENDENT_AMBULATORY_CARE_PROVIDER_SITE_OTHER): Payer: Self-pay | Admitting: *Deleted

## 2015-04-09 ENCOUNTER — Encounter (INDEPENDENT_AMBULATORY_CARE_PROVIDER_SITE_OTHER): Payer: Self-pay | Admitting: *Deleted

## 2015-04-09 DIAGNOSIS — I270 Primary pulmonary hypertension: Secondary | ICD-10-CM

## 2015-04-09 DIAGNOSIS — D509 Iron deficiency anemia, unspecified: Secondary | ICD-10-CM

## 2015-04-09 DIAGNOSIS — R079 Chest pain, unspecified: Secondary | ICD-10-CM | POA: Diagnosis not present

## 2015-04-09 DIAGNOSIS — I482 Chronic atrial fibrillation: Secondary | ICD-10-CM | POA: Diagnosis not present

## 2015-04-09 DIAGNOSIS — I25119 Atherosclerotic heart disease of native coronary artery with unspecified angina pectoris: Secondary | ICD-10-CM | POA: Diagnosis not present

## 2015-04-09 DIAGNOSIS — Z8719 Personal history of other diseases of the digestive system: Secondary | ICD-10-CM

## 2015-04-09 DIAGNOSIS — R072 Precordial pain: Secondary | ICD-10-CM | POA: Diagnosis not present

## 2015-04-09 LAB — NM MYOCAR MULTI W/SPECT W/WALL MOTION / EF
CHL CUP NUCLEAR SSS: 2
CHL CUP RESTING HR STRESS: 54 {beats}/min
LV dias vol: 102 mL
LVSYSVOL: 45 mL
Nuc Stress EF: 56 %
Peak HR: 88 {beats}/min
SDS: 2
SRS: 0
TID: 0.99

## 2015-04-09 LAB — IRON AND TIBC
IRON: 15 ug/dL — AB (ref 28–170)
SATURATION RATIOS: 4 % — AB (ref 10.4–31.8)
TIBC: 405 ug/dL (ref 250–450)
UIBC: 390 ug/dL

## 2015-04-09 LAB — PROTIME-INR
INR: 2.59 — ABNORMAL HIGH (ref 0.00–1.49)
PROTHROMBIN TIME: 27.4 s — AB (ref 11.6–15.2)

## 2015-04-09 LAB — RETICULOCYTES
RBC.: 3.92 MIL/uL (ref 3.87–5.11)
Retic Count, Absolute: 70.6 10*3/uL (ref 19.0–186.0)
Retic Ct Pct: 1.8 % (ref 0.4–3.1)

## 2015-04-09 LAB — VITAMIN B12: Vitamin B-12: 166 pg/mL — ABNORMAL LOW (ref 180–914)

## 2015-04-09 LAB — CBC
HCT: 26.7 % — ABNORMAL LOW (ref 36.0–46.0)
HEMOGLOBIN: 8.2 g/dL — AB (ref 12.0–15.0)
MCH: 20.6 pg — AB (ref 26.0–34.0)
MCHC: 30.7 g/dL (ref 30.0–36.0)
MCV: 66.9 fL — AB (ref 78.0–100.0)
Platelets: 443 10*3/uL — ABNORMAL HIGH (ref 150–400)
RBC: 3.99 MIL/uL (ref 3.87–5.11)
RDW: 20.6 % — ABNORMAL HIGH (ref 11.5–15.5)
WBC: 6.3 10*3/uL (ref 4.0–10.5)

## 2015-04-09 LAB — FERRITIN: Ferritin: 12 ng/mL (ref 11–307)

## 2015-04-09 LAB — PREPARE RBC (CROSSMATCH)

## 2015-04-09 LAB — FOLATE: Folate: 9.6 ng/mL (ref >5.9)

## 2015-04-09 MED ORDER — SODIUM CHLORIDE 0.9 % IV SOLN: Freq: Once | INTRAVENOUS | Status: DC

## 2015-04-09 MED ORDER — REGADENOSON 0.4 MG/5ML IV SOLN
INTRAVENOUS | Status: AC
  Administered 2015-04-09: 0.4 mg via INTRAVENOUS
  Filled 2015-04-09: qty 5

## 2015-04-09 MED ORDER — SODIUM CHLORIDE 0.9 % IJ SOLN
10.0000 mL | INTRAMUSCULAR | Status: DC | PRN
Start: 2015-04-09 — End: 2015-04-10
  Administered 2015-04-09: 10 mL via INTRAVENOUS
  Filled 2015-04-09: qty 10

## 2015-04-09 MED ORDER — WARFARIN SODIUM 4 MG PO TABS: 4.0000 mg | ORAL_TABLET | Freq: Every day | ORAL | Status: DC

## 2015-04-09 MED ORDER — WARFARIN SODIUM 2 MG PO TABS
4.0000 mg | ORAL_TABLET | Freq: Once | ORAL | Status: AC
  Administered 2015-04-09: 4 mg via ORAL
  Filled 2015-04-09: qty 2

## 2015-04-09 MED ORDER — REGADENOSON 0.4 MG/5ML IV SOLN
0.4000 mg | Freq: Once | INTRAVENOUS | Status: AC | PRN
  Filled 2015-04-09: qty 5

## 2015-04-09 MED ORDER — TECHNETIUM TC 99M SESTAMIBI GENERIC - CARDIOLITE
30.0000 | Freq: Once | INTRAVENOUS | Status: AC | PRN
  Administered 2015-04-09: 30 via INTRAVENOUS

## 2015-04-09 MED ORDER — AMLODIPINE BESYLATE 5 MG PO TABS: 5.0000 mg | ORAL_TABLET | Freq: Every day | ORAL | Status: AC

## 2015-04-09 MED ORDER — FERROUS SULFATE 325 (65 FE) MG PO TABS: 325.0000 mg | ORAL_TABLET | Freq: Two times a day (BID) | ORAL | Status: AC

## 2015-04-09 MED ORDER — TECHNETIUM TC 99M SESTAMIBI - CARDIOLITE
10.0000 | Freq: Once | INTRAVENOUS | Status: AC | PRN
  Administered 2015-04-09: 07:00:00 9 via INTRAVENOUS

## 2015-04-09 MED ORDER — SODIUM CHLORIDE 0.9 % IJ SOLN
INTRAMUSCULAR | Status: AC
  Administered 2015-04-09: 10 mL via INTRAVENOUS
  Filled 2015-04-09: qty 3

## 2015-04-09 NOTE — Discharge Summary (Signed)
Physician Discharge Summary  Yesenia Woods KGM:[id number] DOB: 10-26-1935 DOA: 04/07/2015  PCP: Penni Homans, MD  Admit date: 04/07/2015 Discharge date: 04/10/2015  Time spent: 40 minutes  Recommendations for Outpatient Follow-up:  1. Follow up with Dr Rosalita Chessman cardiology in [place] in 1-2 weeks to determine if further cardiac testing needed.  2. Follow up with Dr Elsworth Soho as re-scheduled 3. Continue home oxygen 4. PCP Dr Charlett Blake 1 week for cbc to monitor Hg and follow Vitamin B12 deficiency. Will likely need monthly injections  Discharge Diagnoses:  Principal Problem:   Chest pain Active Problems:   Anemia   CAD (coronary artery disease)   HTN (hypertension)   Iron deficiency anemia   Atrial fibrillation   Pulmonary hypertension   Pain in the chest   Bradycardia   B12 deficiency   IDA (iron deficiency anemia)   Discharge Condition: stable  Diet recommendation: heart healthy  Filed Weights   04/07/15 1120 04/07/15 1707 04/07/15 1728  Weight: 61.689 kg (136 lb) 65.3 kg (143 lb 15.4 oz) 65.3 kg (143 lb 15.4 oz)    History of present illness:   This is a very pleasant 80 year old lady with 1 week history of intermittent central chest pain that presented to ED on 04/07/15. she reported feeling cold but no sweating, nausea . She thought that she had increased dyspnea in the previous week. The pain was a pressure sensation. It did not radiate. She has a previous history of coronary artery disease. She also has history of chronic atrial fibrillation on warfarin therapy. She also has a history of iron deficiency anemia secondary to intermittent/chronic bleed from small bowel angiodysplasia. Hospital Course:   Chest pain. Resolved at discharge.  Reported psychosocial stressors at home. Hx ischemic heart disease. CT angiogram of the chest did not indicate pulmonary embolism. Troponin negative x3. EKG with no acute changes. Evaluated by cardiology who recommend Lexiscan Cardiolite which  was overall low risk study with possible area of mild, mid to inferiour ischemia per cardiology note. Will fax to primary cardiologist in [place] results of echo and stress test.  Recommended close follow up with her primary cardiologist in [place].   Anemia. Chart review indicates Hg trended down somewhat but likely dilutional. On admission was at baseline. She does have a history of intermittent/chronic bleed from small bowel angiodysplasia. She is on coumadin.  She takes iron supplementation and we will increase this to BID. She received 1 unit PRBC's and Hg 9.2 on discharge. Recommend CBC for 1 week to trend hg.   Atrial fibrillation. With slow rate. BB stopped and NTG paste discontinued. Imdur dose adjusted per cardiology and norvasc started per cardiology. On coumadin. Her INR is therapeutic at 2.11 at discharge. Will resume INR check in [place]  Hypertension. On high end of normal. Medications adjusted as noted above. Recommend follow up PCP 1-2 weeks for evaluation of control  Vitamin B12 deficiency: B12 level 166. Provided with 1060mcg of B12 intramuscularly. Will likely need monthly injections. Follow up with PCP for tracking on B12 level  IDA: in setting of small bowel angiodysplasia and on coumadin. Hx of same. Have increased supplement to BID. Follow up with PCP as above   Procedures:  04/09/15 myoview there was no ST segment deviation noted during stress.This is a low risk study. Possible area of mild, mid to apical inferior ischemia. LVEF 56%.  Echo on 04/08/15: Wall thickness was increased in a pattern of mild LVH. The estimated ejection fraction was 55%. There is hypokinesis  of the basalinferiormyocardium. The study is not technically sufficient to allowevaluation of LV diastolic function. Doppler parameters areconsistent with high ventricular filling pressure.  Consultations:  cardiology  Discharge Exam: Filed Vitals:   04/10/15 0831  BP:   Pulse: 76  Temp:    Resp:     General: somewhat frail appearing. Appears comfortable Cardiovascular: irregularly irregular +murmur no LE edema Respiratory: mild increased work of breathing with conversation. BS distant but clear i hear no wheeze or crackles  Discharge Instructions    Current Discharge Medication List    START taking these medications   Details  amLODipine (NORVASC) 5 MG tablet Take 1 tablet (5 mg total) by mouth daily. Qty: 30 tablet, Refills: 1      CONTINUE these medications which have CHANGED   Details  ferrous sulfate 325 (65 FE) MG tablet Take 1 tablet (325 mg total) by mouth 2 (two) times daily with a meal. Qty: 30 tablet, Refills: 3    !! warfarin (COUMADIN) 4 MG tablet Take 1 tablet (4 mg total) by mouth daily. Take as directed. Take 4 mg on Monday and 7.5 mg Tuesday-Sunday.     !! - Potential duplicate medications found. Please discuss with provider.    CONTINUE these medications which have NOT CHANGED   Details  acetaminophen (TYLENOL) 500 MG tablet Take 1,000 mg by mouth daily as needed for moderate pain.    aspirin EC 81 MG tablet Take 81 mg by mouth daily.    BREO ELLIPTA 100-25 MCG/INH AEPB Inhale 1 puff into the lungs daily.     furosemide (LASIX) 40 MG tablet Take 1 tablet (40 mg total) by mouth daily as needed for fluid or edema. Qty: 30 tablet, Refills: 0    hydroxypropyl methylcellulose / hypromellose (ISOPTO TEARS / GONIOVISC) 2.5 % ophthalmic solution Place 1 drop into both eyes daily as needed for dry eyes.    ipratropium-albuterol (DUONEB) 0.5-2.5 (3) MG/3ML SOLN Take 3 mLs by nebulization.    isosorbide mononitrate (IMDUR) 30 MG 24 hr tablet Take 30 mg by mouth 2 (two) times daily.     nitroGLYCERIN (NITROSTAT) 0.4 MG SL tablet Place 1 tablet (0.4 mg total) under the tongue every 5 (five) minutes as needed for chest pain. If no relief after 3 doses to ER Qty: 25 tablet, Refills: 3    omeprazole (PRILOSEC) 20 MG capsule Take 1 capsule by mouth  daily.    PROAIR HFA 108 (90 BASE) MCG/ACT inhaler Inhale 2 puffs into the lungs every 6 (six) hours as needed.     !! warfarin (COUMADIN) 7.5 MG tablet Take 7.5 mg by mouth as directed. Take 4 mg on Monday and 7.5 mg Tuesday-Sunday.     !! - Potential duplicate medications found. Please discuss with provider.    STOP taking these medications     metoprolol (LOPRESSOR) 50 MG tablet        No Known Allergies Follow-up Information    Follow up with ZAKHARY,BOSH, MD On 04/16/2015.   Specialty:  Internal Medicine   Why:  at 10:00 am   Contact information:   [place] [address] [phone]       Follow up with PARRETT,TAMMY, NP On 04/21/2015.   Specialty:  Nurse Practitioner   Why:  at  9:30 am   Contact information:   [address] [place] [address] [phone]       Follow up with Penni Homans, MD On 04/18/2015.   Specialty:  Family  Medicine   Why:  at 11:30 am   Contact information:   [address] [place] [address] [phone]        The results of significant diagnostics from this hospitalization (including imaging, microbiology, ancillary and laboratory) are listed below for reference.    Significant Diagnostic Studies: Dg Chest 2 View  04/07/2015   CLINICAL DATA:  One week history of chest pain and difficulty breathing  EXAM: CHEST  2 VIEW  COMPARISON:  October 14, 2014  FINDINGS: There is underlying emphysematous change. There is no edema or consolidation. Heart is slightly enlarged with pulmonary vascularity within normal limits. No adenopathy. Patient is status post median sternotomy. There is atherosclerotic change in the aorta. No bone lesions.  IMPRESSION: Mild cardiac enlargement. Underlying emphysematous change. No edema or consolidation.   Electronically Signed   By: Lowella Grip III M.D.   On: 04/07/2015 12:26   Ct Angio Chest Pe W/cm &/or Wo Cm  04/07/2015   CLINICAL DATA:  80 year old with chest pain and  shortness of breath for 1 week.  EXAM: CT ANGIOGRAPHY CHEST WITH CONTRAST  TECHNIQUE: Multidetector CT imaging of the chest was performed using the standard protocol during bolus administration of intravenous contrast. Multiplanar CT image reconstructions and MIPs were obtained to evaluate the vascular anatomy.  CONTRAST:  165mL OMNIPAQUE IOHEXOL 350 MG/ML SOLN  COMPARISON:  Chest radiograph 04/07/2015  FINDINGS: Negative for pulmonary embolism. The main pulmonary artery is enlarged measuring 3.4 cm. Right subcarinal tissue measures 1.4 cm in the short axis. Ascending thoracic aorta measures 3.5 cm. Coronary artery calcifications. No significant pericardial or pleural effusions. No acute abnormality in the upper abdomen.  The trachea and mainstem bronchi are patent. There is centrilobular emphysema. There is some mild volume loss along the medial right middle lobe. There is a peripheral nodule in the left lower lobe with a maximum diameter of 5 mm. Nodule is best seen on sequence 5, image 65. No large areas of lung consolidation. Previous median sternotomy. No acute bone abnormality.  Review of the MIP images confirms the above findings.  IMPRESSION: Negative for pulmonary embolism. Enlarged pulmonary arteries are suggestive for pulmonary hypertension.  Emphysema without focal airspace disease or consolidation.  5 mm nodule in the left lower lobe is indeterminate. If the patient is at high risk for bronchogenic carcinoma, follow-up chest CT at 6-12 months is recommended. If the patient is at low risk for bronchogenic carcinoma, follow-up chest CT at 12 months is recommended. This recommendation follows the consensus statement: Guidelines for Management of Small Pulmonary Nodules Detected on CT Scans: A Statement from the [facility] as published in Radiology 2005;237:395-400.   Electronically Signed   By: Markus Daft M.D.   On: 04/07/2015 15:01   Nm Myocar Multi W/spect W/wall Motion / Ef  04/09/2015     There was no ST segment deviation noted during stress.  This is a low risk study.  Possible area of mild, mid to apical inferior ischemia. LVEF 56%.     Microbiology: No results found for this or any previous visit (from the past 240 hour(s)).   Labs: Basic Metabolic Panel:  Recent Labs Lab 04/07/15 1240 04/10/15 0701  NA 141 140  K 3.7 3.7  CL 110 107  CO2 24 25  GLUCOSE 87 87  BUN 14 7  CREATININE 0.67 0.56  CALCIUM 9.4 9.3   Liver Function Tests:  Recent Labs Lab 04/07/15 1240  AST 18  ALT 11*  ALKPHOS 77  BILITOT 1.0  PROT 6.4*  ALBUMIN 3.5   No results for input(s): LIPASE, AMYLASE in the last 168 hours. No results for input(s): AMMONIA in the last 168 hours. CBC:  Recent Labs Lab 04/07/15 1240 04/09/15 0535 04/10/15 0701  WBC 7.4 6.3 7.7  NEUTROABS 4.9  --   --   HGB 9.2* 8.2* 9.4*  HCT 29.9* 26.7* 30.6*  MCV 67.5* 66.9* 68.5*  PLT 491* 443* 391   Cardiac Enzymes:  Recent Labs Lab 04/07/15 1240 04/07/15 2009 04/07/15 2258 04/08/15 0233  TROPONINI <0.03 <0.03 <0.03 <0.03   BNP: BNP (last 3 results)  Recent Labs  04/07/15 1240  BNP 806.0*    ProBNP (last 3 results)  Recent Labs  10/14/14 1143  PROBNP 790.6*    CBG: No results for input(s): GLUCAP in the last 168 hours.     SignedRadene Gunning  [facility] 04/10/2015, 10:38 AM

## 2015-04-09 NOTE — Progress Notes (Signed)
Primary Cardiologist: Dr. Delanna Notice [facility])  Subjective:  No chest pain. Just completed lexiscan myoview.  Objective:  Vital Signs in the last 24 hours: Temp:  [98 F (36.7 C)-98.5 F (36.9 C)] 98.2 F (36.8 C) (05/25 0500) Pulse Rate:  [54-60] 55 (05/25 0500) Resp:  [16-18] 17 (05/25 0500) BP: (156-175)/(56-72) 165/60 mmHg (05/25 0500) SpO2:  [94 %-99 %] 96 % (05/25 0714)  Intake/Output from previous day: 05/24 0701 - 05/25 0700 In: 240 [P.O.:240] Out: -   Physical Exam: NECK: Without JVD, HJR, or bruit LUNGS: Decreased breath sounds but Clear anterior, posterior, lateral HEART: Irregular rate and SLHTDS,2/8 systolic murmur LSB,no gallop, rub, bruit, thrill, or heave EXTREMITIES: Without cyanosis, clubbing, or edema  Lab Results:  Recent Labs  04/07/15 1240 04/09/15 0535  WBC 7.4 6.3  HGB 9.2* 8.2*  PLT 491* 443*    Recent Labs  04/07/15 1240  NA 141  K 3.7  CL 110  CO2 24  GLUCOSE 87  BUN 14  CREATININE 0.67    Recent Labs  04/07/15 2258 04/08/15 0233  TROPONINI <0.03 <0.03    Imaging: Echocardiogram 04/08/2015: Study Conclusions  - Left ventricle: The cavity size was normal. Wall thickness was increased in a pattern of mild LVH. The estimated ejection fraction was 55%. There is hypokinesis of the basalinferior myocardium. The study is not technically sufficient to allow evaluation of LV diastolic function. Doppler parameters are consistent with high ventricular filling pressure. - Aortic valve: Mildly calcified annulus. Trileaflet. There was trivial regurgitation. - Mitral valve: Mildly thickened leaflets. Apparent loose or redundant chordal structure associated with posterior leaflet. There was moderate regurgitation directed posteriorly. Appears to be made up of two jets. - Left atrium: The atrium was moderately to severely dilated. - Right ventricle: Systolic function was mildly reduced. - Right atrium:  The atrium was moderately dilated. Central venous pressure (est): 15 mm Hg. - Tricuspid valve: There was mild regurgitation. - Pulmonary arteries: Systolic pressure was severely increased. PA peak pressure: 75 mm Hg (S). - Pericardium, extracardiac: There was no pericardial effusion.  Impressions:  - Mild LVH with LVEF approximately 55%, basal inferior hypokinesis noted. Indeterminate diastolic function with evidence of increased filling pressures. Moderate to severe left atrial enlargement. Moderate, posteriorly directed mitral regurgitation as outlined above. Mildly reduced RV contraction. Severe pulmonary hypertension with PASP 75 mmHg.   Assessment/Plan:  1. Recurrent chest pain: Cardiac enzymes negative. Await lexiscan myoview results.  2. CAD status post single-vessel CABG at [facility] in 1990 with LIMA to LAD. She is followed by Dr. Rosalita Chessman in [place].  3. Essential hypertension, blood pressure has been elevated during hospital stay. Norvasc 5 mg added.  4. Bradycardia, patient has history of chronic atrial fibrillation on Coumadin and Lopressor which has been 50 mg twice daily at home. Still bradycardic off Lopressor.  5. Oxygen-dependent COPD.  6. Severe pulmonary hypertension. Consider secondary to COPD and potentially also contributed to by mitral regurgitation.    Ermalinda Barrios 04/09/2015, 9:19 AM   Attending note:  Patient seen and examined. Agree with above assessment by Ms. Bonnell Public PA-C, I also amended the note. Ms. Peake is scheduled to complete Lexiscan Myoview today to assess overall ischemic burden. She is stable today, lungs clear and cardiac exam with irregularly irregular rhythm (atrial fibrillation with slow response by telemetry). Echocardiogram is noted above. No high risk ACS by cardiac enzymes. Will leave followup note later today with further recommendations.  Satira Sark, M.D., F.A.C.C.

## 2015-04-09 NOTE — Progress Notes (Signed)
PT Cancellation Note  Patient Details Name: Yesenia Woods MRN: [id number] DOB: 07/30/1935   Cancelled Treatment:    Reason Eval/Treat Not Completed: PT screened, no needs identified, will sign off.  Pt was seen with the intent of doing an evaluation.  She stated that she had just walked up and down the hallway with aides and had no difficulty.  She is feeling well and wants to go home.  I observed her up ambulating in the room with a cane and she has excellent stability and no dyspnea with gait.  She should have no difficulty transitioning to home.   Sable Feil 04/09/2015, 4:19 PM

## 2015-04-09 NOTE — Progress Notes (Signed)
    Primary cardiologist: Dr. Delanna Notice [facility])  I reviewed the Cardiolite from today. Overall a low risk study with possible area of mild, mid to apical inferior ischemia. LVEF 56%. For now would manage medically - Norvasc was added, would also hold beta blocker with slow atrial fibrillation. She needs to followup soon with Dr. Rosalita Chessman to determine if she needs further cardiac testing depending on symptoms. Please make sure that his office gets records including stress test and echocardiogram. We will sign off.  Satira Sark, M.D., F.A.C.C.

## 2015-04-09 NOTE — Progress Notes (Signed)
[place] for Coumadin Indication: atrial fibrillation  No Known Allergies  Patient Measurements: Height: 5' (152.4 cm) Weight: 143 lb 15.4 oz (65.3 kg) IBW/kg (Calculated) : 45.5  Vital Signs: Temp: 98.2 F (36.8 C) (05/25 0500) Temp Source: Oral (05/25 0500) BP: 165/60 mmHg (05/25 0500) Pulse Rate: 55 (05/25 0500)  Labs:  Recent Labs  04/07/15 1240 04/07/15 2009 04/07/15 2258 04/08/15 0233 04/09/15 0535  HGB 9.2*  --   --   --  8.2*  HCT 29.9*  --   --   --  26.7*  PLT 491*  --   --   --  443*  LABPROT 28.0*  --   --  31.3* 27.4*  INR 2.66*  --   --  3.09* 2.59*  CREATININE 0.67  --   --   --   --   TROPONINI <0.03 <0.03 <0.03 <0.03  --    Estimated Creatinine Clearance: 48.9 mL/min (by C-G formula based on Cr of 0.67).  Medical History: Past Medical History  Diagnosis Date  . Essential hypertension   . Anemia   . Headache(784.0)   . Arthritis   . Duodenal ulcer   . Small bowel arteriovenous malformation   . Hyperlipidemia   . COPD (chronic obstructive pulmonary disease)   . Diverticulitis   . Atrial fibrillation   . Oxygen dependent   . CAD (coronary artery disease)     Status post LIMA to LAD at [facility] 04/1989  . Breast cancer 12/09/2014    Mastectomy on left, chemo x 3 doses   Medications:  Prescriptions prior to admission  Medication Sig Dispense Refill Last Dose  . acetaminophen (TYLENOL) 500 MG tablet Take 1,000 mg by mouth daily as needed for moderate pain.   Past Week at Unknown time  . aspirin EC 81 MG tablet Take 81 mg by mouth daily.   04/07/2015 at Unknown time  . BREO ELLIPTA 100-25 MCG/INH AEPB Inhale 1 puff into the lungs daily.    04/07/2015 at Unknown time  . ferrous sulfate 325 (65 FE) MG tablet Take 325 mg by mouth daily with breakfast.   unknown  . furosemide (LASIX) 40 MG tablet Take 1 tablet (40 mg total) by mouth daily as needed for fluid or edema. 30 tablet 0 04/06/2015 at Unknown time  .  hydroxypropyl methylcellulose / hypromellose (ISOPTO TEARS / GONIOVISC) 2.5 % ophthalmic solution Place 1 drop into both eyes daily as needed for dry eyes.   unknown  . ipratropium-albuterol (DUONEB) 0.5-2.5 (3) MG/3ML SOLN Take 3 mLs by nebulization.   unknown  . isosorbide mononitrate (IMDUR) 30 MG 24 hr tablet Take 30 mg by mouth 2 (two) times daily.    04/07/2015 at Unknown time  . metoprolol (LOPRESSOR) 50 MG tablet Take 1 tablet (50 mg total) by mouth 2 (two) times daily. 60 tablet 1 04/07/2015 at 01000  . nitroGLYCERIN (NITROSTAT) 0.4 MG SL tablet Place 1 tablet (0.4 mg total) under the tongue every 5 (five) minutes as needed for chest pain. If no relief after 3 doses to ER 25 tablet 3 unknown  . omeprazole (PRILOSEC) 20 MG capsule Take 1 capsule by mouth daily.   04/07/2015 at Unknown time  . PROAIR HFA 108 (90 BASE) MCG/ACT inhaler Inhale 2 puffs into the lungs every 6 (six) hours as needed.    unknown  . warfarin (COUMADIN) 4 MG tablet Take 1 tablet (4 mg total) by mouth daily. Take as directed. (Patient taking  differently: Take 4 mg by mouth daily. Take as directed. Take 4 mg on Monday and 7.5 mg Tuesday-Sunday.) 30 tablet 2 Past Week at Unknown time  . warfarin (COUMADIN) 7.5 MG tablet Take 7.5 mg by mouth as directed. Take 4 mg on Monday and 7.5 mg Tuesday-Sunday.   04/06/2015 at Unknown time   Assessment: 80 yo F on chronic Coumadin for Afib.  Home dose listed above- verified with patient that dose is correct.  INR therapeutic on admission and has trended up to upper end of goal range.  Patient with chronic anemia, but no active bleeding noted.  H/H has trended down.  Records from Coumadin clinic noted below but it does not match home med list.  Pt reports that home med list is correct.  Anticoagulation Monitoring 02/28/2015  INR goal 2.0-3.0  Assoc. INR Date 02/28/2015  Associated INR 4.6  Pt. deviation No  Current weekly dose 28 mg  Sunday dose 4 mg  Monday dose 4 mg  Tuesday dose 4 mg   Wednesday dose 4 mg  Thursday dose 4 mg  Friday dose 4 mg  Saturday dose 4 mg  Weekly dose 28 mg  Dose description Hold Coumadin tonight, take 4mg  (1 tablet) Sat and Sun.  Then, continue the 6mg  tablets daily.  Recheck in 2 weeks.    Return date 03/14/2015   Goal of Therapy:  INR 2-3   Plan:  Coumadin 4mg  today x 1 Daily INR  [place] Crane, Dianely Krehbiel A 04/09/2015,8:40 AM

## 2015-04-09 NOTE — Progress Notes (Signed)
[facility] PROGRESS NOTE  Yesenia Woods JXB:[id number] DOB: July 04, 1935 DOA: 04/07/2015 PCP: Penni Homans, MD  Assessment/Plan:  Chest pain. Resolved. Reported psychosocial stressors at home. Hx ischemic heart disease. CT angiogram of the chest did not indicate pulmonary embolism. Troponin negative x3. EKG with no acute changes. Evaluated by cardiology who recommend Lexiscan Cardiolite which was overall low risk study with possible area of mild, mid to inferiour ischemia per cardiology note. Will fax to primary cardiologist in [place] results of echo and stress test per cardiology request. Will schedule close follow up with primary cardiologist in [place].   Anemia. Chart review indicates Hg trended down somewhat but may be dilutional. On admission was at baseline. She does have a history of intermittent/chronic bleed from small bowel angiodysplasia and CAD.  She is on coumadin. She takes iron supplementation and we will increase this to BID. Will transfuse 1 unit PRBC's. Recheck in am   Atrial fibrillation. With slow rate. BB stopped and NTG paste discontinued. Imdur dose adjusted per cardiology and norvasc started per cardiology. On coumadin. Her INR is therapeutic at 2.59 today. Monitor  Hypertension. On high end of normal. Medications adjusted as noted above. Plan to  resume home BB at lower dose on discharge.   Code Status: full Family Communication: none Disposition Plan: home when ready   Consultants:  cardiology  Procedures:  Stress test 04/09/15  Antibiotics:  none  HPI/Subjective: Denies chest pain. Denies any discomfort.   Objective: Filed Vitals:   04/09/15 1037  BP: 159/57  Pulse: 62  Temp:   Resp: 18   No intake or output data in the 24 hours ending 04/09/15 1632 Filed Weights   04/07/15 1120 04/07/15 1707 04/07/15 1728  Weight: 61.689 kg (136 lb) 65.3 kg (143 lb 15.4 oz) 65.3 kg (143 lb 15.4 oz)    Exam:   General:  Somewhat thin appears  comfortable  Cardiovascular: irregularly irregular +murmur no LE edema  Respiratory: normal effort BS slightly diminished but clear no wheeze  Abdomen: non-distended non-tender +BS   Musculoskeletal: joints without swelling/erythema   Data Reviewed: Basic Metabolic Panel:  Recent Labs Lab 04/07/15 1240  NA 141  K 3.7  CL 110  CO2 24  GLUCOSE 87  BUN 14  CREATININE 0.67  CALCIUM 9.4   Liver Function Tests:  Recent Labs Lab 04/07/15 1240  AST 18  ALT 11*  ALKPHOS 77  BILITOT 1.0  PROT 6.4*  ALBUMIN 3.5   No results for input(s): LIPASE, AMYLASE in the last 168 hours. No results for input(s): AMMONIA in the last 168 hours. CBC:  Recent Labs Lab 04/07/15 1240 04/09/15 0535  WBC 7.4 6.3  NEUTROABS 4.9  --   HGB 9.2* 8.2*  HCT 29.9* 26.7*  MCV 67.5* 66.9*  PLT 491* 443*   Cardiac Enzymes:  Recent Labs Lab 04/07/15 1240 04/07/15 2009 04/07/15 2258 04/08/15 0233  TROPONINI <0.03 <0.03 <0.03 <0.03   BNP (last 3 results)  Recent Labs  04/07/15 1240  BNP 806.0*    ProBNP (last 3 results)  Recent Labs  10/14/14 1143  PROBNP 790.6*    CBG: No results for input(s): GLUCAP in the last 168 hours.  No results found for this or any previous visit (from the past 240 hour(s)).   Studies: Nm Myocar Multi W/spect W/wall Motion / Ef  04/09/2015    There was no ST segment deviation noted during stress.  This is a low risk study.  Possible area of mild, mid  to apical inferior ischemia. LVEF 56%.     Scheduled Meds: . sodium chloride   Intravenous Once  . amLODipine  5 mg Oral Daily  . ferrous sulfate  325 mg Oral Q breakfast  . ipratropium-albuterol  3 mL Nebulization BID  . isosorbide mononitrate  30 mg Oral BID  . mometasone-formoterol  2 puff Inhalation BID  . pantoprazole  40 mg Oral Daily  . Warfarin - Pharmacist Dosing Inpatient   Does not apply Q24H   Continuous Infusions: . sodium chloride 1,000 mL (04/07/15 1307)    Principal  Problem:   Chest pain Active Problems:   Anemia   CAD (coronary artery disease)   HTN (hypertension)   Iron deficiency anemia   Atrial fibrillation   Pulmonary hypertension    Time spent: 35 minutes    [facility] Pager [phone]. If 7PM-7AM, please contact night-coverage at [email or web address], password [facility] 04/09/2015, 4:32 PM

## 2015-04-10 ENCOUNTER — Encounter (HOSPITAL_COMMUNITY): Payer: Self-pay | Admitting: Internal Medicine

## 2015-04-10 DIAGNOSIS — I270 Primary pulmonary hypertension: Secondary | ICD-10-CM | POA: Diagnosis not present

## 2015-04-10 DIAGNOSIS — J9611 Chronic respiratory failure with hypoxia: Secondary | ICD-10-CM | POA: Diagnosis not present

## 2015-04-10 DIAGNOSIS — R079 Chest pain, unspecified: Secondary | ICD-10-CM | POA: Diagnosis not present

## 2015-04-10 DIAGNOSIS — R001 Bradycardia, unspecified: Secondary | ICD-10-CM | POA: Diagnosis present

## 2015-04-10 DIAGNOSIS — D509 Iron deficiency anemia, unspecified: Secondary | ICD-10-CM | POA: Diagnosis present

## 2015-04-10 DIAGNOSIS — E538 Deficiency of other specified B group vitamins: Secondary | ICD-10-CM | POA: Diagnosis not present

## 2015-04-10 HISTORY — DX: Deficiency of other specified B group vitamins: E53.8

## 2015-04-10 LAB — BASIC METABOLIC PANEL
Anion gap: 8 (ref 5–15)
BUN: 7 mg/dL (ref 6–20)
CALCIUM: 9.3 mg/dL (ref 8.9–10.3)
CHLORIDE: 107 mmol/L (ref 101–111)
CO2: 25 mmol/L (ref 22–32)
CREATININE: 0.56 mg/dL (ref 0.44–1.00)
Glucose, Bld: 87 mg/dL (ref 65–99)
POTASSIUM: 3.7 mmol/L (ref 3.5–5.1)
SODIUM: 140 mmol/L (ref 135–145)

## 2015-04-10 LAB — CBC
HEMATOCRIT: 30.6 % — AB (ref 36.0–46.0)
HEMOGLOBIN: 9.4 g/dL — AB (ref 12.0–15.0)
MCH: 21 pg — AB (ref 26.0–34.0)
MCHC: 30.7 g/dL (ref 30.0–36.0)
MCV: 68.5 fL — ABNORMAL LOW (ref 78.0–100.0)
Platelets: 391 10*3/uL (ref 150–400)
RBC: 4.47 MIL/uL (ref 3.87–5.11)
RDW: 20.7 % — AB (ref 11.5–15.5)
WBC: 7.7 10*3/uL (ref 4.0–10.5)

## 2015-04-10 LAB — PROTIME-INR
INR: 2.11 — ABNORMAL HIGH (ref 0.00–1.49)
PROTHROMBIN TIME: 23.5 s — AB (ref 11.6–15.2)

## 2015-04-10 MED ORDER — CYANOCOBALAMIN 1000 MCG/ML IJ SOLN
1000.0000 ug | Freq: Once | INTRAMUSCULAR | Status: AC
  Administered 2015-04-10: 1000 ug via INTRAMUSCULAR
  Filled 2015-04-10: qty 1

## 2015-04-10 MED ORDER — WARFARIN SODIUM 5 MG PO TABS: 7.5000 mg | ORAL_TABLET | Freq: Once | ORAL | Status: DC

## 2015-04-10 NOTE — Progress Notes (Signed)
Patient states understanding of discharge instructions, prescription given. 

## 2015-04-10 NOTE — Progress Notes (Signed)
[facility] for Coumadin Indication: atrial fibrillation  No Known Allergies  Patient Measurements: Height: 5' (152.4 cm) Weight: 143 lb 15.4 oz (65.3 kg) IBW/kg (Calculated) : 45.5  Vital Signs: Temp: 97.8 F (36.6 C) (05/26 0619) Temp Source: Oral (05/26 0619) BP: 175/67 mmHg (05/26 0619) Pulse Rate: 76 (05/26 0831)  Labs:  Recent Labs  04/07/15 1240 04/07/15 2009 04/07/15 2258 04/08/15 0233 04/09/15 0535 04/10/15 0701  HGB 9.2*  --   --   --  8.2* 9.4*  HCT 29.9*  --   --   --  26.7* 30.6*  PLT 491*  --   --   --  443* 391  LABPROT 28.0*  --   --  31.3* 27.4* 23.5*  INR 2.66*  --   --  3.09* 2.59* 2.11*  CREATININE 0.67  --   --   --   --  0.56  TROPONINI <0.03 <0.03 <0.03 <0.03  --   --    Estimated Creatinine Clearance: 48.9 mL/min (by C-G formula based on Cr of 0.56).  Medical History: Past Medical History  Diagnosis Date  . Essential hypertension   . Anemia   . Headache(784.0)   . Arthritis   . Duodenal ulcer   . Small bowel arteriovenous malformation   . Hyperlipidemia   . COPD (chronic obstructive pulmonary disease)   . Diverticulitis   . Atrial fibrillation   . Oxygen dependent   . CAD (coronary artery disease)     Status post [facility] to LAD at [facility] 04/1989  . Breast cancer 12/09/2014    Mastectomy on left, chemo x 3 doses   Medications:  Prescriptions prior to admission  Medication Sig Dispense Refill Last Dose  . acetaminophen (TYLENOL) 500 MG tablet Take 1,000 mg by mouth daily as needed for moderate pain.   Past Week at Unknown time  . aspirin EC 81 MG tablet Take 81 mg by mouth daily.   04/07/2015 at Unknown time  . BREO ELLIPTA 100-25 MCG/INH AEPB Inhale 1 puff into the lungs daily.    04/07/2015 at Unknown time  . furosemide (LASIX) 40 MG tablet Take 1 tablet (40 mg total) by mouth daily as needed for fluid or edema. 30 tablet 0 04/06/2015 at Unknown time  . hydroxypropyl methylcellulose / hypromellose  (ISOPTO TEARS / GONIOVISC) 2.5 % ophthalmic solution Place 1 drop into both eyes daily as needed for dry eyes.   unknown  . ipratropium-albuterol (DUONEB) 0.5-2.5 (3) MG/3ML SOLN Take 3 mLs by nebulization.   unknown  . isosorbide mononitrate (IMDUR) 30 MG 24 hr tablet Take 30 mg by mouth 2 (two) times daily.    04/07/2015 at Unknown time  . metoprolol (LOPRESSOR) 50 MG tablet Take 1 tablet (50 mg total) by mouth 2 (two) times daily. 60 tablet 1 04/07/2015 at 01000  . nitroGLYCERIN (NITROSTAT) 0.4 MG SL tablet Place 1 tablet (0.4 mg total) under the tongue every 5 (five) minutes as needed for chest pain. If no relief after 3 doses to ER 25 tablet 3 unknown  . omeprazole (PRILOSEC) 20 MG capsule Take 1 capsule by mouth daily.   04/07/2015 at Unknown time  . PROAIR HFA 108 (90 BASE) MCG/ACT inhaler Inhale 2 puffs into the lungs every 6 (six) hours as needed.    unknown  . warfarin (COUMADIN) 7.5 MG tablet Take 7.5 mg by mouth as directed. Take 4 mg on Monday and 7.5 mg Tuesday-Sunday.   04/06/2015 at Unknown time  . [  DISCONTINUED] ferrous sulfate 325 (65 FE) MG tablet Take 325 mg by mouth daily with breakfast.   unknown  . [DISCONTINUED] warfarin (COUMADIN) 4 MG tablet Take 1 tablet (4 mg total) by mouth daily. Take as directed. (Patient taking differently: Take 4 mg by mouth daily. Take as directed. Take 4 mg on Monday and 7.5 mg Tuesday-Sunday.) 30 tablet 2 Past Week at Unknown time   Assessment: 80 yo F on chronic Coumadin for Afib.  Home dose listed above- verified with patient that dose is correct (although this does not match [facility] clinic records).  INR therapeutic on admission.  It trended to upper end of goal range, but is now at lower end after doses lowered.   Patient with chronic anemia.  No active bleeding noted.   Goal of Therapy:  INR 2-3   Plan:  Coumadin 7.5mg  po today x 1 Daily INR  Gwynne Kemnitz, Lavonia Drafts 04/10/2015,8:37 AM

## 2015-04-11 ENCOUNTER — Telehealth: Payer: Self-pay | Admitting: Family Medicine

## 2015-04-11 LAB — TYPE AND SCREEN
ABO/RH(D): O POS
Antibody Screen: POSITIVE
DAT, IGG: NEGATIVE
Donor AG Type: NEGATIVE
UNIT DIVISION: 0

## 2015-04-11 NOTE — Telephone Encounter (Signed)
FYI-  Pt canceled INR 04/15/15 and hospital follow up 04/18/2015. Pt states she has to many doctor appointments and "Dr. Alroy Dust" will due her INR. Pt scheduled hospital follow up July 11th at 11am with MD.

## 2015-04-11 NOTE — Telephone Encounter (Signed)
Noted.    FYI Dr. Charlett Blake- please see below.

## 2015-04-15 ENCOUNTER — Ambulatory Visit: Payer: Medicare Other

## 2015-04-16 DIAGNOSIS — I10 Essential (primary) hypertension: Secondary | ICD-10-CM | POA: Diagnosis not present

## 2015-04-16 DIAGNOSIS — I4891 Unspecified atrial fibrillation: Secondary | ICD-10-CM | POA: Diagnosis not present

## 2015-04-18 ENCOUNTER — Ambulatory Visit: Payer: Medicare Other | Admitting: Physician Assistant

## 2015-04-21 ENCOUNTER — Inpatient Hospital Stay: Payer: Medicare Other | Admitting: Adult Health

## 2015-04-28 DIAGNOSIS — I4891 Unspecified atrial fibrillation: Secondary | ICD-10-CM | POA: Diagnosis not present

## 2015-05-22 DIAGNOSIS — I4891 Unspecified atrial fibrillation: Secondary | ICD-10-CM | POA: Diagnosis not present

## 2015-05-26 ENCOUNTER — Ambulatory Visit (INDEPENDENT_AMBULATORY_CARE_PROVIDER_SITE_OTHER): Payer: Medicare Other | Admitting: Family Medicine

## 2015-05-26 ENCOUNTER — Encounter: Payer: Self-pay | Admitting: Family Medicine

## 2015-05-26 ENCOUNTER — Telehealth: Payer: Self-pay | Admitting: Family Medicine

## 2015-05-26 VITALS — BP 126/72 | HR 55 | Temp 98.0°F | Ht 60.0 in | Wt 146.0 lb

## 2015-05-26 DIAGNOSIS — D500 Iron deficiency anemia secondary to blood loss (chronic): Secondary | ICD-10-CM

## 2015-05-26 DIAGNOSIS — I10 Essential (primary) hypertension: Secondary | ICD-10-CM | POA: Diagnosis not present

## 2015-05-26 DIAGNOSIS — I270 Primary pulmonary hypertension: Secondary | ICD-10-CM

## 2015-05-26 DIAGNOSIS — I2510 Atherosclerotic heart disease of native coronary artery without angina pectoris: Secondary | ICD-10-CM | POA: Diagnosis not present

## 2015-05-26 DIAGNOSIS — E538 Deficiency of other specified B group vitamins: Secondary | ICD-10-CM | POA: Diagnosis not present

## 2015-05-26 DIAGNOSIS — I482 Chronic atrial fibrillation, unspecified: Secondary | ICD-10-CM

## 2015-05-26 DIAGNOSIS — Z7901 Long term (current) use of anticoagulants: Secondary | ICD-10-CM

## 2015-05-26 DIAGNOSIS — I272 Pulmonary hypertension, unspecified: Secondary | ICD-10-CM

## 2015-05-26 LAB — LIPID PANEL
CHOLESTEROL: 145 mg/dL (ref 0–200)
HDL: 54.8 mg/dL (ref >39.00)
LDL CALC: 75 mg/dL (ref 0–99)
NONHDL: 90.2
Total CHOL/HDL Ratio: 3
Triglycerides: 74 mg/dL (ref 0.0–149.0)
VLDL: 14.8 mg/dL (ref 0.0–40.0)

## 2015-05-26 LAB — COMPREHENSIVE METABOLIC PANEL
ALT: 12 U/L (ref 0–35)
AST: 16 U/L (ref 0–37)
Albumin: 3.9 g/dL (ref 3.5–5.2)
Alkaline Phosphatase: 96 U/L (ref 39–117)
BUN: 14 mg/dL (ref 6–23)
CALCIUM: 9.6 mg/dL (ref 8.4–10.5)
CO2: 19 meq/L (ref 19–32)
Chloride: 106 mEq/L (ref 96–112)
Creatinine, Ser: 0.68 mg/dL (ref 0.40–1.20)
GFR: 107.53 mL/min (ref >60.00)
Glucose, Bld: 76 mg/dL (ref 70–99)
POTASSIUM: 3.5 meq/L (ref 3.5–5.1)
SODIUM: 138 meq/L (ref 135–145)
Total Bilirubin: 0.6 mg/dL (ref 0.2–1.2)
Total Protein: 7 g/dL (ref 6.0–8.3)

## 2015-05-26 LAB — CBC
HCT: 24 % — ABNORMAL LOW (ref 36.0–46.0)
Hemoglobin: 7.3 g/dL — CL (ref 12.0–15.0)
MCHC: 30.5 g/dL (ref 30.0–36.0)
PLATELETS: 461 10*3/uL — AB (ref 150.0–400.0)
RBC: 3.53 Mil/uL — AB (ref 3.87–5.11)
RDW: 23 % — ABNORMAL HIGH (ref 11.5–15.5)
WBC: 8.4 10*3/uL (ref 4.0–10.5)

## 2015-05-26 LAB — TSH: TSH: 1.16 u[IU]/mL (ref 0.35–4.50)

## 2015-05-26 LAB — VITAMIN B12

## 2015-05-26 MED ORDER — CYANOCOBALAMIN 1000 MCG/ML IJ SOLN
1000.0000 ug | Freq: Once | INTRAMUSCULAR | Status: AC
  Administered 2015-05-26: 1000 ug via INTRAMUSCULAR

## 2015-05-26 NOTE — Progress Notes (Signed)
Pre visit review using our clinic review tool, if applicable. No additional management support is needed unless otherwise documented below in the visit note. 

## 2015-05-26 NOTE — Telephone Encounter (Signed)
Correct this is new from a recent hospitalization. I have not prescribed before. She got it today

## 2015-05-26 NOTE — Patient Instructions (Signed)
Start a Vitamin B12 tablets 1000 mcg Sublingual/under tongue take one daily   Cyanocobalamin, Vitamin B12 injection What is this medicine? CYANOCOBALAMIN (sye an oh koe BAL a min) is a man made form of vitamin B12. Vitamin B12 is used in the growth of healthy blood cells, nerve cells, and proteins in the body. It also helps with the metabolism of fats and carbohydrates. This medicine is used to treat people who can not absorb vitamin B12. This medicine may be used for other purposes; ask your health care provider or pharmacist if you have questions. COMMON BRAND NAME(S): Cyomin, LA-12, Nutri-Twelve, Primabalt What should I tell my health care provider before I take this medicine? They need to know if you have any of these conditions: -kidney disease -Leber's disease -megaloblastic anemia -an unusual or allergic reaction to cyanocobalamin, cobalt, other medicines, foods, dyes, or preservatives -pregnant or trying to get pregnant -breast-feeding How should I use this medicine? This medicine is injected into a muscle or deeply under the skin. It is usually given by a health care professional in a clinic or doctor's office. However, your doctor may teach you how to inject yourself. Follow all instructions. Talk to your pediatrician regarding the use of this medicine in children. Special care may be needed. Overdosage: If you think you have taken too much of this medicine contact a poison control center or emergency room at once. NOTE: This medicine is only for you. Do not share this medicine with others. What if I miss a dose? If you are given your dose at a clinic or doctor's office, call to reschedule your appointment. If you give your own injections and you miss a dose, take it as soon as you can. If it is almost time for your next dose, take only that dose. Do not take double or extra doses. What may interact with this medicine? -colchicine -heavy alcohol intake This list may not describe  all possible interactions. Give your health care provider a list of all the medicines, herbs, non-prescription drugs, or dietary supplements you use. Also tell them if you smoke, drink alcohol, or use illegal drugs. Some items may interact with your medicine. What should I watch for while using this medicine? Visit your doctor or health care professional regularly. You may need blood work done while you are taking this medicine. You may need to follow a special diet. Talk to your doctor. Limit your alcohol intake and avoid smoking to get the best benefit. What side effects may I notice from receiving this medicine? Side effects that you should report to your doctor or health care professional as soon as possible: -allergic reactions like skin rash, itching or hives, swelling of the face, lips, or tongue -blue tint to skin -chest tightness, pain -difficulty breathing, wheezing -dizziness -red, swollen painful area on the leg Side effects that usually do not require medical attention (report to your doctor or health care professional if they continue or are bothersome): -diarrhea -headache This list may not describe all possible side effects. Call your doctor for medical advice about side effects. You may report side effects to FDA at 1-800-FDA-1088. Where should I keep my medicine? Keep out of the reach of children. Store at room temperature between 15 and 30 degrees C (59 and 85 degrees F). Protect from light. Throw away any unused medicine after the expiration date. NOTE: This sheet is a summary. It may not cover all possible information. If you have questions about this medicine, talk to  your doctor, pharmacist, or health care provider.  2015, Elsevier/Gold Standard. (2008-02-12 22:10:20)

## 2015-05-26 NOTE — Assessment & Plan Note (Signed)
Hospitalized in [place] in May and has struggled with increased SOB since the hospitalization. She had moved in with her daughter but that did not work out due to her daughter's lifestyle, she has moved back to [place]

## 2015-05-26 NOTE — Telephone Encounter (Signed)
Patient mentioned to me she received B12 injections, but I do not see on her medication list and thought she would have discussed with you.  Advise please.

## 2015-05-26 NOTE — Telephone Encounter (Signed)
Pt ensuring B12 injection sent to  [facility] [address], [place] - [place] [phone] (Phone) [phone] (Fax)

## 2015-05-26 NOTE — Assessment & Plan Note (Signed)
Well controlled, no changes to meds. Encouraged heart healthy diet such as the DASH diet and exercise as tolerated.  °

## 2015-05-27 LAB — INTRINSIC FACTOR ANTIBODIES: Intrinsic Factor: POSITIVE — AB

## 2015-05-27 NOTE — Telephone Encounter (Signed)
Updated medication list

## 2015-05-28 ENCOUNTER — Telehealth: Payer: Self-pay

## 2015-05-28 NOTE — Telephone Encounter (Signed)
OK, please see if gastro is willing to give the shots due to logistics

## 2015-05-28 NOTE — Telephone Encounter (Signed)
Pt notified and states hematology will not give her injections asked if we would call Gast[facility] Dr. Laural Golden to see if he is willing.

## 2015-05-28 NOTE — Telephone Encounter (Signed)
-----   Message from Mosie Lukes, MD sent at 05/28/2015 12:23 AM EDT ----- Notify intrinsic factor is positive s oshe really needs the vit b 12 shots she should likely increase the frequency of the shots temporarily to once weekly to help her anemia improve and help her feel better. The problem is she has moved back to [place] and is hard for her to get here. Her hematologist (who is listed in her care team) is located in [place] and I suspect they would be willing to give her the shots. Please speak to the nurse at that office and explain the situation and see if they are willing to help and then forward them the results if so. THX

## 2015-05-29 ENCOUNTER — Encounter: Payer: Self-pay | Admitting: Family Medicine

## 2015-05-29 ENCOUNTER — Other Ambulatory Visit: Payer: Self-pay | Admitting: Family Medicine

## 2015-05-29 DIAGNOSIS — D510 Vitamin B12 deficiency anemia due to intrinsic factor deficiency: Secondary | ICD-10-CM

## 2015-05-29 NOTE — Telephone Encounter (Signed)
GI called back and they do not do injections.  The patient would need to contact her PCP in [place] for injections as they always forward those request to PCP's.

## 2015-05-30 ENCOUNTER — Other Ambulatory Visit: Payer: Self-pay | Admitting: Family Medicine

## 2015-05-30 DIAGNOSIS — D509 Iron deficiency anemia, unspecified: Secondary | ICD-10-CM

## 2015-05-30 NOTE — Telephone Encounter (Signed)
OK then I want a vitamin B12 1000 mcg IM shot weekly x 6 weeks then 1 sho every other week x 4 weeks then monthly. Next week when she comes in I also need a CBC, reticulocyte count for anemia

## 2015-05-30 NOTE — Telephone Encounter (Signed)
Called left message to call back 

## 2015-05-30 NOTE — Telephone Encounter (Signed)
Patient returning your call.

## 2015-05-30 NOTE — Telephone Encounter (Signed)
Called the patient informed of PCP instructions.  Scheduled the patient for a nurse visit on Tuesday 06/03/15 at 10:30 for her B12 injection and lab appt. At 10 for labs PCP ordered.

## 2015-05-30 NOTE — Telephone Encounter (Signed)
Labs entered.

## 2015-05-30 NOTE — Telephone Encounter (Signed)
Pt called and said she can find transportation to come to the office for her B12 injection.  Please advise

## 2015-05-30 NOTE — Telephone Encounter (Signed)
Patient informed. 

## 2015-06-03 ENCOUNTER — Other Ambulatory Visit (INDEPENDENT_AMBULATORY_CARE_PROVIDER_SITE_OTHER): Payer: Medicare Other

## 2015-06-03 ENCOUNTER — Ambulatory Visit (INDEPENDENT_AMBULATORY_CARE_PROVIDER_SITE_OTHER): Payer: Medicare Other

## 2015-06-03 DIAGNOSIS — D509 Iron deficiency anemia, unspecified: Secondary | ICD-10-CM

## 2015-06-03 DIAGNOSIS — E538 Deficiency of other specified B group vitamins: Secondary | ICD-10-CM | POA: Diagnosis not present

## 2015-06-03 LAB — CBC WITH DIFFERENTIAL/PLATELET
BASOS PCT: 0 % (ref 0.0–3.0)
Basophils Absolute: 0 10*3/uL (ref 0.0–0.1)
Eosinophils Absolute: 0.1 10*3/uL (ref 0.0–0.7)
Eosinophils Relative: 1 % (ref 0.0–5.0)
HCT: 24.1 % — ABNORMAL LOW (ref 36.0–46.0)
Hemoglobin: 7.4 g/dL — CL (ref 12.0–15.0)
LYMPHS ABS: 1.1 10*3/uL (ref 0.7–4.0)
LYMPHS PCT: 16.2 % (ref 12.0–46.0)
MCHC: 30.7 g/dL (ref 30.0–36.0)
MCV: 66.7 fl — ABNORMAL LOW (ref 78.0–100.0)
MONO ABS: 0.5 10*3/uL (ref 0.1–1.0)
MONOS PCT: 8 % (ref 3.0–12.0)
Neutro Abs: 5 10*3/uL (ref 1.4–7.7)
Neutrophils Relative %: 74.8 % (ref 43.0–77.0)
PLATELETS: 561 10*3/uL — AB (ref 150.0–400.0)
RBC: 3.62 Mil/uL — AB (ref 3.87–5.11)
RDW: 24.2 % — AB (ref 11.5–15.5)
WBC: 6.7 10*3/uL (ref 4.0–10.5)

## 2015-06-03 MED ORDER — CYANOCOBALAMIN 1000 MCG/ML IJ SOLN
1000.0000 ug | Freq: Once | INTRAMUSCULAR | Status: AC
  Administered 2015-06-03: 1000 ug via INTRAMUSCULAR

## 2015-06-03 NOTE — Progress Notes (Signed)
Pre visit review using our clinic review tool, if applicable. No additional management support is needed unless otherwise documented below in the visit note.  Patient tolerated injection well.  Next injection scheduled for 06/10/15.

## 2015-06-04 ENCOUNTER — Encounter (HOSPITAL_COMMUNITY): Payer: Self-pay | Admitting: Cardiology

## 2015-06-04 ENCOUNTER — Emergency Department (HOSPITAL_COMMUNITY)
Admission: EM | Admit: 2015-06-04 | Discharge: 2015-06-04 | Disposition: A | Discharge status: Home / Self Care | Payer: Medicare Other | Attending: Emergency Medicine | Admitting: Emergency Medicine

## 2015-06-04 ENCOUNTER — Other Ambulatory Visit: Payer: Self-pay | Admitting: Family Medicine

## 2015-06-04 DIAGNOSIS — Z87891 Personal history of nicotine dependence: Secondary | ICD-10-CM | POA: Insufficient documentation

## 2015-06-04 DIAGNOSIS — Z7982 Long term (current) use of aspirin: Secondary | ICD-10-CM | POA: Diagnosis not present

## 2015-06-04 DIAGNOSIS — I10 Essential (primary) hypertension: Secondary | ICD-10-CM | POA: Diagnosis not present

## 2015-06-04 DIAGNOSIS — K269 Duodenal ulcer, unspecified as acute or chronic, without hemorrhage or perforation: Secondary | ICD-10-CM | POA: Diagnosis not present

## 2015-06-04 DIAGNOSIS — Q2733 Arteriovenous malformation of digestive system vessel: Secondary | ICD-10-CM | POA: Diagnosis not present

## 2015-06-04 DIAGNOSIS — Z79899 Other long term (current) drug therapy: Secondary | ICD-10-CM | POA: Insufficient documentation

## 2015-06-04 DIAGNOSIS — D649 Anemia, unspecified: Secondary | ICD-10-CM | POA: Insufficient documentation

## 2015-06-04 DIAGNOSIS — J441 Chronic obstructive pulmonary disease with (acute) exacerbation: Secondary | ICD-10-CM | POA: Insufficient documentation

## 2015-06-04 DIAGNOSIS — Z951 Presence of aortocoronary bypass graft: Secondary | ICD-10-CM | POA: Insufficient documentation

## 2015-06-04 DIAGNOSIS — M1990 Unspecified osteoarthritis, unspecified site: Secondary | ICD-10-CM | POA: Diagnosis not present

## 2015-06-04 DIAGNOSIS — Z853 Personal history of malignant neoplasm of breast: Secondary | ICD-10-CM | POA: Insufficient documentation

## 2015-06-04 DIAGNOSIS — E538 Deficiency of other specified B group vitamins: Secondary | ICD-10-CM | POA: Insufficient documentation

## 2015-06-04 DIAGNOSIS — D509 Iron deficiency anemia, unspecified: Secondary | ICD-10-CM | POA: Diagnosis not present

## 2015-06-04 DIAGNOSIS — Z7901 Long term (current) use of anticoagulants: Secondary | ICD-10-CM | POA: Diagnosis not present

## 2015-06-04 DIAGNOSIS — I2510 Atherosclerotic heart disease of native coronary artery without angina pectoris: Secondary | ICD-10-CM | POA: Diagnosis not present

## 2015-06-04 DIAGNOSIS — R7989 Other specified abnormal findings of blood chemistry: Secondary | ICD-10-CM | POA: Diagnosis present

## 2015-06-04 LAB — VITAMIN B12: VITAMIN B 12: 2010 pg/mL — AB (ref 180–914)

## 2015-06-04 LAB — BASIC METABOLIC PANEL
Anion gap: 8 (ref 5–15)
BUN: 10 mg/dL (ref 6–20)
CALCIUM: 9.6 mg/dL (ref 8.9–10.3)
CO2: 24 mmol/L (ref 22–32)
CREATININE: 0.69 mg/dL (ref 0.44–1.00)
Chloride: 107 mmol/L (ref 101–111)
GFR calc Af Amer: >60 mL/min (ref >60)
GLUCOSE: 89 mg/dL (ref 65–99)
POTASSIUM: 3.8 mmol/L (ref 3.5–5.1)
Sodium: 139 mmol/L (ref 135–145)

## 2015-06-04 LAB — RETICULOCYTES
ABS Retic: 101.6 10*3/uL (ref 19.0–186.0)
RBC.: 3.66 MIL/uL — AB (ref 3.87–5.11)
RBC.: 3.63 MIL/uL — AB (ref 3.87–5.11)
RETIC COUNT ABSOLUTE: 95.2 10*3/uL (ref 19.0–186.0)
Retic Ct Pct: 2.6 % (ref 0.4–3.1)
Retic Ct Pct: 2.8 % — ABNORMAL HIGH (ref 0.4–2.3)

## 2015-06-04 LAB — CBC WITH DIFFERENTIAL/PLATELET
BASOS PCT: 1 % (ref 0–1)
Basophils Absolute: 0 10*3/uL (ref 0.0–0.1)
EOS PCT: 2 % (ref 0–5)
Eosinophils Absolute: 0.1 10*3/uL (ref 0.0–0.7)
HCT: 24.3 % — ABNORMAL LOW (ref 36.0–46.0)
Hemoglobin: 7.4 g/dL — ABNORMAL LOW (ref 12.0–15.0)
LYMPHS PCT: 12 % (ref 12–46)
Lymphs Abs: 0.9 10*3/uL (ref 0.7–4.0)
MCH: 20.1 pg — ABNORMAL LOW (ref 26.0–34.0)
MCHC: 30.5 g/dL (ref 30.0–36.0)
MCV: 66 fL — ABNORMAL LOW (ref 78.0–100.0)
MONOS PCT: 12 % (ref 3–12)
Monocytes Absolute: 0.9 10*3/uL (ref 0.1–1.0)
NEUTROS ABS: 5.5 10*3/uL (ref 1.7–7.7)
Neutrophils Relative %: 74 % (ref 43–77)
Platelets: 577 10*3/uL — ABNORMAL HIGH (ref 150–400)
RBC: 3.68 MIL/uL — ABNORMAL LOW (ref 3.87–5.11)
RDW: 21 % — AB (ref 11.5–15.5)
WBC: 7.5 10*3/uL (ref 4.0–10.5)

## 2015-06-04 LAB — IRON AND TIBC
Iron: 21 ug/dL — ABNORMAL LOW (ref 28–170)
Saturation Ratios: 5 % — ABNORMAL LOW (ref 10.4–31.8)
TIBC: 466 ug/dL — ABNORMAL HIGH (ref 250–450)
UIBC: 445 ug/dL

## 2015-06-04 LAB — PROTIME-INR
INR: 2.41 — AB (ref 0.00–1.49)
Prothrombin Time: 26 seconds — ABNORMAL HIGH (ref 11.6–15.2)

## 2015-06-04 LAB — FERRITIN: Ferritin: 8 ng/mL — ABNORMAL LOW (ref 11–307)

## 2015-06-04 LAB — FOLATE: FOLATE: 11.7 ng/mL (ref >5.9)

## 2015-06-04 LAB — PREPARE RBC (CROSSMATCH)

## 2015-06-04 MED ORDER — SODIUM CHLORIDE 0.9 % IV SOLN: Freq: Once | INTRAVENOUS | Status: DC

## 2015-06-04 NOTE — ED Notes (Signed)
IV left in place due to patient convenience and Doctor approval. Patient will return to Short stay for Blood transfusion tomorrow morning.

## 2015-06-04 NOTE — ED Provider Notes (Signed)
CSN: [id number]     Arrival date & time 06/04/15  1111 History  This chart was scribed for Jola Schmidt, MD by Julien Nordmann, ED Scribe. This patient was seen in room APA10/APA10 and the patient's care was started at 12:01 PM.    Chief Complaint  Patient presents with  . Abnormal Lab      The history is provided by the patient. No language interpreter was used.   HPI Comments: Frank Pilger Hoefer is a 80 y.o. female who has a hx of breast cancer, CAD, and Afib presents to the Emergency Department complaining of an abnormal lab onset yesterday. Pt was seen by her PCP and was told to come to the ED to be evaluated due to low blood counts.She reports having previous intermittent blood transfusion. Pt reports having associated gradual worsening shortness of breath for the past month. She is currently on Coumadin for anticoagulation for atrial fibrillation.  She has a long-standing history of chronic slow GI blood loss.  She denies melena or hematochezia.  No hematemesis.  No nausea or vomiting.  Her shortness of breath is very mild.  No cough, fevers, chills.  She spoken in the past with her physicians regarding stopping her anticoagulation given her intermittent need for blood transfusions and at this time she reports that they want to continue her anticoagulation..  Past Medical History  Diagnosis Date  . Essential hypertension   . Anemia   . Headache(784.0)   . Arthritis   . Duodenal ulcer   . Small bowel arteriovenous malformation   . Hyperlipidemia   . COPD (chronic obstructive pulmonary disease)   . Diverticulitis   . Atrial fibrillation   . Oxygen dependent   . CAD (coronary artery disease)     Status post LIMA to LAD at [facility] 04/1989  . Breast cancer 12/09/2014    Mastectomy on left, chemo x 3 doses  . B12 deficiency 04/10/2015  . Chronic respiratory failure with hypoxia    Past Surgical History  Procedure Laterality Date  . Coronary artery bypass graft  1990    [facility] - LIMA to LAD   . Eye surgery    . Mastectomy Left   . Hand surgery Right   . Esophagogastroduodenoscopy  07/22/2011    Procedure: ESOPHAGOGASTRODUODENOSCOPY (EGD);  Surgeon: Rogene Houston, MD;  [facility];  Service: Endoscopy;  Laterality: N/A;  . Colonoscopy  07/22/2011    Procedure: COLONOSCOPY;  Surgeon: Rogene Houston, MD;  [facility];  Service: Endoscopy;  Laterality: N/A;  . Givens capsule study  07/30/2011    Procedure: GIVENS CAPSULE STUDY;  Surgeon: Rogene Houston, MD;  [facility];  Service: Endoscopy;  Laterality: N/A;  7:30  . Esophagogastroduodenoscopy N/A 04/23/2013    Procedure: ESOPHAGOGASTRODUODENOSCOPY (EGD);  Surgeon: Rogene Houston, MD;  [facility];  Service: Endoscopy;  Laterality: N/A;   Family History  Problem Relation Age of Onset  . Prostate cancer Brother   . Prostate cancer Brother   . Breast cancer Daughter     Breast Cancer that mets  . Throat cancer Daughter   . Healthy Daughter   . Healthy Son   . Diabetes Son   . Hypertension Son   . Healthy Son   . Healthy Son   . Healthy Son   . Heart disease Son     Cardiac arrythmia, with defibrillator  . Healthy Son   . Prostate cancer Son   .  CAD Son    History  Substance Use Topics  . Smoking status: Former Smoker -- 1.00 packs/day for 30 years    Types: Cigarettes    Quit date: 10/14/1994  . Smokeless tobacco: Never Used     Comment: Patient quit smoking 25 years ago  . Alcohol Use: No     Comment: none for 8 months, wine occasionally   OB History    No data available     Review of Systems  A complete 10 system review of systems was obtained and all systems are negative except as noted in the HPI and PMH.    Allergies  Review of patient's allergies indicates no known allergies.  Home Medications   Prior to Admission medications   Medication Sig Start Date End Date Taking? Authorizing Provider  acetaminophen (TYLENOL) 500 MG tablet Take 1,000 mg by  mouth daily as needed for moderate pain.    Historical Provider, MD  amLODipine (NORVASC) 5 MG tablet Take 1 tablet (5 mg total) by mouth daily. 04/09/15   Radene Gunning, NP  aspirin EC 81 MG tablet Take 81 mg by mouth daily.    Historical Provider, MD  BREO ELLIPTA 100-25 MCG/INH AEPB Inhale 1 puff into the lungs daily.  06/21/14   Historical Provider, MD  cyanocobalamin (,VITAMIN B-12,) 1000 MCG/ML injection Inject 1,000 mcg into the muscle once.    Historical Provider, MD  ferrous sulfate 325 (65 FE) MG tablet Take 1 tablet (325 mg total) by mouth 2 (two) times daily with a meal. 04/09/15   Radene Gunning, NP  furosemide (LASIX) 40 MG tablet Take 1 tablet (40 mg total) by mouth daily as needed for fluid or edema. 10/16/14   Radene Gunning, NP  hydroxypropyl methylcellulose / hypromellose (ISOPTO TEARS / GONIOVISC) 2.5 % ophthalmic solution Place 1 drop into both eyes daily as needed for dry eyes.    Historical Provider, MD  ipratropium-albuterol (DUONEB) 0.5-2.5 (3) MG/3ML SOLN Take 3 mLs by nebulization.    Historical Provider, MD  isosorbide mononitrate (IMDUR) 30 MG 24 hr tablet Take 30 mg by mouth 2 (two) times daily.  10/12/14   Historical Provider, MD  nitroGLYCERIN (NITROSTAT) 0.4 MG SL tablet Place 1 tablet (0.4 mg total) under the tongue every 5 (five) minutes as needed for chest pain. If no relief after 3 doses to ER 03/13/15   Mosie Lukes, MD  omeprazole (PRILOSEC) 20 MG capsule Take 1 capsule by mouth daily. 12/12/14   Historical Provider, MD  PROAIR HFA 108 (90 BASE) MCG/ACT inhaler Inhale 2 puffs into the lungs every 6 (six) hours as needed.  09/03/14   Historical Provider, MD  warfarin (COUMADIN) 4 MG tablet Take 1 tablet (4 mg total) by mouth daily. Take as directed. Take 4 mg on Monday and 7.5 mg Tuesday-Sunday. 04/09/15   Radene Gunning, NP  warfarin (COUMADIN) 7.5 MG tablet Take 7.5 mg by mouth as directed. Take 4 mg on Monday and 7.5 mg Tuesday-Sunday.    Historical Provider, MD    Triage vitals: BP 143/42 mmHg  Pulse 60  Temp(Src) 97.7 F (36.5 C) (Oral)  Resp 13  Ht 5' (1.524 m)  Wt 143 lb (64.864 kg)  BMI 27.93 kg/m2  SpO2 99% Physical Exam  Constitutional: She is oriented to person, place, and time. She appears well-developed and well-nourished. No distress.  HENT:  Head: Normocephalic and atraumatic.  Eyes: EOM are normal.  Neck: Normal range of motion.  Cardiovascular:  Normal rate, regular rhythm and normal heart sounds.   Pulmonary/Chest: Effort normal and breath sounds normal.  Abdominal: Soft. She exhibits no distension. There is no tenderness.  Musculoskeletal: Normal range of motion.  Neurological: She is alert and oriented to person, place, and time.  Skin: Skin is warm and dry.  Psychiatric: She has a normal mood and affect. Judgment normal.  Nursing note and vitals reviewed.   ED Course  Procedures  DIAGNOSTIC STUDIES: Oxygen Saturation is 99% on RA, normal by my interpretation.  COORDINATION OF CARE:  12:07 PM Discussed treatment plan which includes lab work follow up with cardiologist with pt at bedside and pt agreed to plan.  Labs Review Labs Reviewed  CBC WITH DIFFERENTIAL/PLATELET - Abnormal; Notable for the following:    RBC 3.68 (*)    Hemoglobin 7.4 (*)    HCT 24.3 (*)    MCV 66.0 (*)    MCH 20.1 (*)    RDW 21.0 (*)    Platelets 577 (*)    All other components within normal limits  RETICULOCYTES - Abnormal; Notable for the following:    RBC. 3.66 (*)    All other components within normal limits  PROTIME-INR - Abnormal; Notable for the following:    Prothrombin Time 26.0 (*)    INR 2.41 (*)    All other components within normal limits  BASIC METABOLIC PANEL  VITAMIN I01  FOLATE  IRON AND TIBC  FERRITIN  TYPE AND SCREEN  PREPARE RBC (CROSSMATCH)    Imaging Review No results found.   EKG Interpretation None      MDM   Final diagnoses:  Anemia, unspecified anemia type    Patient has multiple  antibodies.  I do not think she needs to be admitted the hospital.  I do think she'll benefit from blood transfusion.  I have arranged for her to receive a blood transfusion of 2 units packed red blood cells tomorrow as an outpatient in the [facility] short stay area.  This is been coordinated with nursing staff and short stay.  I've asked the patient were to develop any symptoms during her transfusion to be transferred back to the emergency department for repeat evaluation.  She is scheduled to see her primary care doctor in 6 days.  She'll need repeat check her hemoglobin at that time.  Overall the patient is well-appearing  I personally performed the services described in this documentation, which was scribed in my presence. The recorded information has been reviewed and is accurate.     Jola Schmidt, MD 06/04/15 1415

## 2015-06-04 NOTE — ED Notes (Addendum)
Was called from PCP office and told to come to the hospital for blood.   Checked schedule and called short stay and pt is not scheduled as an outpatient for blood.

## 2015-06-04 NOTE — Addendum Note (Signed)
Addended by: Modena Morrow D on: 06/04/2015 11:36 AM   Modules accepted: Orders

## 2015-06-04 NOTE — Discharge Instructions (Signed)
Anemia, Nonspecific Anemia is a condition in which the concentration of red blood cells or hemoglobin in the blood is below normal. Hemoglobin is a substance in red blood cells that carries oxygen to the tissues of the body. Anemia results in not enough oxygen reaching these tissues.  CAUSES  Common causes of anemia include:   Excessive bleeding. Bleeding may be internal or external. This includes excessive bleeding from periods (in women) or from the intestine.   Poor nutrition.   Chronic kidney, thyroid, and liver disease.  Bone marrow disorders that decrease red blood cell production.  Cancer and treatments for cancer.  HIV, AIDS, and their treatments.  Spleen problems that increase red blood cell destruction.  Blood disorders.  Excess destruction of red blood cells due to infection, medicines, and autoimmune disorders. SIGNS AND SYMPTOMS   Minor weakness.   Dizziness.   Headache.  Palpitations.   Shortness of breath, especially with exercise.   Paleness.  Cold sensitivity.  Indigestion.  Nausea.  Difficulty sleeping.  Difficulty concentrating. Symptoms may occur suddenly or they may develop slowly.  DIAGNOSIS  Additional blood tests are often needed. These help your health care provider determine the best treatment. Your health care provider will check your stool for blood and look for other causes of blood loss.  TREATMENT  Treatment varies depending on the cause of the anemia. Treatment can include:   Supplements of iron, vitamin B12, or folic acid.   Hormone medicines.   A blood transfusion. This may be needed if blood loss is severe.   Hospitalization. This may be needed if there is significant continual blood loss.   Dietary changes.  Spleen removal. HOME CARE INSTRUCTIONS Keep all follow-up appointments. It often takes many weeks to correct anemia, and having your health care provider check on your condition and your response to  treatment is very important. SEEK IMMEDIATE MEDICAL CARE IF:   You develop extreme weakness, shortness of breath, or chest pain.   You become dizzy or have trouble concentrating.  You develop heavy vaginal bleeding.   You develop a rash.   You have bloody or black, tarry stools.   You faint.   You vomit up blood.   You vomit repeatedly.   You have abdominal pain.  You have a fever or persistent symptoms for more than 2-3 days.   You have a fever and your symptoms suddenly get worse.   You are dehydrated.  MAKE SURE YOU:  Understand these instructions.  Will watch your condition.  Will get help right away if you are not doing well or get worse. Document Released: 12/09/2004 Document Revised: 07/04/2013 Document Reviewed: 04/27/2013 ExitCare Patient Information 2015 [employer]. This information is not intended to replace advice given to you by your health care provider. Make sure you discuss any questions you have with your health care provider.  

## 2015-06-05 ENCOUNTER — Encounter (HOSPITAL_COMMUNITY)
Admit: 2015-06-05 | Discharge: 2015-06-05 | Disposition: A | Discharge status: Home / Self Care | Payer: Medicare Other | Source: Ambulatory Visit | Attending: Emergency Medicine | Admitting: Emergency Medicine

## 2015-06-05 DIAGNOSIS — D500 Iron deficiency anemia secondary to blood loss (chronic): Secondary | ICD-10-CM | POA: Diagnosis not present

## 2015-06-05 MED ORDER — SODIUM CHLORIDE 0.9 % IV SOLN
Freq: Once | INTRAVENOUS | Status: AC
  Administered 2015-06-05: 10:00:00 via INTRAVENOUS

## 2015-06-06 LAB — TYPE AND SCREEN
ABO/RH(D): O POS
ANTIBODY SCREEN: POSITIVE
DAT, IgG: NEGATIVE
Donor AG Type: NEGATIVE
Donor AG Type: NEGATIVE
Unit division: 0
Unit division: 0

## 2015-06-08 ENCOUNTER — Encounter: Payer: Self-pay | Admitting: Family Medicine

## 2015-06-08 NOTE — Assessment & Plan Note (Signed)
Rate controlled 

## 2015-06-08 NOTE — Assessment & Plan Note (Signed)
Multifactorial, iron deficiency and vitamin B12 continue shots and will likely need transfusion which she has had in past

## 2015-06-08 NOTE — Assessment & Plan Note (Signed)
Continue shots, IF positive, increase shots to weekly temporarily and then maintain monthly, she has moved and we will attempt to find her somewhere closer to her home for shots.

## 2015-06-08 NOTE — Assessment & Plan Note (Signed)
May need to consider stopping coumadin due to recurrent anemia

## 2015-06-08 NOTE — Progress Notes (Signed)
Yesenia Woods  [id number] [birth year] 06/08/2015      Progress Note-Follow Up  Subjective  Chief Complaint  Chief Complaint  Patient presents with  . Hospitalization Follow-up    HPI  Patient is a 80 y.o. female in today for routine medical care. Patient in today reporting some fatigue. Has significant SOB at baseline but acknowledges some increase especially with exertion. No recent acute illness. Denies any bloody or tarry stool. Denies CP/palp/SOB/HA/congestion/fevers/GI or GU c/o. Taking meds as prescribed  Past Medical History  Diagnosis Date  . Essential hypertension   . Anemia   . Headache(784.0)   . Arthritis   . Duodenal ulcer   . Small bowel arteriovenous malformation   . Hyperlipidemia   . COPD (chronic obstructive pulmonary disease)   . Diverticulitis   . Atrial fibrillation   . Oxygen dependent   . CAD (coronary artery disease)     Status post LIMA to LAD at [facility] 04/1989  . Breast cancer 12/09/2014    Mastectomy on left, chemo x 3 doses  . B12 deficiency 04/10/2015  . Chronic respiratory failure with hypoxia     Past Surgical History  Procedure Laterality Date  . Coronary artery bypass graft  1990    [facility] - LIMA to LAD  . Eye surgery    . Mastectomy Left   . Hand surgery Right   . Esophagogastroduodenoscopy  07/22/2011    Procedure: ESOPHAGOGASTRODUODENOSCOPY (EGD);  Surgeon: Rogene Houston, MD;  [facility];  Service: Endoscopy;  Laterality: N/A;  . Colonoscopy  07/22/2011    Procedure: COLONOSCOPY;  Surgeon: Rogene Houston, MD;  [facility];  Service: Endoscopy;  Laterality: N/A;  . Givens capsule study  07/30/2011    Procedure: GIVENS CAPSULE STUDY;  Surgeon: Rogene Houston, MD;  [facility];  Service: Endoscopy;  Laterality: N/A;  7:30  . Esophagogastroduodenoscopy N/A 04/23/2013    Procedure: ESOPHAGOGASTRODUODENOSCOPY (EGD);  Surgeon: Rogene Houston, MD;  [facility];  Service: Endoscopy;   Laterality: N/A;    Family History  Problem Relation Age of Onset  . Prostate cancer Brother   . Prostate cancer Brother   . Breast cancer Daughter     Breast Cancer that mets  . Throat cancer Daughter   . Healthy Daughter   . Healthy Son   . Diabetes Son   . Hypertension Son   . Healthy Son   . Healthy Son   . Healthy Son   . Heart disease Son     Cardiac arrythmia, with defibrillator  . Healthy Son   . Prostate cancer Son   . CAD Son     History   Social History  . Marital Status: Widowed    Spouse Name: N/A  . Number of Children: N/A  . Years of Education: N/A   Occupational History  . Not on file.   Social History Main Topics  . Smoking status: Former Smoker -- 1.00 packs/day for 30 years    Types: Cigarettes    Quit date: 10/14/1994  . Smokeless tobacco: Never Used     Comment: Patient quit smoking 25 years ago  . Alcohol Use: No     Comment: none for 8 months, wine occasionally  . Drug Use: No  . 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    Other Topics Concern  . Not on file   Social  History Narrative    Current Outpatient Prescriptions on File Prior to Visit  Medication Sig Dispense Refill  . acetaminophen (TYLENOL) 500 MG tablet Take 1,000 mg by mouth daily as needed for moderate pain.    Marland Kitchen amLODipine (NORVASC) 5 MG tablet Take 1 tablet (5 mg total) by mouth daily. 30 tablet 1  . aspirin EC 81 MG tablet Take 81 mg by mouth daily.    Marland Kitchen BREO ELLIPTA 100-25 MCG/INH AEPB Inhale 1 puff into the lungs daily.     . ferrous sulfate 325 (65 FE) MG tablet Take 1 tablet (325 mg total) by mouth 2 (two) times daily with a meal. (Patient taking differently: Take 325 mg by mouth daily with breakfast. ) 30 tablet 3  . furosemide (LASIX) 40 MG tablet Take 1 tablet (40 mg total) by mouth daily as needed for fluid or edema. 30 tablet 0  . hydroxypropyl methylcellulose / hypromellose (ISOPTO TEARS /  GONIOVISC) 2.5 % ophthalmic solution Place 1 drop into both eyes daily as needed for dry eyes.    Marland Kitchen ipratropium-albuterol (DUONEB) 0.5-2.5 (3) MG/3ML SOLN Take 3 mLs by nebulization every 6 (six) hours as needed (shortness of breath).     . isosorbide mononitrate (IMDUR) 30 MG 24 hr tablet Take 30 mg by mouth 2 (two) times daily.     . nitroGLYCERIN (NITROSTAT) 0.4 MG SL tablet Place 1 tablet (0.4 mg total) under the tongue every 5 (five) minutes as needed for chest pain. If no relief after 3 doses to ER 25 tablet 3  . omeprazole (PRILOSEC) 20 MG capsule Take 1 capsule by mouth daily.    Marland Kitchen PROAIR HFA 108 (90 BASE) MCG/ACT inhaler Inhale 2 puffs into the lungs every 6 (six) hours as needed for wheezing or shortness of breath.     . warfarin (COUMADIN) 4 MG tablet Take 1 tablet (4 mg total) by mouth daily. Take as directed. Take 4 mg on Monday and 7.5 mg Tuesday-Sunday. (Patient taking differently: Take 4 mg by mouth daily. Patient takes on Tuesday and Thursday)    . warfarin (COUMADIN) 7.5 MG tablet Take 7.5 mg by mouth daily. Patient take on Monday,Wednesday,Friday,Saturday,Sunday     No current facility-administered medications on file prior to visit.    No Known Allergies  Review of Systems  Review of Systems  Constitutional: Positive for malaise/fatigue. Negative for fever.  HENT: Negative for congestion.   Eyes: Negative for discharge.  Respiratory: Positive for shortness of breath.   Cardiovascular: Negative for chest pain, palpitations and leg swelling.  Gastrointestinal: Negative for nausea, abdominal pain and diarrhea.  Genitourinary: Negative for dysuria.  Musculoskeletal: Negative for falls.  Skin: Negative for rash.  Neurological: Negative for loss of consciousness and headaches.  Endo/Heme/Allergies: Negative for polydipsia.  Psychiatric/Behavioral: Negative for depression and suicidal ideas. The patient is not nervous/anxious and does not have insomnia.     Objective  BP  126/72 mmHg  Pulse 55  Temp(Src) 98 F (36.7 C) (Oral)  Ht 5' (1.524 m)  Wt 146 lb (66.225 kg)  BMI 28.51 kg/m2  SpO2 96%  Physical Exam  Physical Exam  Constitutional: She is oriented to person, place, and time and well-developed, well-nourished, and in no distress. No distress.  HENT:  Head: Normocephalic and atraumatic.  Eyes: Conjunctivae are normal.  Neck: Neck supple. No thyromegaly present.  Cardiovascular:  Murmur heard. Mild bradycardia, irregular  Pulmonary/Chest: Effort normal and breath sounds normal. She has no wheezes.  Abdominal: She exhibits no  distension and no mass.  Musculoskeletal: She exhibits no edema.  Lymphadenopathy:    She has no cervical adenopathy.  Neurological: She is alert and oriented to person, place, and time.  Skin: Skin is warm and dry. No rash noted. She is not diaphoretic.  Psychiatric: Memory, affect and judgment normal.    Lab Results  Component Value Date   TSH 1.16 05/26/2015   Lab Results  Component Value Date   WBC 7.5 06/04/2015   HGB 7.4* 06/04/2015   HCT 24.3* 06/04/2015   MCV 66.0* 06/04/2015   PLT 577* 06/04/2015   Lab Results  Component Value Date   CREATININE 0.69 06/04/2015   BUN 10 06/04/2015   NA 139 06/04/2015   K 3.8 06/04/2015   CL 107 06/04/2015   CO2 24 06/04/2015   Lab Results  Component Value Date   ALT 12 05/26/2015   AST 16 05/26/2015   ALKPHOS 96 05/26/2015   BILITOT 0.6 05/26/2015   Lab Results  Component Value Date   CHOL 145 05/26/2015   Lab Results  Component Value Date   HDL 54.80 05/26/2015   Lab Results  Component Value Date   LDLCALC 75 05/26/2015   Lab Results  Component Value Date   TRIG 74.0 05/26/2015   Lab Results  Component Value Date   CHOLHDL 3 05/26/2015     Assessment & Plan  Pulmonary hypertension Hospitalized in [place] in May and has struggled with increased SOB since the hospitalization. She had moved in with her daughter but that did not work out  due to her daughter's lifestyle, she has moved back to [place]  HTN (hypertension) Well controlled, no changes to meds. Encouraged heart healthy diet such as the DASH diet and exercise as tolerated.   B12 deficiency Continue shots, IF positive, increase shots to weekly temporarily and then maintain monthly, she has moved and we will attempt to find her somewhere closer to her home for shots.  Anemia Multifactorial, iron deficiency and vitamin B12 continue shots and will likely need transfusion which she has had in past  Atrial fibrillation Rate controlled  Anticoagulated on warfarin May need to consider stopping coumadin due to recurrent anemia

## 2015-06-10 ENCOUNTER — Ambulatory Visit (INDEPENDENT_AMBULATORY_CARE_PROVIDER_SITE_OTHER): Payer: Medicare Other | Admitting: *Deleted

## 2015-06-10 DIAGNOSIS — E538 Deficiency of other specified B group vitamins: Secondary | ICD-10-CM | POA: Diagnosis not present

## 2015-06-10 MED ORDER — CYANOCOBALAMIN 1000 MCG/ML IJ SOLN
1000.0000 ug | Freq: Once | INTRAMUSCULAR | Status: AC
  Administered 2015-06-10: 1000 ug via INTRAMUSCULAR

## 2015-06-10 NOTE — Progress Notes (Signed)
Pre visit review using our clinic review tool, if applicable. No additional management support is needed unless otherwise documented below in the visit note.  Patient tolerated injection well.  Next injection scheduled for 06/17/15 at 9:45 am.

## 2015-06-12 DIAGNOSIS — I482 Chronic atrial fibrillation: Secondary | ICD-10-CM | POA: Diagnosis not present

## 2015-06-17 ENCOUNTER — Ambulatory Visit (INDEPENDENT_AMBULATORY_CARE_PROVIDER_SITE_OTHER): Payer: Medicare Other | Admitting: *Deleted

## 2015-06-17 DIAGNOSIS — E538 Deficiency of other specified B group vitamins: Secondary | ICD-10-CM | POA: Diagnosis not present

## 2015-06-17 MED ORDER — CYANOCOBALAMIN 1000 MCG/ML IJ SOLN
1000.0000 ug | Freq: Once | INTRAMUSCULAR | Status: AC
  Administered 2015-06-17: 1000 ug via INTRAMUSCULAR

## 2015-06-17 NOTE — Progress Notes (Signed)
Pre visit review using our clinic review tool, if applicable. No additional management support is needed unless otherwise documented below in the visit note.  Patient tolerated injection well.  Next injection scheduled for 06/24/15 at 9:45 AM.

## 2015-06-24 ENCOUNTER — Ambulatory Visit (INDEPENDENT_AMBULATORY_CARE_PROVIDER_SITE_OTHER): Payer: Medicare Other

## 2015-06-24 DIAGNOSIS — E538 Deficiency of other specified B group vitamins: Secondary | ICD-10-CM | POA: Diagnosis not present

## 2015-06-26 DIAGNOSIS — J9611 Chronic respiratory failure with hypoxia: Secondary | ICD-10-CM | POA: Diagnosis not present

## 2015-06-26 DIAGNOSIS — I4891 Unspecified atrial fibrillation: Secondary | ICD-10-CM | POA: Diagnosis not present

## 2015-06-26 DIAGNOSIS — I10 Essential (primary) hypertension: Secondary | ICD-10-CM | POA: Diagnosis not present

## 2015-06-26 DIAGNOSIS — J449 Chronic obstructive pulmonary disease, unspecified: Secondary | ICD-10-CM | POA: Diagnosis not present

## 2015-07-01 ENCOUNTER — Ambulatory Visit (INDEPENDENT_AMBULATORY_CARE_PROVIDER_SITE_OTHER): Payer: Medicare Other | Admitting: *Deleted

## 2015-07-01 DIAGNOSIS — E538 Deficiency of other specified B group vitamins: Secondary | ICD-10-CM | POA: Diagnosis not present

## 2015-07-01 MED ORDER — CYANOCOBALAMIN 1000 MCG/ML IJ SOLN
1000.0000 ug | Freq: Once | INTRAMUSCULAR | Status: AC
  Administered 2015-07-01: 1000 ug via INTRAMUSCULAR

## 2015-07-01 NOTE — Progress Notes (Signed)
Pre visit review using our clinic review tool, if applicable. No additional management support is needed unless otherwise documented below in the visit note.  Patient tolerated injection well.  Next injection scheduled 07/08/15 per patient preference (last weekly).

## 2015-07-08 ENCOUNTER — Ambulatory Visit (INDEPENDENT_AMBULATORY_CARE_PROVIDER_SITE_OTHER): Payer: Medicare Other

## 2015-07-08 DIAGNOSIS — E538 Deficiency of other specified B group vitamins: Secondary | ICD-10-CM

## 2015-07-14 DIAGNOSIS — I4891 Unspecified atrial fibrillation: Secondary | ICD-10-CM | POA: Diagnosis not present

## 2015-07-22 ENCOUNTER — Ambulatory Visit: Payer: Medicare Other

## 2015-07-22 DIAGNOSIS — E538 Deficiency of other specified B group vitamins: Secondary | ICD-10-CM

## 2015-07-22 NOTE — Progress Notes (Signed)
Pre visit review using our clinic review tool, if applicable. No additional management support is needed unless otherwise documented below in the visit note.  Patient given B 12 injection Right deltoid (Patient preference). Patient tolerated well.

## 2015-07-29 DIAGNOSIS — D649 Anemia, unspecified: Secondary | ICD-10-CM | POA: Diagnosis not present

## 2015-07-29 DIAGNOSIS — I2510 Atherosclerotic heart disease of native coronary artery without angina pectoris: Secondary | ICD-10-CM | POA: Diagnosis not present

## 2015-07-29 DIAGNOSIS — I10 Essential (primary) hypertension: Secondary | ICD-10-CM | POA: Diagnosis not present

## 2015-07-29 DIAGNOSIS — J449 Chronic obstructive pulmonary disease, unspecified: Secondary | ICD-10-CM | POA: Diagnosis not present

## 2015-07-29 DIAGNOSIS — Q2730 Arteriovenous malformation, site unspecified: Secondary | ICD-10-CM | POA: Diagnosis not present

## 2015-07-29 DIAGNOSIS — I4891 Unspecified atrial fibrillation: Secondary | ICD-10-CM | POA: Diagnosis not present

## 2015-07-29 DIAGNOSIS — R0902 Hypoxemia: Secondary | ICD-10-CM | POA: Diagnosis not present

## 2015-08-05 ENCOUNTER — Ambulatory Visit (INDEPENDENT_AMBULATORY_CARE_PROVIDER_SITE_OTHER): Payer: Medicare Other

## 2015-08-05 DIAGNOSIS — D519 Vitamin B12 deficiency anemia, unspecified: Secondary | ICD-10-CM | POA: Diagnosis not present

## 2015-08-05 MED ORDER — CYANOCOBALAMIN 1000 MCG/ML IJ SOLN
1000.0000 ug | Freq: Once | INTRAMUSCULAR | Status: AC
  Administered 2015-08-05: 1000 ug via INTRAMUSCULAR

## 2015-08-05 NOTE — Progress Notes (Addendum)
Pre visit review using our clinic review tool, if applicable. No additional management support is needed unless otherwise documented below in the visit note.  Administered B12 injection to right deltoid. Patient tolerated injection well

## 2015-08-06 DIAGNOSIS — I4891 Unspecified atrial fibrillation: Secondary | ICD-10-CM | POA: Diagnosis not present

## 2015-08-19 ENCOUNTER — Ambulatory Visit (INDEPENDENT_AMBULATORY_CARE_PROVIDER_SITE_OTHER): Payer: Medicare Other

## 2015-08-19 DIAGNOSIS — E538 Deficiency of other specified B group vitamins: Secondary | ICD-10-CM | POA: Diagnosis not present

## 2015-08-21 DIAGNOSIS — I4891 Unspecified atrial fibrillation: Secondary | ICD-10-CM | POA: Diagnosis not present

## 2015-08-22 ENCOUNTER — Telehealth: Payer: Self-pay | Admitting: *Deleted

## 2015-08-22 NOTE — Telephone Encounter (Signed)
Pt signed ROI received via fax from [facility]. Forwarded to [place] to scan/email to medical records. JG//CMA

## 2015-08-29 ENCOUNTER — Ambulatory Visit (INDEPENDENT_AMBULATORY_CARE_PROVIDER_SITE_OTHER): Payer: Medicare Other | Admitting: Family Medicine

## 2015-08-29 ENCOUNTER — Encounter: Payer: Self-pay | Admitting: Family Medicine

## 2015-08-29 ENCOUNTER — Telehealth: Payer: Self-pay

## 2015-08-29 VITALS — BP 161/60 | HR 73 | Temp 98.5°F | Wt 143.0 lb

## 2015-08-29 DIAGNOSIS — Z5181 Encounter for therapeutic drug level monitoring: Secondary | ICD-10-CM | POA: Diagnosis not present

## 2015-08-29 DIAGNOSIS — Z7901 Long term (current) use of anticoagulants: Secondary | ICD-10-CM

## 2015-08-29 DIAGNOSIS — I4891 Unspecified atrial fibrillation: Secondary | ICD-10-CM | POA: Diagnosis not present

## 2015-08-29 DIAGNOSIS — D649 Anemia, unspecified: Secondary | ICD-10-CM | POA: Diagnosis not present

## 2015-08-29 DIAGNOSIS — E538 Deficiency of other specified B group vitamins: Secondary | ICD-10-CM | POA: Diagnosis not present

## 2015-08-29 DIAGNOSIS — I10 Essential (primary) hypertension: Secondary | ICD-10-CM

## 2015-08-29 DIAGNOSIS — K219 Gastro-esophageal reflux disease without esophagitis: Secondary | ICD-10-CM

## 2015-08-29 DIAGNOSIS — I2510 Atherosclerotic heart disease of native coronary artery without angina pectoris: Secondary | ICD-10-CM | POA: Diagnosis not present

## 2015-08-29 DIAGNOSIS — Z23 Encounter for immunization: Secondary | ICD-10-CM

## 2015-08-29 DIAGNOSIS — J439 Emphysema, unspecified: Secondary | ICD-10-CM

## 2015-08-29 LAB — LIPID PANEL
CHOL/HDL RATIO: 2
Cholesterol: 145 mg/dL (ref 0–200)
HDL: 61.6 mg/dL (ref >39.00)
LDL Cholesterol: 71 mg/dL (ref 0–99)
NonHDL: 83.39
TRIGLYCERIDES: 64 mg/dL (ref 0.0–149.0)
VLDL: 12.8 mg/dL (ref 0.0–40.0)

## 2015-08-29 LAB — CBC
HCT: 27 % — ABNORMAL LOW (ref 36.0–46.0)
Hemoglobin: 8 g/dL — CL (ref 12.0–15.0)
MCHC: 29.6 g/dL — ABNORMAL LOW (ref 30.0–36.0)
MCV: 59 fl — AB (ref 78.0–100.0)
Platelets: 510 10*3/uL — ABNORMAL HIGH (ref 150.0–400.0)
RBC: 4.57 Mil/uL (ref 3.87–5.11)
RDW: 23.4 % — AB (ref 11.5–15.5)
WBC: 6.2 10*3/uL (ref 4.0–10.5)

## 2015-08-29 LAB — COMPREHENSIVE METABOLIC PANEL
ALK PHOS: 85 U/L (ref 39–117)
ALT: 9 U/L (ref 0–35)
AST: 13 U/L (ref 0–37)
Albumin: 3.9 g/dL (ref 3.5–5.2)
BILIRUBIN TOTAL: 0.9 mg/dL (ref 0.2–1.2)
BUN: 10 mg/dL (ref 6–23)
CALCIUM: 9.9 mg/dL (ref 8.4–10.5)
CO2: 24 meq/L (ref 19–32)
CREATININE: 0.71 mg/dL (ref 0.40–1.20)
Chloride: 106 mEq/L (ref 96–112)
GFR: 102.23 mL/min (ref >60.00)
GLUCOSE: 98 mg/dL (ref 70–99)
Potassium: 3.8 mEq/L (ref 3.5–5.1)
Sodium: 140 mEq/L (ref 135–145)
Total Protein: 7.5 g/dL (ref 6.0–8.3)

## 2015-08-29 LAB — TSH: TSH: 1.36 u[IU]/mL (ref 0.35–4.50)

## 2015-08-29 LAB — VITAMIN B12: VITAMIN B 12: 776 pg/mL (ref 211–911)

## 2015-08-29 MED ORDER — CYANOCOBALAMIN 1000 MCG/ML IJ SOLN
1000.0000 ug | Freq: Once | INTRAMUSCULAR | Status: AC
  Administered 2016-04-27: 1000 ug via INTRAMUSCULAR

## 2015-08-29 NOTE — Patient Instructions (Signed)
Hypertension Hypertension, commonly called high blood pressure, is when the force of blood pumping through your arteries is too strong. Your arteries are the blood vessels that carry blood from your heart throughout your body. A blood pressure reading consists of a higher number over a lower number, such as 110/72. The higher number (systolic) is the pressure inside your arteries when your heart pumps. The lower number (diastolic) is the pressure inside your arteries when your heart relaxes. Ideally you want your blood pressure below 120/80. Hypertension forces your heart to work harder to pump blood. Your arteries may become narrow or stiff. Having untreated or uncontrolled hypertension can cause heart attack, stroke, kidney disease, and other problems. RISK FACTORS Some risk factors for high blood pressure are controllable. Others are not.  Risk factors you cannot control include:   Race. You may be at higher risk if you are African American.  Age. Risk increases with age.  Gender. Men are at higher risk than women before age 45 years. After age 65, women are at higher risk than men. Risk factors you can control include:  Not getting enough exercise or physical activity.  Being overweight.  Getting too much fat, sugar, calories, or salt in your diet.  Drinking too much alcohol. SIGNS AND SYMPTOMS Hypertension does not usually cause signs or symptoms. Extremely high blood pressure (hypertensive crisis) may cause headache, anxiety, shortness of breath, and nosebleed. DIAGNOSIS To check if you have hypertension, your health care provider will measure your blood pressure while you are seated, with your arm held at the level of your heart. It should be measured at least twice using the same arm. Certain conditions can cause a difference in blood pressure between your right and left arms. A blood pressure reading that is higher than normal on one occasion does not mean that you need treatment. If  it is not clear whether you have high blood pressure, you may be asked to return on a different day to have your blood pressure checked again. Or, you may be asked to monitor your blood pressure at home for 1 or more weeks. TREATMENT Treating high blood pressure includes making lifestyle changes and possibly taking medicine. Living a healthy lifestyle can help lower high blood pressure. You may need to change some of your habits. Lifestyle changes may include:  Following the DASH diet. This diet is high in fruits, vegetables, and whole grains. It is low in salt, red meat, and added sugars.  Keep your sodium intake below 2,300 mg per day.  Getting at least 30-45 minutes of aerobic exercise at least 4 times per week.  Losing weight if necessary.  Not smoking.  Limiting alcoholic beverages.  Learning ways to reduce stress. Your health care provider may prescribe medicine if lifestyle changes are not enough to get your blood pressure under control, and if one of the following is true:  You are 18-59 years of age and your systolic blood pressure is above 140.  You are 60 years of age or older, and your systolic blood pressure is above 150.  Your diastolic blood pressure is above 90.  You have diabetes, and your systolic blood pressure is over 140 or your diastolic blood pressure is over 90.  You have kidney disease and your blood pressure is above 140/90.  You have heart disease and your blood pressure is above 140/90. Your personal target blood pressure may vary depending on your medical conditions, your age, and other factors. HOME CARE INSTRUCTIONS    Have your blood pressure rechecked as directed by your health care provider.   Take medicines only as directed by your health care provider. Follow the directions carefully. Blood pressure medicines must be taken as prescribed. The medicine does not work as well when you skip doses. Skipping doses also puts you at risk for  problems.  Do not smoke.   Monitor your blood pressure at home as directed by your health care provider. SEEK MEDICAL CARE IF:   You think you are having a reaction to medicines taken.  You have recurrent headaches or feel dizzy.  You have swelling in your ankles.  You have trouble with your vision. SEEK IMMEDIATE MEDICAL CARE IF:  You develop a severe headache or confusion.  You have unusual weakness, numbness, or feel faint.  You have severe chest or abdominal pain.  You vomit repeatedly.  You have trouble breathing. MAKE SURE YOU:   Understand these instructions.  Will watch your condition.  Will get help right away if you are not doing well or get worse.   This information is not intended to replace advice given to you by your health care provider. Make sure you discuss any questions you have with your health care provider.   Document Released: 11/01/2005 Document Revised: 03/18/2015 Document Reviewed: 08/24/2013 Elsevier Interactive Patient Education 2016 Elsevier Inc.  

## 2015-08-29 NOTE — Progress Notes (Signed)
Pre visit review using our clinic review tool, if applicable. No additional management support is needed unless otherwise documented below in the visit note. 

## 2015-08-29 NOTE — Telephone Encounter (Signed)
Called patient to try to get her to go to ER because she has Critical Hgb of 8.0. Patient states she has no transportation. States she can go on Monday. Advised patient She needs to address this ASAP. Patient asked that arrangement be se up for Monday.

## 2015-08-29 NOTE — Telephone Encounter (Signed)
See note from phone conversation with patient

## 2015-09-01 ENCOUNTER — Telehealth: Payer: Self-pay | Admitting: Family Medicine

## 2015-09-01 ENCOUNTER — Encounter (HOSPITAL_COMMUNITY)
Admission: RE | Admit: 2015-09-01 | Discharge: 2015-09-01 | Disposition: A | Discharge status: Home / Self Care | Payer: Medicare Other | Source: Ambulatory Visit | Attending: Family Medicine | Admitting: Family Medicine

## 2015-09-01 DIAGNOSIS — D649 Anemia, unspecified: Secondary | ICD-10-CM | POA: Insufficient documentation

## 2015-09-01 DIAGNOSIS — Z79899 Other long term (current) drug therapy: Secondary | ICD-10-CM | POA: Diagnosis not present

## 2015-09-01 LAB — CBC
HCT: 27.4 % — ABNORMAL LOW (ref 36.0–46.0)
HEMOGLOBIN: 8 g/dL — AB (ref 12.0–15.0)
MCH: 17.1 pg — AB (ref 26.0–34.0)
MCHC: 29.2 g/dL — ABNORMAL LOW (ref 30.0–36.0)
MCV: 58.7 fL — AB (ref 78.0–100.0)
Platelets: 489 10*3/uL — ABNORMAL HIGH (ref 150–400)
RBC: 4.67 MIL/uL (ref 3.87–5.11)
RDW: 21.2 % — ABNORMAL HIGH (ref 11.5–15.5)
WBC: 6.7 10*3/uL (ref 4.0–10.5)

## 2015-09-01 LAB — PREPARE RBC (CROSSMATCH)

## 2015-09-01 NOTE — Telephone Encounter (Signed)
[facility] called to inform the patient has arrived.  They will send over an order for this patient to have PCP sign. Spoke to Jackson at short stay and she will take care of faxing order to [phone]. Once received order will have PCP sign and fax back to them

## 2015-09-01 NOTE — Telephone Encounter (Signed)
Caller name:Rana Relationship to patient:self Can be reached:[phone] Pharmacy:  Reason for call:she is on the way to [place] to get blood  Please call the patient and advise what she is to do

## 2015-09-01 NOTE — Telephone Encounter (Signed)
Caller name: Scarlett  Relation to pt:  Registrar Forestine Na  Call back number: [phone]     Reason for call:  Has a few questions regarding patient

## 2015-09-01 NOTE — Telephone Encounter (Signed)
Called the patient informed to go to [place], but the patient stated her insurance will not pay for an ER visit.  She stated she will just go as planned on Friday to day surgery and will have them call our office for questions.

## 2015-09-02 ENCOUNTER — Encounter (HOSPITAL_COMMUNITY)
Admission: RE | Admit: 2015-09-02 | Discharge: 2015-09-02 | Disposition: A | Discharge status: Home / Self Care | Payer: Medicare Other | Source: Ambulatory Visit | Attending: Family Medicine | Admitting: Family Medicine

## 2015-09-02 DIAGNOSIS — Z79899 Other long term (current) drug therapy: Secondary | ICD-10-CM | POA: Diagnosis not present

## 2015-09-02 DIAGNOSIS — D649 Anemia, unspecified: Secondary | ICD-10-CM | POA: Diagnosis not present

## 2015-09-02 LAB — GLUCOSE, CAPILLARY: Glucose-Capillary: 90 mg/dL (ref 65–99)

## 2015-09-02 MED ORDER — SODIUM CHLORIDE 0.9 % IV SOLN
Freq: Once | INTRAVENOUS | Status: AC
  Administered 2015-09-02: 250 mL via INTRAVENOUS

## 2015-09-02 NOTE — Progress Notes (Signed)
Talked with soon Herbie [place]. Informed him mother was ready to go home.

## 2015-09-02 NOTE — Progress Notes (Signed)
Results for Yesenia, Woods (MRN [id number]) as of 09/02/2015 08:45  Ref. Range 09/01/2015 09:32  WBC Latest Ref Range: 4.0-10.5 K/uL 6.7  RBC Latest Ref Range: 3.87-5.11 MIL/uL 4.67  Hemoglobin Latest Ref Range: 12.0-15.0 g/dL 8.0 (L)  HCT Latest Ref Range: 36.0-46.0 % 27.4 (L)  MCV Latest Ref Range: 78.0-100.0 fL 58.7 (L)  MCH Latest Ref Range: 26.0-34.0 pg 17.1 (L)  MCHC Latest Ref Range: 30.0-36.0 g/dL 29.2 (L)  RDW Latest Ref Range: 11.5-15.5 % 21.2 (H)  Platelets Latest Ref Range: 150-400 K/uL 489 (H)

## 2015-09-02 NOTE — Progress Notes (Signed)
Continues waiting on ride.

## 2015-09-02 NOTE — Progress Notes (Signed)
Son here. Pt d/c to home in good condition.

## 2015-09-03 LAB — TYPE AND SCREEN
ABO/RH(D): O POS
Antibody Screen: POSITIVE
DAT, IGG: NEGATIVE
DONOR AG TYPE: NEGATIVE
Donor AG Type: NEGATIVE
UNIT DIVISION: 0
Unit division: 0

## 2015-09-07 ENCOUNTER — Encounter: Payer: Self-pay | Admitting: Family Medicine

## 2015-09-07 NOTE — Assessment & Plan Note (Signed)
Avoid offending foods, start probiotics. Do not eat large meals in late evening and consider raising head of bed.  

## 2015-09-07 NOTE — Assessment & Plan Note (Signed)
Is struggling with anemia and fatigue today, hgb returns at 8 she is asked to present for care but declines. She agrees to proceed next week as outpatient and to seek care if worsens.

## 2015-09-07 NOTE — Assessment & Plan Note (Signed)
Doing well, no recent exacerbation 

## 2015-09-07 NOTE — Assessment & Plan Note (Signed)
Overly treated will drop frequency of treatments

## 2015-09-07 NOTE — Assessment & Plan Note (Signed)
Elevated today but she has not taken meds today

## 2015-09-07 NOTE — Assessment & Plan Note (Signed)
Rate controlled, on coumadin but may need to consider stopping coumadin due to recurrent anemia

## 2015-09-07 NOTE — Progress Notes (Signed)
Subjective:    Patient ID: Yesenia Woods, female    DOB: 10-19-1935, 80 y.o.   MRN: [id number]  Chief Complaint  Patient presents with  . Follow-up    HPI Patient is in today for follow-up. She is struggling with fatigue and some nausea. No recent fevers or chills. No bloody or tarry stool. Bowels are moving freely. Denies CP/palp/SOB/HA/congestion/fevers/GI or GU c/o. Taking meds as prescribed  Past Medical History  Diagnosis Date  . Essential hypertension   . Anemia   . Headache(784.0)   . Arthritis   . Duodenal ulcer   . Small bowel arteriovenous malformation   . Hyperlipidemia   . COPD (chronic obstructive pulmonary disease) ([place])   . Diverticulitis   . Atrial fibrillation ([place])   . Oxygen dependent   . CAD (coronary artery disease)     Status post LIMA to LAD at [facility] 04/1989  . Breast cancer ([place]) 12/09/2014    Mastectomy on left, chemo x 3 doses  . B12 deficiency 04/10/2015  . Chronic respiratory failure with hypoxia [facility])     Past Surgical History  Procedure Laterality Date  . Coronary artery bypass graft  1990    [facility] - LIMA to LAD  . Eye surgery    . Mastectomy Left   . Hand surgery Right   . Esophagogastroduodenoscopy  07/22/2011    Procedure: ESOPHAGOGASTRODUODENOSCOPY (EGD);  Surgeon: Rogene Houston, MD;  Location: [facility];  Service: Endoscopy;  Laterality: N/A;  . Colonoscopy  07/22/2011    Procedure: COLONOSCOPY;  Surgeon: Rogene Houston, MD;  Location: [facility];  Service: Endoscopy;  Laterality: N/A;  . Givens capsule study  07/30/2011    Procedure: GIVENS CAPSULE STUDY;  Surgeon: Rogene Houston, MD;  Location: [facility];  Service: Endoscopy;  Laterality: N/A;  7:30  . Esophagogastroduodenoscopy N/A 04/23/2013    Procedure: ESOPHAGOGASTRODUODENOSCOPY (EGD);  Surgeon: Rogene Houston, MD;  Location: [facility];  Service: Endoscopy;  Laterality: N/A;    Family History  Problem Relation Age of Onset  . Prostate cancer Brother   .  Prostate cancer Brother   . Breast cancer Daughter     Breast Cancer that mets  . Throat cancer Daughter   . Healthy Daughter   . Healthy Son   . Diabetes Son   . Hypertension Son   . Healthy Son   . Healthy Son   . Healthy Son   . Heart disease Son     Cardiac arrythmia, with defibrillator  . Healthy Son   . Prostate cancer Son   . CAD Son     Social History   Social History  . Marital Status: Widowed    Spouse Name: N/A  . Number of Children: N/A  . Years of Education: N/A   Occupational History  . Not on file.   Social History Main Topics  . Smoking status: Former Smoker -- 1.00 packs/day for 30 years    Types: Cigarettes    Quit date: 10/14/1994  . Smokeless tobacco: Never Used     Comment: Patient quit smoking 25 years ago  . Alcohol Use: No     Comment: none for 8 months, wine occasionally  . Drug Use: No  . 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    Other Topics Concern  . Not on file   Social History Narrative    Outpatient Prescriptions Prior  to Visit  Medication Sig Dispense Refill  . acetaminophen (TYLENOL) 500 MG tablet Take 1,000 mg by mouth daily as needed for moderate pain.    Marland Kitchen amLODipine (NORVASC) 5 MG tablet Take 1 tablet (5 mg total) by mouth daily. 30 tablet 1  . aspirin EC 81 MG tablet Take 81 mg by mouth daily.    Marland Kitchen BREO ELLIPTA 100-25 MCG/INH AEPB Inhale 1 puff into the lungs daily.     . ferrous sulfate 325 (65 FE) MG tablet Take 1 tablet (325 mg total) by mouth 2 (two) times daily with a meal. (Patient taking differently: Take 325 mg by mouth daily with breakfast. ) 30 tablet 3  . furosemide (LASIX) 40 MG tablet Take 1 tablet (40 mg total) by mouth daily as needed for fluid or edema. 30 tablet 0  . hydroxypropyl methylcellulose / hypromellose (ISOPTO TEARS / GONIOVISC) 2.5 % ophthalmic solution Place 1 drop into both eyes daily as needed for dry eyes.    Marland Kitchen  ipratropium-albuterol (DUONEB) 0.5-2.5 (3) MG/3ML SOLN Take 3 mLs by nebulization every 6 (six) hours as needed (shortness of breath).     . isosorbide mononitrate (IMDUR) 30 MG 24 hr tablet Take 30 mg by mouth 2 (two) times daily.     . nitroGLYCERIN (NITROSTAT) 0.4 MG SL tablet Place 1 tablet (0.4 mg total) under the tongue every 5 (five) minutes as needed for chest pain. If no relief after 3 doses to ER 25 tablet 3  . omeprazole (PRILOSEC) 20 MG capsule Take 1 capsule by mouth daily.    Marland Kitchen PROAIR HFA 108 (90 BASE) MCG/ACT inhaler Inhale 2 puffs into the lungs every 6 (six) hours as needed for wheezing or shortness of breath.     . warfarin (COUMADIN) 4 MG tablet Take 1 tablet (4 mg total) by mouth daily. Take as directed. Take 4 mg on Monday and 7.5 mg Tuesday-Sunday. (Patient taking differently: Take 4 mg by mouth daily. Patient takes on Tuesday and Thursday)    . warfarin (COUMADIN) 7.5 MG tablet Take 7.5 mg by mouth daily. Patient take on Monday,Wednesday,Friday,Saturday,Sunday    . cyanocobalamin (,VITAMIN B-12,) 1000 MCG/ML injection Inject 1,000 mcg into the muscle once a week.      No facility-administered medications prior to visit.    No Known Allergies  Review of Systems  Constitutional: Positive for malaise/fatigue. Negative for fever.  HENT: Negative for congestion.   Eyes: Negative for discharge.  Respiratory: Negative for shortness of breath.   Cardiovascular: Negative for chest pain, palpitations and leg swelling.  Gastrointestinal: Negative for nausea and abdominal pain.  Genitourinary: Negative for dysuria.  Musculoskeletal: Negative for falls.  Skin: Negative for rash.  Neurological: Positive for weakness. Negative for loss of consciousness and headaches.  Endo/Heme/Allergies: Negative for environmental allergies.  Psychiatric/Behavioral: Negative for depression. The patient is not nervous/anxious.        Objective:    Physical Exam  Constitutional: She is  oriented to person, place, and time. She appears well-developed and well-nourished. No distress.  HENT:  Head: Normocephalic and atraumatic.  Nose: Nose normal.  Eyes: Right eye exhibits no discharge. Left eye exhibits no discharge.  Neck: Normal range of motion. Neck supple.  Cardiovascular: Normal rate.   No murmur heard. Irregularly irregular  Pulmonary/Chest: Effort normal and breath sounds normal.  Abdominal: Soft. Bowel sounds are normal. There is no tenderness.  Musculoskeletal: She exhibits no edema.  Neurological: She is alert and oriented to person, place, and time.  Skin: Skin is warm and dry.  Psychiatric: She has a normal mood and affect.  Nursing note and vitals reviewed.   BP 161/60 mmHg  Pulse 73  Temp(Src) 98.5 F (36.9 C) (Oral)  Wt 143 lb (64.864 kg)  SpO2 98% Wt Readings from Last 3 Encounters:  08/29/15 143 lb (64.864 kg)  06/04/15 143 lb (64.864 kg)  05/26/15 146 lb (66.225 kg)     Lab Results  Component Value Date   WBC 6.7 09/01/2015   HGB 8.0* 09/01/2015   HCT 27.4* 09/01/2015   PLT 489* 09/01/2015   GLUCOSE 98 08/29/2015   CHOL 145 08/29/2015   TRIG 64.0 08/29/2015   HDL 61.60 08/29/2015   LDLCALC 71 08/29/2015   ALT 9 08/29/2015   AST 13 08/29/2015   NA 140 08/29/2015   K 3.8 08/29/2015   CL 106 08/29/2015   CREATININE 0.71 08/29/2015   BUN 10 08/29/2015   CO2 24 08/29/2015   TSH 1.36 08/29/2015   INR 2.41* 06/04/2015   HGBA1C 6.2* 07/19/2011    Lab Results  Component Value Date   TSH 1.36 08/29/2015   Lab Results  Component Value Date   WBC 6.7 09/01/2015   HGB 8.0* 09/01/2015   HCT 27.4* 09/01/2015   MCV 58.7* 09/01/2015   PLT 489* 09/01/2015   Lab Results  Component Value Date   NA 140 08/29/2015   K 3.8 08/29/2015   CO2 24 08/29/2015   GLUCOSE 98 08/29/2015   BUN 10 08/29/2015   CREATININE 0.71 08/29/2015   BILITOT 0.9 08/29/2015   ALKPHOS 85 08/29/2015   AST 13 08/29/2015   ALT 9 08/29/2015   PROT 7.5  08/29/2015   ALBUMIN 3.9 08/29/2015   CALCIUM 9.9 08/29/2015   ANIONGAP 8 06/04/2015   GFR 102.23 08/29/2015   Lab Results  Component Value Date   CHOL 145 08/29/2015   Lab Results  Component Value Date   HDL 61.60 08/29/2015   Lab Results  Component Value Date   LDLCALC 71 08/29/2015   Lab Results  Component Value Date   TRIG 64.0 08/29/2015   Lab Results  Component Value Date   CHOLHDL 2 08/29/2015   Lab Results  Component Value Date   HGBA1C 6.2* 07/19/2011       Assessment & Plan:   Problem List Items Addressed This Visit    HTN (hypertension) (Chronic)    Elevated today but she has not taken meds today      Relevant Orders   CBC (Completed)   TSH (Completed)   Lipid panel (Completed)   Comprehensive metabolic panel (Completed)   Vitamin B12 (Completed)   GERD (gastroesophageal reflux disease)    Avoid offending foods, start probiotics. Do not eat large meals in late evening and consider raising head of bed.       COPD (chronic obstructive pulmonary disease) (H[facility])    Doing well, no recent exacerbation      B12 deficiency    Overly treated will drop frequency of treatments      Atrial fibrillation ([facility])    Rate controlled, on coumadin but may need to consider stopping coumadin due to recurrent anemia      Relevant Orders   CBC (Completed)   TSH (Completed)   Lipid panel (Completed)   Comprehensive metabolic panel (Completed)   Vitamin B12 (Completed)   Anemia    Is struggling with anemia and fatigue today, hgb returns at 8 she is asked to present for care  but declines. She agrees to proceed next week as outpatient and to seek care if worsens.      Relevant Medications   cyanocobalamin ((VITAMIN B-12)) injection 1,000 mcg   Other Relevant Orders   CBC (Completed)   TSH (Completed)   Lipid panel (Completed)   Comprehensive metabolic panel (Completed)   Vitamin B12 (Completed)    Other Visit Diagnoses    Need for prophylactic  vaccination and inoculation against influenza    -  Primary    Relevant Orders    Flu Vaccine QUAD 36+ mos PF IM (Fluarix & Fluzone Quad PF) (Completed)    CBC (Completed)    TSH (Completed)    Lipid panel (Completed)    Comprehensive metabolic panel (Completed)    Vitamin B12 (Completed)    Anticoagulated on Coumadin        Relevant Orders    CBC (Completed)    TSH (Completed)    Lipid panel (Completed)    Comprehensive metabolic panel (Completed)    Vitamin B12 (Completed)    Vitamin B12 deficiency        Relevant Medications    cyanocobalamin ((VITAMIN B-12)) injection 1,000 mcg    Other Relevant Orders    Vitamin B12 (Completed)       I have discontinued Ms. Schwegler's cyanocobalamin. I am also having her maintain her BREO ELLIPTA, PROAIR HFA, isosorbide mononitrate, acetaminophen, furosemide, ipratropium-albuterol, omeprazole, nitroGLYCERIN, aspirin EC, warfarin, hydroxypropyl methylcellulose / hypromellose, amLODipine, ferrous sulfate, and warfarin. We will continue to administer cyanocobalamin.  Meds ordered this encounter  Medications  . cyanocobalamin ((VITAMIN B-12)) injection 1,000 mcg    Sig:      Penni Homans, MD

## 2015-09-09 ENCOUNTER — Other Ambulatory Visit: Payer: Self-pay | Admitting: Family Medicine

## 2015-09-09 DIAGNOSIS — E785 Hyperlipidemia, unspecified: Secondary | ICD-10-CM

## 2015-09-09 DIAGNOSIS — D509 Iron deficiency anemia, unspecified: Secondary | ICD-10-CM

## 2015-09-09 NOTE — Telephone Encounter (Signed)
Patient has lab appointment on 09/26/15. Put order in for lipid profile, cmp and cbc as requested by [facility].  Once resulted will mail a copy to the patient and fax to [facility] at [phone]. Script/order for these labs on cma's desk.

## 2015-09-26 ENCOUNTER — Ambulatory Visit (INDEPENDENT_AMBULATORY_CARE_PROVIDER_SITE_OTHER): Payer: Medicare Other

## 2015-09-26 ENCOUNTER — Other Ambulatory Visit (INDEPENDENT_AMBULATORY_CARE_PROVIDER_SITE_OTHER): Payer: Medicare Other

## 2015-09-26 DIAGNOSIS — D509 Iron deficiency anemia, unspecified: Secondary | ICD-10-CM | POA: Diagnosis not present

## 2015-09-26 DIAGNOSIS — E785 Hyperlipidemia, unspecified: Secondary | ICD-10-CM

## 2015-09-26 DIAGNOSIS — E538 Deficiency of other specified B group vitamins: Secondary | ICD-10-CM | POA: Diagnosis not present

## 2015-09-26 LAB — CBC WITH DIFFERENTIAL/PLATELET
Basophils Absolute: 0 10*3/uL (ref 0.0–0.1)
Basophils Relative: 0 % (ref 0.0–3.0)
EOS PCT: 0.6 % (ref 0.0–5.0)
Eosinophils Absolute: 0 10*3/uL (ref 0.0–0.7)
HEMATOCRIT: 36.3 % (ref 36.0–46.0)
Hemoglobin: 11.1 g/dL — ABNORMAL LOW (ref 12.0–15.0)
Lymphocytes Relative: 12.5 % (ref 12.0–46.0)
Lymphs Abs: 1 10*3/uL (ref 0.7–4.0)
MCHC: 30.6 g/dL (ref 30.0–36.0)
MCV: 63.8 fl — ABNORMAL LOW (ref 78.0–100.0)
MONO ABS: 0.6 10*3/uL (ref 0.1–1.0)
Monocytes Relative: 7.7 % (ref 3.0–12.0)
NEUTROS PCT: 79.2 % — AB (ref 43.0–77.0)
Neutro Abs: 6.1 10*3/uL (ref 1.4–7.7)
PLATELETS: 492 10*3/uL — AB (ref 150.0–400.0)
RBC: 5.68 Mil/uL — AB (ref 3.87–5.11)
RDW: 30.9 % — ABNORMAL HIGH (ref 11.5–15.5)
WBC: 7.8 10*3/uL (ref 4.0–10.5)

## 2015-09-26 LAB — COMPREHENSIVE METABOLIC PANEL
ALBUMIN: 4.2 g/dL (ref 3.5–5.2)
ALK PHOS: 93 U/L (ref 39–117)
ALT: 10 U/L (ref 0–35)
AST: 17 U/L (ref 0–37)
BUN: 10 mg/dL (ref 6–23)
CO2: 25 mEq/L (ref 19–32)
Calcium: 10.4 mg/dL (ref 8.4–10.5)
Chloride: 107 mEq/L (ref 96–112)
Creatinine, Ser: 0.83 mg/dL (ref 0.40–1.20)
GFR: 85.36 mL/min (ref >60.00)
Glucose, Bld: 83 mg/dL (ref 70–99)
POTASSIUM: 4.2 meq/L (ref 3.5–5.1)
Sodium: 141 mEq/L (ref 135–145)
TOTAL PROTEIN: 7.8 g/dL (ref 6.0–8.3)
Total Bilirubin: 0.7 mg/dL (ref 0.2–1.2)

## 2015-09-26 LAB — LIPID PANEL
Cholesterol: 160 mg/dL (ref 0–200)
HDL: 67.5 mg/dL (ref >39.00)
LDL Cholesterol: 78 mg/dL (ref 0–99)
NONHDL: 92.89
TRIGLYCERIDES: 75 mg/dL (ref 0.0–149.0)
Total CHOL/HDL Ratio: 2
VLDL: 15 mg/dL (ref 0.0–40.0)

## 2015-09-26 MED ORDER — CYANOCOBALAMIN 1000 MCG/ML IJ SOLN
1000.0000 ug | Freq: Once | INTRAMUSCULAR | Status: AC
  Administered 2015-09-26: 1000 ug via INTRAMUSCULAR

## 2015-09-26 NOTE — Progress Notes (Signed)
Pre visit review using our clinic review tool, if applicable. No additional management support is needed unless otherwise documented below in the visit note.  Patient in for B12 injection 

## 2015-10-06 DIAGNOSIS — I079 Rheumatic tricuspid valve disease, unspecified: Secondary | ICD-10-CM | POA: Diagnosis not present

## 2015-10-06 DIAGNOSIS — I4891 Unspecified atrial fibrillation: Secondary | ICD-10-CM | POA: Diagnosis not present

## 2015-10-06 DIAGNOSIS — I10 Essential (primary) hypertension: Secondary | ICD-10-CM | POA: Diagnosis not present

## 2015-10-06 DIAGNOSIS — E785 Hyperlipidemia, unspecified: Secondary | ICD-10-CM | POA: Diagnosis not present

## 2015-10-06 DIAGNOSIS — I369 Nonrheumatic tricuspid valve disorder, unspecified: Secondary | ICD-10-CM | POA: Diagnosis not present

## 2015-10-06 DIAGNOSIS — I4892 Unspecified atrial flutter: Secondary | ICD-10-CM | POA: Diagnosis not present

## 2015-10-06 DIAGNOSIS — I2510 Atherosclerotic heart disease of native coronary artery without angina pectoris: Secondary | ICD-10-CM | POA: Diagnosis not present

## 2015-10-14 DIAGNOSIS — R58 Hemorrhage, not elsewhere classified: Secondary | ICD-10-CM | POA: Diagnosis not present

## 2015-10-14 DIAGNOSIS — Z87891 Personal history of nicotine dependence: Secondary | ICD-10-CM | POA: Diagnosis not present

## 2015-10-14 DIAGNOSIS — I25810 Atherosclerosis of coronary artery bypass graft(s) without angina pectoris: Secondary | ICD-10-CM | POA: Diagnosis not present

## 2015-10-14 DIAGNOSIS — I10 Essential (primary) hypertension: Secondary | ICD-10-CM | POA: Diagnosis not present

## 2015-10-14 DIAGNOSIS — Z859 Personal history of malignant neoplasm, unspecified: Secondary | ICD-10-CM | POA: Diagnosis not present

## 2015-10-14 DIAGNOSIS — R040 Epistaxis: Secondary | ICD-10-CM | POA: Diagnosis not present

## 2015-10-14 DIAGNOSIS — Z951 Presence of aortocoronary bypass graft: Secondary | ICD-10-CM | POA: Diagnosis not present

## 2015-10-15 DIAGNOSIS — I4891 Unspecified atrial fibrillation: Secondary | ICD-10-CM | POA: Diagnosis not present

## 2015-10-31 ENCOUNTER — Ambulatory Visit: Payer: Medicare Other | Admitting: Family Medicine

## 2015-10-31 ENCOUNTER — Ambulatory Visit (INDEPENDENT_AMBULATORY_CARE_PROVIDER_SITE_OTHER): Payer: Medicare Other

## 2015-10-31 DIAGNOSIS — E538 Deficiency of other specified B group vitamins: Secondary | ICD-10-CM | POA: Diagnosis not present

## 2015-10-31 MED ORDER — CYANOCOBALAMIN 1000 MCG/ML IJ SOLN
1000.0000 ug | Freq: Once | INTRAMUSCULAR | Status: AC
  Administered 2015-10-31: 1000 ug via INTRAMUSCULAR

## 2015-11-05 DIAGNOSIS — I480 Paroxysmal atrial fibrillation: Secondary | ICD-10-CM | POA: Diagnosis not present

## 2015-12-02 ENCOUNTER — Ambulatory Visit (INDEPENDENT_AMBULATORY_CARE_PROVIDER_SITE_OTHER): Payer: Medicare Other | Admitting: Behavioral Health

## 2015-12-02 DIAGNOSIS — E538 Deficiency of other specified B group vitamins: Secondary | ICD-10-CM

## 2015-12-02 MED ORDER — CYANOCOBALAMIN 1000 MCG/ML IJ SOLN
1000.0000 ug | Freq: Once | INTRAMUSCULAR | Status: AC
  Administered 2015-12-02: 1000 ug via INTRAMUSCULAR

## 2015-12-02 NOTE — Progress Notes (Signed)
Pre visit review using our clinic review tool, if applicable. No additional management support is needed unless otherwise documented below in the visit note.  Patient tolerated injection well.  Next injection scheduled for 01/09/16 at 9:30 AM.

## 2015-12-03 DIAGNOSIS — Z0000 Encounter for general adult medical examination without abnormal findings: Secondary | ICD-10-CM | POA: Diagnosis not present

## 2015-12-03 DIAGNOSIS — R270 Ataxia, unspecified: Secondary | ICD-10-CM | POA: Diagnosis not present

## 2015-12-03 DIAGNOSIS — I10 Essential (primary) hypertension: Secondary | ICD-10-CM | POA: Diagnosis not present

## 2015-12-03 DIAGNOSIS — I4891 Unspecified atrial fibrillation: Secondary | ICD-10-CM | POA: Diagnosis not present

## 2015-12-03 DIAGNOSIS — E785 Hyperlipidemia, unspecified: Secondary | ICD-10-CM | POA: Diagnosis not present

## 2015-12-08 DIAGNOSIS — I480 Paroxysmal atrial fibrillation: Secondary | ICD-10-CM | POA: Diagnosis not present

## 2015-12-22 ENCOUNTER — Ambulatory Visit: Payer: Medicare Other | Admitting: Family Medicine

## 2015-12-26 DIAGNOSIS — J9611 Chronic respiratory failure with hypoxia: Secondary | ICD-10-CM | POA: Diagnosis not present

## 2015-12-26 DIAGNOSIS — I10 Essential (primary) hypertension: Secondary | ICD-10-CM | POA: Diagnosis not present

## 2015-12-26 DIAGNOSIS — J449 Chronic obstructive pulmonary disease, unspecified: Secondary | ICD-10-CM | POA: Diagnosis not present

## 2016-01-08 DIAGNOSIS — I480 Paroxysmal atrial fibrillation: Secondary | ICD-10-CM | POA: Diagnosis not present

## 2016-01-09 ENCOUNTER — Ambulatory Visit: Payer: Medicare Other

## 2016-01-09 ENCOUNTER — Other Ambulatory Visit: Payer: Self-pay | Admitting: Family Medicine

## 2016-01-09 ENCOUNTER — Encounter: Payer: Self-pay | Admitting: Family Medicine

## 2016-01-09 ENCOUNTER — Ambulatory Visit (INDEPENDENT_AMBULATORY_CARE_PROVIDER_SITE_OTHER): Payer: Medicare Other | Admitting: Family Medicine

## 2016-01-09 VITALS — BP 138/80 | HR 73 | Temp 97.6°F | Ht 60.0 in | Wt 135.5 lb

## 2016-01-09 DIAGNOSIS — E785 Hyperlipidemia, unspecified: Secondary | ICD-10-CM | POA: Diagnosis not present

## 2016-01-09 DIAGNOSIS — K219 Gastro-esophageal reflux disease without esophagitis: Secondary | ICD-10-CM

## 2016-01-09 DIAGNOSIS — D500 Iron deficiency anemia secondary to blood loss (chronic): Secondary | ICD-10-CM | POA: Diagnosis not present

## 2016-01-09 DIAGNOSIS — I10 Essential (primary) hypertension: Secondary | ICD-10-CM

## 2016-01-09 DIAGNOSIS — W19XXXD Unspecified fall, subsequent encounter: Secondary | ICD-10-CM

## 2016-01-09 DIAGNOSIS — E538 Deficiency of other specified B group vitamins: Secondary | ICD-10-CM | POA: Diagnosis not present

## 2016-01-09 DIAGNOSIS — I482 Chronic atrial fibrillation, unspecified: Secondary | ICD-10-CM

## 2016-01-09 DIAGNOSIS — D509 Iron deficiency anemia, unspecified: Secondary | ICD-10-CM

## 2016-01-09 DIAGNOSIS — W19XXXA Unspecified fall, initial encounter: Secondary | ICD-10-CM

## 2016-01-09 LAB — CBC
HCT: 27.8 % — ABNORMAL LOW (ref 36.0–46.0)
MCHC: 30.4 g/dL (ref 30.0–36.0)
MCV: 59.3 fl — ABNORMAL LOW (ref 78.0–100.0)
PLATELETS: 529 10*3/uL — AB (ref 150.0–400.0)
RBC: 4.68 Mil/uL (ref 3.87–5.11)
RDW: 23.1 % — ABNORMAL HIGH (ref 11.5–15.5)
WBC: 7.4 10*3/uL (ref 4.0–10.5)

## 2016-01-09 LAB — COMPREHENSIVE METABOLIC PANEL
ALK PHOS: 84 U/L (ref 39–117)
ALT: 10 U/L (ref 0–35)
AST: 14 U/L (ref 0–37)
Albumin: 3.8 g/dL (ref 3.5–5.2)
BILIRUBIN TOTAL: 0.4 mg/dL (ref 0.2–1.2)
BUN: 7 mg/dL (ref 6–23)
CALCIUM: 9.6 mg/dL (ref 8.4–10.5)
CO2: 24 mEq/L (ref 19–32)
CREATININE: 0.74 mg/dL (ref 0.40–1.20)
Chloride: 107 mEq/L (ref 96–112)
GFR: 97.37 mL/min (ref >60.00)
GLUCOSE: 90 mg/dL (ref 70–99)
Potassium: 3.8 mEq/L (ref 3.5–5.1)
Sodium: 138 mEq/L (ref 135–145)
TOTAL PROTEIN: 7.1 g/dL (ref 6.0–8.3)

## 2016-01-09 LAB — TSH: TSH: 1.07 u[IU]/mL (ref 0.35–4.50)

## 2016-01-09 MED ORDER — CYANOCOBALAMIN 1000 MCG/ML IJ SOLN
1000.0000 ug | Freq: Once | INTRAMUSCULAR | Status: AC
  Administered 2016-01-09: 1000 ug via INTRAMUSCULAR

## 2016-01-09 NOTE — Assessment & Plan Note (Signed)
Well controlled, no changes to meds. Encouraged heart healthy diet such as the DASH diet and exercise as tolerated.  °

## 2016-01-09 NOTE — Patient Instructions (Signed)
Anemia, Nonspecific Anemia is a condition in which the concentration of red blood cells or hemoglobin in the blood is below normal. Hemoglobin is a substance in red blood cells that carries oxygen to the tissues of the body. Anemia results in not enough oxygen reaching these tissues.  CAUSES  Common causes of anemia include:   Excessive bleeding. Bleeding may be internal or external. This includes excessive bleeding from periods (in women) or from the intestine.   Poor nutrition.   Chronic kidney, thyroid, and liver disease.  Bone marrow disorders that decrease red blood cell production.  Cancer and treatments for cancer.  HIV, AIDS, and their treatments.  Spleen problems that increase red blood cell destruction.  Blood disorders.  Excess destruction of red blood cells due to infection, medicines, and autoimmune disorders. SIGNS AND SYMPTOMS   Minor weakness.   Dizziness.   Headache.  Palpitations.   Shortness of breath, especially with exercise.   Paleness.  Cold sensitivity.  Indigestion.  Nausea.  Difficulty sleeping.  Difficulty concentrating. Symptoms may occur suddenly or they may develop slowly.  DIAGNOSIS  Additional blood tests are often needed. These help your health care provider determine the best treatment. Your health care provider will check your stool for blood and look for other causes of blood loss.  TREATMENT  Treatment varies depending on the cause of the anemia. Treatment can include:   Supplements of iron, vitamin B12, or folic acid.   Hormone medicines.   A blood transfusion. This may be needed if blood loss is severe.   Hospitalization. This may be needed if there is significant continual blood loss.   Dietary changes.  Spleen removal. HOME CARE INSTRUCTIONS Keep all follow-up appointments. It often takes many weeks to correct anemia, and having your health care provider check on your condition and your response to  treatment is very important. SEEK IMMEDIATE MEDICAL CARE IF:   You develop extreme weakness, shortness of breath, or chest pain.   You become dizzy or have trouble concentrating.  You develop heavy vaginal bleeding.   You develop a rash.   You have bloody or black, tarry stools.   You faint.   You vomit up blood.   You vomit repeatedly.   You have abdominal pain.  You have a fever or persistent symptoms for more than 2-3 days.   You have a fever and your symptoms suddenly get worse.   You are dehydrated.  MAKE SURE YOU:  Understand these instructions.  Will watch your condition.  Will get help right away if you are not doing well or get worse.   This information is not intended to replace advice given to you by your health care provider. Make sure you discuss any questions you have with your health care provider.   Document Released: 12/09/2004 Document Revised: 07/04/2013 Document Reviewed: 04/27/2013 Elsevier Interactive Patient Education 2016 Elsevier Inc.  

## 2016-01-09 NOTE — Assessment & Plan Note (Signed)
Encouraged heart healthy diet, increase exercise, avoid trans fats, consider a krill oil cap daily. Last lipid panel WNL

## 2016-01-09 NOTE — Progress Notes (Signed)
Pre visit review using our clinic review tool, if applicable. No additional management support is needed unless otherwise documented below in the visit note. 

## 2016-01-09 NOTE — Assessment & Plan Note (Deleted)
Improving will recheck today

## 2016-01-13 DIAGNOSIS — M6281 Muscle weakness (generalized): Secondary | ICD-10-CM | POA: Diagnosis not present

## 2016-01-13 DIAGNOSIS — R260 Ataxic gait: Secondary | ICD-10-CM | POA: Diagnosis not present

## 2016-01-15 DIAGNOSIS — R260 Ataxic gait: Secondary | ICD-10-CM | POA: Diagnosis not present

## 2016-01-15 DIAGNOSIS — M6281 Muscle weakness (generalized): Secondary | ICD-10-CM | POA: Diagnosis not present

## 2016-01-18 ENCOUNTER — Encounter: Payer: Self-pay | Admitting: Family Medicine

## 2016-01-18 DIAGNOSIS — W19XXXA Unspecified fall, initial encounter: Secondary | ICD-10-CM

## 2016-01-18 DIAGNOSIS — R296 Repeated falls: Secondary | ICD-10-CM

## 2016-01-18 HISTORY — DX: Repeated falls: R29.6

## 2016-01-18 HISTORY — DX: Unspecified fall, initial encounter: W19.XXXA

## 2016-01-18 NOTE — Progress Notes (Signed)
Patient ID: Yesenia Woods, female   DOB: 17-Oct-1935, 81 y.o.   MRN: [id number]   Subjective:    Patient ID: Yesenia Woods, female    DOB: [birth year], 81 y.o.   MRN: [id number]  Chief Complaint  Patient presents with  . Follow-up    HPI Patient is in today for follow up. She is feeling welltoday. No recent illness or acute concerns except for some recent falls and disequilibrium. No chest pain or palpitations. Has some left knee pain s/p her fall but she is still ambulating and performing her own ADLs. Denies CP/palp/SOB/HA/congestion/fevers/GI or GU c/o. Taking meds as prescribed  Past Medical History  Diagnosis Date  . Essential hypertension   . Anemia   . Headache(784.0)   . Arthritis   . Duodenal ulcer   . Small bowel arteriovenous malformation   . Hyperlipidemia   . COPD (chronic obstructive pulmonary disease) ([place])   . Diverticulitis   . Atrial fibrillation ([place])   . Oxygen dependent   . CAD (coronary artery disease)     Status post LIMA to LAD at [facility] 04/1989  . Breast cancer ([place]) 12/09/2014    Mastectomy on left, chemo x 3 doses  . B12 deficiency 04/10/2015  . Chronic respiratory failure with hypoxia ([place])   . Falls 01/18/2016    Past Surgical History  Procedure Laterality Date  . Coronary artery bypass graft  1990    [facility] - LIMA to LAD  . Eye surgery    . Mastectomy Left   . Hand surgery Right   . Esophagogastroduodenoscopy  07/22/2011    Procedure: ESOPHAGOGASTRODUODENOSCOPY (EGD);  Surgeon: Rogene Houston, MD;  [facility];  Service: Endoscopy;  Laterality: N/A;  . Colonoscopy  07/22/2011    Procedure: COLONOSCOPY;  Surgeon: Rogene Houston, MD;  [facility];  Service: Endoscopy;  Laterality: N/A;  . Givens capsule study  07/30/2011    Procedure: GIVENS CAPSULE STUDY;  Surgeon: Rogene Houston, MD;  [facility];  Service: Endoscopy;  Laterality: N/A;  7:30  . Esophagogastroduodenoscopy N/A 04/23/2013    Procedure:  ESOPHAGOGASTRODUODENOSCOPY (EGD);  Surgeon: Rogene Houston, MD;  [facility];  Service: Endoscopy;  Laterality: N/A;    Family History  Problem Relation Age of Onset  . Prostate cancer Brother   . Prostate cancer Brother   . Breast cancer Daughter     Breast Cancer that mets  . Throat cancer Daughter   . Healthy Daughter   . Healthy Son   . Diabetes Son   . Hypertension Son   . Healthy Son   . Healthy Son   . Healthy Son   . Heart disease Son     Cardiac arrythmia, with defibrillator  . Healthy Son   . Prostate cancer Son   . CAD Son     Social History   Social History  . Marital Status: Widowed    Spouse Name: N/A  . Number of Children: N/A  . Years of Education: N/A   Occupational History  . Not on file.   Social History Main Topics  . Smoking status: Former Smoker -- 1.00 packs/day for 30 years    Types: Cigarettes    Quit date: 10/14/1994  . Smokeless tobacco: Never Used     Comment: Patient quit smoking 25 years ago  . Alcohol Use: No     Comment: none for 8 months, wine occasionally  . Drug Use: No  .  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    Other Topics Concern  . Not on file   Social History Narrative    Outpatient Prescriptions Prior to Visit  Medication Sig Dispense Refill  . acetaminophen (TYLENOL) 500 MG tablet Take 1,000 mg by mouth daily as needed for moderate pain.    Marland Kitchen amLODipine (NORVASC) 5 MG tablet Take 1 tablet (5 mg total) by mouth daily. 30 tablet 1  . aspirin EC 81 MG tablet Take 81 mg by mouth daily.    Marland Kitchen BREO ELLIPTA 100-25 MCG/INH AEPB Inhale 1 puff into the lungs daily.     . ferrous sulfate 325 (65 FE) MG tablet Take 1 tablet (325 mg total) by mouth 2 (two) times daily with a meal. (Patient taking differently: Take 325 mg by mouth daily with breakfast. ) 30 tablet 3  . furosemide (LASIX) 40 MG tablet Take 1 tablet (40 mg total) by mouth daily as needed  for fluid or edema. 30 tablet 0  . hydroxypropyl methylcellulose / hypromellose (ISOPTO TEARS / GONIOVISC) 2.5 % ophthalmic solution Place 1 drop into both eyes daily as needed for dry eyes.    Marland Kitchen ipratropium-albuterol (DUONEB) 0.5-2.5 (3) MG/3ML SOLN Take 3 mLs by nebulization every 6 (six) hours as needed (shortness of breath).     . isosorbide mononitrate (IMDUR) 30 MG 24 hr tablet Take 30 mg by mouth 2 (two) times daily.     . nitroGLYCERIN (NITROSTAT) 0.4 MG SL tablet Place 1 tablet (0.4 mg total) under the tongue every 5 (five) minutes as needed for chest pain. If no relief after 3 doses to ER 25 tablet 3  . omeprazole (PRILOSEC) 20 MG capsule Take 1 capsule by mouth daily.    Marland Kitchen PROAIR HFA 108 (90 BASE) MCG/ACT inhaler Inhale 2 puffs into the lungs every 6 (six) hours as needed for wheezing or shortness of breath.     . warfarin (COUMADIN) 4 MG tablet Take 1 tablet (4 mg total) by mouth daily. Take as directed. Take 4 mg on Monday and 7.5 mg Tuesday-Sunday. (Patient taking differently: Take 4 mg by mouth daily. Patient takes on Tuesday and Thursday)    . warfarin (COUMADIN) 7.5 MG tablet Take 7.5 mg by mouth daily. Patient take on Monday,Wednesday,Friday,Saturday,Sunday     Facility-Administered Medications Prior to Visit  Medication Dose Route Frequency Provider Last Rate Last Dose  . cyanocobalamin ((VITAMIN B-12)) injection 1,000 mcg  1,000 mcg Intramuscular Once Mosie Lukes, MD        No Known Allergies  Review of Systems  Constitutional: Negative for fever and malaise/fatigue.  HENT: Negative for congestion.   Eyes: Negative for discharge.  Respiratory: Negative for shortness of breath.   Cardiovascular: Negative for chest pain, palpitations and leg swelling.  Gastrointestinal: Negative for nausea and abdominal pain.  Genitourinary: Negative for dysuria.  Musculoskeletal: Negative for falls.  Skin: Negative for rash.  Neurological: Negative for loss of consciousness and  headaches.  Endo/Heme/Allergies: Negative for environmental allergies.  Psychiatric/Behavioral: Negative for depression. The patient is not nervous/anxious.        Objective:    Physical Exam  Constitutional: She is oriented to person, place, and time. She appears well-developed and well-nourished. No distress.  HENT:  Head: Normocephalic and atraumatic.  Nose: Nose normal.  Eyes: Right eye exhibits no discharge. Left eye exhibits no discharge.  Neck: Normal range of motion. Neck supple.  Cardiovascular: Normal  rate and regular rhythm.   Pulmonary/Chest: Effort normal and breath sounds normal.  Abdominal: Soft. Bowel sounds are normal. There is no tenderness.  Musculoskeletal: She exhibits no edema.  Neurological: She is alert and oriented to person, place, and time.  Skin: Skin is warm and dry.  Psychiatric: She has a normal mood and affect.  Nursing note and vitals reviewed.   BP 138/80 mmHg  Pulse 73  Temp(Src) 97.6 F (36.4 C) (Oral)  Ht 5' (1.524 m)  Wt 135 lb 8 oz (61.462 kg)  BMI 26.46 kg/m2  SpO2 93% Wt Readings from Last 3 Encounters:  01/09/16 135 lb 8 oz (61.462 kg)  08/29/15 143 lb (64.864 kg)  06/04/15 143 lb (64.864 kg)     Lab Results  Component Value Date   WBC 7.4 01/09/2016   HGB 8.4 Repeated and verified X2.* 01/09/2016   HCT 27.8* 01/09/2016   PLT 529.0* 01/09/2016   GLUCOSE 90 01/09/2016   CHOL 160 09/26/2015   TRIG 75.0 09/26/2015   HDL 67.50 09/26/2015   LDLCALC 78 09/26/2015   ALT 10 01/09/2016   AST 14 01/09/2016   NA 138 01/09/2016   K 3.8 01/09/2016   CL 107 01/09/2016   CREATININE 0.74 01/09/2016   BUN 7 01/09/2016   CO2 24 01/09/2016   TSH 1.07 01/09/2016   INR 2.41* 06/04/2015   HGBA1C 6.2* 07/19/2011    Lab Results  Component Value Date   TSH 1.07 01/09/2016   Lab Results  Component Value Date   WBC 7.4 01/09/2016   HGB 8.4 Repeated and verified X2.* 01/09/2016   HCT 27.8* 01/09/2016   MCV 59.3 Repeated and  verified X2.* 01/09/2016   PLT 529.0* 01/09/2016   Lab Results  Component Value Date   NA 138 01/09/2016   K 3.8 01/09/2016   CO2 24 01/09/2016   GLUCOSE 90 01/09/2016   BUN 7 01/09/2016   CREATININE 0.74 01/09/2016   BILITOT 0.4 01/09/2016   ALKPHOS 84 01/09/2016   AST 14 01/09/2016   ALT 10 01/09/2016   PROT 7.1 01/09/2016   ALBUMIN 3.8 01/09/2016   CALCIUM 9.6 01/09/2016   ANIONGAP 8 06/04/2015   GFR 97.37 01/09/2016   Lab Results  Component Value Date   CHOL 160 09/26/2015   Lab Results  Component Value Date   HDL 67.50 09/26/2015   Lab Results  Component Value Date   LDLCALC 78 09/26/2015   Lab Results  Component Value Date   TRIG 75.0 09/26/2015   Lab Results  Component Value Date   CHOLHDL 2 09/26/2015   Lab Results  Component Value Date   HGBA1C 6.2* 07/19/2011       Assessment & Plan:   Problem List Items Addressed This Visit    Anemia   Relevant Medications   cyanocobalamin ((VITAMIN B-12)) injection 1,000 mcg (Completed)   Atrial fibrillation (HCC)    Tolerating coumadin but if anemia continues to recur may need to consider discontinuing coumadin due to episodes of bleeding. Patient is not seeing any active bleeding.      Falls    Referred for physical therapy.       GERD (gastroesophageal reflux disease)    Avoid offending foods, start probiotics. Do not eat large meals in late evening and consider raising head of bed.       HTN (hypertension) - Primary (Chronic)    Well controlled, no changes to meds. Encouraged heart healthy diet such as the DASH diet and exercise  as tolerated.       Relevant Orders   TSH (Completed)   CBC (Completed)   Comprehensive metabolic panel (Completed)   Hyperlipidemia    Encouraged heart healthy diet, increase exercise, avoid trans fats, consider a krill oil cap daily. Last lipid panel WNL      IDA (iron deficiency anemia)    Recurrent but asymptomatic. Restart iron supplement and recheck cbc in 2  weeks.      Relevant Medications   cyanocobalamin ((VITAMIN B-12)) injection 1,000 mcg (Completed)    Other Visit Diagnoses    Fall, initial encounter        Relevant Orders    Ambulatory referral to Physical Therapy       I am having Ms. Erxleben maintain her BREO ELLIPTA, PROAIR HFA, isosorbide mononitrate, acetaminophen, furosemide, ipratropium-albuterol, omeprazole, nitroGLYCERIN, aspirin EC, warfarin, hydroxypropyl methylcellulose / hypromellose, amLODipine, ferrous sulfate, and warfarin. We administered cyanocobalamin. We will continue to administer cyanocobalamin.  Meds ordered this encounter  Medications  . cyanocobalamin ((VITAMIN B-12)) injection 1,000 mcg    Sig:      Penni Homans, MD

## 2016-01-18 NOTE — Assessment & Plan Note (Signed)
Referred for physical therapy

## 2016-01-18 NOTE — Assessment & Plan Note (Signed)
Avoid offending foods, start probiotics. Do not eat large meals in late evening and consider raising head of bed.  

## 2016-01-18 NOTE — Assessment & Plan Note (Signed)
Recurrent but asymptomatic. Restart iron supplement and recheck cbc in 2 weeks.

## 2016-01-18 NOTE — Assessment & Plan Note (Signed)
Tolerating coumadin but if anemia continues to recur may need to consider discontinuing coumadin due to episodes of bleeding. Patient is not seeing any active bleeding.

## 2016-01-20 ENCOUNTER — Telehealth: Payer: Self-pay | Admitting: *Deleted

## 2016-01-20 NOTE — Telephone Encounter (Signed)
Received Plan of Tx and OP Rehab paperwork; forwarded to provider/SLS 03/07

## 2016-01-21 DIAGNOSIS — M6281 Muscle weakness (generalized): Secondary | ICD-10-CM | POA: Diagnosis not present

## 2016-01-21 DIAGNOSIS — R260 Ataxic gait: Secondary | ICD-10-CM | POA: Diagnosis not present

## 2016-01-22 ENCOUNTER — Other Ambulatory Visit (INDEPENDENT_AMBULATORY_CARE_PROVIDER_SITE_OTHER): Payer: Medicare Other

## 2016-01-22 ENCOUNTER — Other Ambulatory Visit: Payer: Self-pay | Admitting: Family Medicine

## 2016-01-22 DIAGNOSIS — D509 Iron deficiency anemia, unspecified: Secondary | ICD-10-CM | POA: Diagnosis not present

## 2016-01-22 LAB — CBC WITH DIFFERENTIAL/PLATELET
BASOS ABS: 0 10*3/uL (ref 0.0–0.1)
Basophils Relative: 0.1 % (ref 0.0–3.0)
EOS PCT: 1 % (ref 0.0–5.0)
Eosinophils Absolute: 0.1 10*3/uL (ref 0.0–0.7)
HCT: 32.1 % — ABNORMAL LOW (ref 36.0–46.0)
HEMOGLOBIN: 9.8 g/dL — AB (ref 12.0–15.0)
Lymphocytes Relative: 13.4 % (ref 12.0–46.0)
Lymphs Abs: 0.9 10*3/uL (ref 0.7–4.0)
MCHC: 30.6 g/dL (ref 30.0–36.0)
MONO ABS: 1 10*3/uL (ref 0.1–1.0)
MONOS PCT: 13.6 % — AB (ref 3.0–12.0)
Neutro Abs: 5.1 10*3/uL (ref 1.4–7.7)
Neutrophils Relative %: 71.9 % (ref 43.0–77.0)
Platelets: 479 10*3/uL — ABNORMAL HIGH (ref 150.0–400.0)
RBC: 5.28 Mil/uL — AB (ref 3.87–5.11)
RDW: 28.4 % — ABNORMAL HIGH (ref 11.5–15.5)
WBC: 7.1 10*3/uL (ref 4.0–10.5)

## 2016-01-26 DIAGNOSIS — R260 Ataxic gait: Secondary | ICD-10-CM | POA: Diagnosis not present

## 2016-01-26 DIAGNOSIS — M6281 Muscle weakness (generalized): Secondary | ICD-10-CM | POA: Diagnosis not present

## 2016-01-27 DIAGNOSIS — R260 Ataxic gait: Secondary | ICD-10-CM | POA: Diagnosis not present

## 2016-01-27 DIAGNOSIS — M6281 Muscle weakness (generalized): Secondary | ICD-10-CM | POA: Diagnosis not present

## 2016-02-06 ENCOUNTER — Ambulatory Visit (INDEPENDENT_AMBULATORY_CARE_PROVIDER_SITE_OTHER): Payer: Medicare Other

## 2016-02-06 ENCOUNTER — Other Ambulatory Visit (INDEPENDENT_AMBULATORY_CARE_PROVIDER_SITE_OTHER): Payer: Medicare Other

## 2016-02-06 DIAGNOSIS — E538 Deficiency of other specified B group vitamins: Secondary | ICD-10-CM | POA: Diagnosis not present

## 2016-02-06 DIAGNOSIS — D509 Iron deficiency anemia, unspecified: Secondary | ICD-10-CM

## 2016-02-06 LAB — CBC WITH DIFFERENTIAL/PLATELET
BASOS ABS: 0 10*3/uL (ref 0.0–0.1)
BASOS PCT: 0.4 % (ref 0.0–3.0)
EOS ABS: 0 10*3/uL (ref 0.0–0.7)
Eosinophils Relative: 0.1 % (ref 0.0–5.0)
HCT: 34.6 % — ABNORMAL LOW (ref 36.0–46.0)
Hemoglobin: 10.6 g/dL — ABNORMAL LOW (ref 12.0–15.0)
LYMPHS ABS: 1.1 10*3/uL (ref 0.7–4.0)
Lymphocytes Relative: 16.3 % (ref 12.0–46.0)
MCHC: 30.5 g/dL (ref 30.0–36.0)
MONO ABS: 1.1 10*3/uL — AB (ref 0.1–1.0)
Monocytes Relative: 16.1 % — ABNORMAL HIGH (ref 3.0–12.0)
Neutro Abs: 4.5 10*3/uL (ref 1.4–7.7)
Neutrophils Relative %: 67.1 % (ref 43.0–77.0)
PLATELETS: 453 10*3/uL — AB (ref 150.0–400.0)
RBC: 5.49 Mil/uL — ABNORMAL HIGH (ref 3.87–5.11)
RDW: 29.8 % — AB (ref 11.5–15.5)
WBC: 6.7 10*3/uL (ref 4.0–10.5)

## 2016-02-06 MED ORDER — CYANOCOBALAMIN 1000 MCG/ML IJ SOLN
1000.0000 ug | Freq: Once | INTRAMUSCULAR | Status: AC
  Administered 2016-02-06: 1000 ug via INTRAMUSCULAR

## 2016-02-06 NOTE — Progress Notes (Signed)
Pre visit review using our clinic review tool, if applicable. No additional management support is needed unless otherwise documented below in the visit note.  Patient in for B12 injection due to B12 deficiency per S. Charlett Blake, M.D. Given 1000 mcg IM Right deltoid. Patient tolerated well. Return appointment given for 1 month.

## 2016-02-11 DIAGNOSIS — R260 Ataxic gait: Secondary | ICD-10-CM | POA: Diagnosis not present

## 2016-02-11 DIAGNOSIS — M6281 Muscle weakness (generalized): Secondary | ICD-10-CM | POA: Diagnosis not present

## 2016-02-12 DIAGNOSIS — I4891 Unspecified atrial fibrillation: Secondary | ICD-10-CM | POA: Diagnosis not present

## 2016-03-01 DIAGNOSIS — R260 Ataxic gait: Secondary | ICD-10-CM | POA: Diagnosis not present

## 2016-03-01 DIAGNOSIS — M6281 Muscle weakness (generalized): Secondary | ICD-10-CM | POA: Diagnosis not present

## 2016-03-03 DIAGNOSIS — R260 Ataxic gait: Secondary | ICD-10-CM | POA: Diagnosis not present

## 2016-03-03 DIAGNOSIS — M6281 Muscle weakness (generalized): Secondary | ICD-10-CM | POA: Diagnosis not present

## 2016-03-05 DIAGNOSIS — R260 Ataxic gait: Secondary | ICD-10-CM | POA: Diagnosis not present

## 2016-03-05 DIAGNOSIS — M6281 Muscle weakness (generalized): Secondary | ICD-10-CM | POA: Diagnosis not present

## 2016-03-08 DIAGNOSIS — M6281 Muscle weakness (generalized): Secondary | ICD-10-CM | POA: Diagnosis not present

## 2016-03-08 DIAGNOSIS — R260 Ataxic gait: Secondary | ICD-10-CM | POA: Diagnosis not present

## 2016-03-09 ENCOUNTER — Ambulatory Visit: Payer: Medicare Other

## 2016-03-10 ENCOUNTER — Emergency Department (HOSPITAL_COMMUNITY): Payer: Medicare Other

## 2016-03-10 ENCOUNTER — Emergency Department (HOSPITAL_COMMUNITY)
Admission: EM | Admit: 2016-03-10 | Discharge: 2016-03-10 | Disposition: A | Discharge status: Home / Self Care | Payer: Medicare Other | Attending: Emergency Medicine | Admitting: Emergency Medicine

## 2016-03-10 ENCOUNTER — Ambulatory Visit (INDEPENDENT_AMBULATORY_CARE_PROVIDER_SITE_OTHER): Payer: Medicare Other | Admitting: *Deleted

## 2016-03-10 ENCOUNTER — Encounter (HOSPITAL_COMMUNITY): Payer: Self-pay | Admitting: *Deleted

## 2016-03-10 ENCOUNTER — Ambulatory Visit (INDEPENDENT_AMBULATORY_CARE_PROVIDER_SITE_OTHER): Payer: Medicare Other | Admitting: Family Medicine

## 2016-03-10 VITALS — BP 162/70 | HR 60 | Temp 97.9°F | Resp 16 | Wt 130.8 lb

## 2016-03-10 DIAGNOSIS — I4891 Unspecified atrial fibrillation: Secondary | ICD-10-CM | POA: Diagnosis not present

## 2016-03-10 DIAGNOSIS — Z79899 Other long term (current) drug therapy: Secondary | ICD-10-CM | POA: Diagnosis not present

## 2016-03-10 DIAGNOSIS — E538 Deficiency of other specified B group vitamins: Secondary | ICD-10-CM | POA: Diagnosis not present

## 2016-03-10 DIAGNOSIS — I2510 Atherosclerotic heart disease of native coronary artery without angina pectoris: Secondary | ICD-10-CM | POA: Diagnosis not present

## 2016-03-10 DIAGNOSIS — R079 Chest pain, unspecified: Secondary | ICD-10-CM

## 2016-03-10 DIAGNOSIS — J449 Chronic obstructive pulmonary disease, unspecified: Secondary | ICD-10-CM | POA: Insufficient documentation

## 2016-03-10 DIAGNOSIS — Z853 Personal history of malignant neoplasm of breast: Secondary | ICD-10-CM | POA: Diagnosis not present

## 2016-03-10 DIAGNOSIS — I10 Essential (primary) hypertension: Secondary | ICD-10-CM | POA: Insufficient documentation

## 2016-03-10 DIAGNOSIS — E785 Hyperlipidemia, unspecified: Secondary | ICD-10-CM | POA: Insufficient documentation

## 2016-03-10 DIAGNOSIS — M1990 Unspecified osteoarthritis, unspecified site: Secondary | ICD-10-CM | POA: Insufficient documentation

## 2016-03-10 DIAGNOSIS — R072 Precordial pain: Secondary | ICD-10-CM | POA: Diagnosis not present

## 2016-03-10 DIAGNOSIS — R0602 Shortness of breath: Secondary | ICD-10-CM | POA: Diagnosis not present

## 2016-03-10 DIAGNOSIS — Z87891 Personal history of nicotine dependence: Secondary | ICD-10-CM | POA: Insufficient documentation

## 2016-03-10 DIAGNOSIS — R109 Unspecified abdominal pain: Secondary | ICD-10-CM | POA: Diagnosis not present

## 2016-03-10 LAB — CBC
HCT: 33.3 % — ABNORMAL LOW (ref 36.0–46.0)
Hemoglobin: 10.6 g/dL — ABNORMAL LOW (ref 12.0–15.0)
MCH: 19.7 pg — ABNORMAL LOW (ref 26.0–34.0)
MCHC: 31.8 g/dL (ref 30.0–36.0)
MCV: 61.9 fL — ABNORMAL LOW (ref 78.0–100.0)
Platelets: 458 10*3/uL — ABNORMAL HIGH (ref 150–400)
RBC: 5.38 MIL/uL — ABNORMAL HIGH (ref 3.87–5.11)
RDW: 23.9 % — ABNORMAL HIGH (ref 11.5–15.5)
WBC: 8.1 10*3/uL (ref 4.0–10.5)

## 2016-03-10 LAB — BASIC METABOLIC PANEL
Anion gap: 8 (ref 5–15)
BUN: 7 mg/dL (ref 6–20)
CO2: 24 mmol/L (ref 22–32)
Calcium: 9.7 mg/dL (ref 8.9–10.3)
Chloride: 105 mmol/L (ref 101–111)
Creatinine, Ser: 0.73 mg/dL (ref 0.44–1.00)
GFR calc Af Amer: >60 mL/min (ref >60)
GFR calc non Af Amer: >60 mL/min (ref >60)
Glucose, Bld: 115 mg/dL — ABNORMAL HIGH (ref 65–99)
Potassium: 3.3 mmol/L — ABNORMAL LOW (ref 3.5–5.1)
Sodium: 137 mmol/L (ref 135–145)

## 2016-03-10 LAB — TROPONIN I: Troponin I: <0.03 ng/mL (ref <0.031)

## 2016-03-10 MED ORDER — MORPHINE SULFATE (PF) 4 MG/ML IV SOLN
4.0000 mg | Freq: Once | INTRAVENOUS | Status: AC
  Administered 2016-03-10: 4 mg via INTRAVENOUS
  Filled 2016-03-10: qty 1

## 2016-03-10 MED ORDER — CYANOCOBALAMIN 1000 MCG/ML IJ SOLN
1000.0000 ug | Freq: Once | INTRAMUSCULAR | Status: AC
  Administered 2016-03-10: 1000 ug via INTRAMUSCULAR

## 2016-03-10 MED ORDER — NITROGLYCERIN 0.4 MG SL SUBL
0.4000 mg | SUBLINGUAL_TABLET | Freq: Once | SUBLINGUAL | Status: AC
  Administered 2016-03-10: 0.4 mg via SUBLINGUAL
  Filled 2016-03-10: qty 1

## 2016-03-10 NOTE — Progress Notes (Signed)
Pre visit review using our clinic review tool, if applicable. No additional management support is needed unless otherwise documented below in the visit note. 

## 2016-03-10 NOTE — Patient Instructions (Signed)
You may be having a heart attack or other life threatening heart problem.  I recommend that you seek immediate emergency care.

## 2016-03-10 NOTE — Progress Notes (Signed)
RN note reviewed. Agree with documention and plan. 

## 2016-03-10 NOTE — ED Notes (Signed)
Patient c/o chest pain since Monday. Was at [facility] today and sent here with paperwork, stating she may be having a heart attack and to come to ER. Patient choose to come here. Denies radiation.  Reports some shortness of breath.

## 2016-03-10 NOTE — ED Notes (Signed)
EDP at bedside  

## 2016-03-10 NOTE — Progress Notes (Signed)
Pre visit review using our clinic review tool, if applicable. No additional management support is needed unless otherwise documented below in the visit note.  Pt tolerated injection well. Pt mentioned during appt having mid chest pain/pressure x2 days. Rates this pain as 10/10. Offered pt appt in office and she declined stating that she didn't want to wait and would rather just see her cardiologist in [place], [place]. Advised pt that considering her chest pain, we would not recommend her getting back in the car and driving to [place]. Eventually was able to convince pt to take 11am appt today w/ Dr. Lorelei Pont. Per Dr. Lorelei Pont, obtained EKG and VS. Stable at this time, printed EKG given to Dr. Lorelei Pont and pt escorted to exam room for appt.   Next B12 appt: 04/07/16  Dorrene German, RN

## 2016-03-10 NOTE — ED Provider Notes (Signed)
CSN: [id number]     Arrival date & time 03/10/16  1210 History  By signing my name below, I, [name], attest that this documentation has been prepared under the direction and in the presence of Virgel Manifold, MD. Electronically Signed: [name], ED Scribe. 03/10/2016. 12:31 PM.  Chief Complaint  Patient presents with  . Chest Pain   The history is provided by the patient. No language interpreter was used.   PCP: Penni Homans, MD  CARDIOLOGIST: DR. Rosalita Chessman  HPI Comments: Yesenia Woods is a 81 y.o. female, who is O2 dependent at baseline (2L/min) with PMHx noted below including CABG in 1999, chronic coumadin use, Afib, HTN, HLD, AFib, CAD, who presents to the Emergency Department complaining of recurrent, constant, 10/10, squeezing, mid sternal chest pain radiating to right arm with current episode onset two days ago. Movement exacerbates the pain. Pt denies taking any meds for her pain. Associated Sx include SOB and RUQ pain. Pt denies nausea, diaphoresis, cough, fever, chills.   Past Medical History  Diagnosis Date  . Essential hypertension   . Anemia   . Headache(784.0)   . Arthritis   . Duodenal ulcer   . Small bowel arteriovenous malformation   . Hyperlipidemia   . COPD (chronic obstructive pulmonary disease) ([place])   . Diverticulitis   . Atrial fibrillation ([place])   . Oxygen dependent   . CAD (coronary artery disease)     Status post LIMA to LAD at [facility] 04/1989  . Breast cancer ([place]) 12/09/2014    Mastectomy on left, chemo x 3 doses  . B12 deficiency 04/10/2015  . Chronic respiratory failure with hypoxia ([place])   . Falls 01/18/2016   Past Surgical History  Procedure Laterality Date  . Coronary artery bypass graft  1990    [facility] - LIMA to LAD  . Eye surgery    . Mastectomy Left   . Hand surgery Right   . Esophagogastroduodenoscopy  07/22/2011    Procedure: ESOPHAGOGASTRODUODENOSCOPY (EGD);  Surgeon: Rogene Houston, MD;  [facility];  Service: Endoscopy;   Laterality: N/A;  . Colonoscopy  07/22/2011    Procedure: COLONOSCOPY;  Surgeon: Rogene Houston, MD;  [facility];  Service: Endoscopy;  Laterality: N/A;  . Givens capsule study  07/30/2011    Procedure: GIVENS CAPSULE STUDY;  Surgeon: Rogene Houston, MD;  [facility];  Service: Endoscopy;  Laterality: N/A;  7:30  . Esophagogastroduodenoscopy N/A 04/23/2013    Procedure: ESOPHAGOGASTRODUODENOSCOPY (EGD);  Surgeon: Rogene Houston, MD;  [facility];  Service: Endoscopy;  Laterality: N/A;   Family History  Problem Relation Age of Onset  . Prostate cancer Brother   . Prostate cancer Brother   . Breast cancer Daughter     Breast Cancer that mets  . Throat cancer Daughter   . Healthy Daughter   . Healthy Son   . Diabetes Son   . Hypertension Son   . Healthy Son   . Healthy Son   . Healthy Son   . Heart disease Son     Cardiac arrythmia, with defibrillator  . Healthy Son   . Prostate cancer Son   . CAD Son    Social History  Substance Use Topics  . Smoking status: Former Smoker -- 1.00 packs/day for 30 years    Types: Cigarettes    Quit date: 10/14/1994  . Smokeless tobacco: Never Used     Comment: Patient quit smoking 25 years  ago  . Alcohol Use: No     Comment: none for 8 months, wine occasionally   OB History    No data available     Review of Systems  Constitutional: Negative for fever, chills and diaphoresis.  Respiratory: Positive for shortness of breath. Negative for cough.   Cardiovascular: Positive for chest pain.  Gastrointestinal: Positive for abdominal pain. Negative for nausea.  Hematological: Bruises/bleeds easily.  All other systems reviewed and are negative.     Allergies  Review of patient's allergies indicates no known allergies.  Home Medications   Prior to Admission medications   Medication Sig Start Date End Date Taking? Authorizing Provider  acetaminophen (TYLENOL) 500 MG tablet Take 1,000 mg by mouth daily  as needed for moderate pain.    Historical Provider, MD  amLODipine (NORVASC) 5 MG tablet Take 1 tablet (5 mg total) by mouth daily. 04/09/15   Radene Gunning, NP  aspirin EC 81 MG tablet Take 81 mg by mouth daily.    Historical Provider, MD  BREO ELLIPTA 100-25 MCG/INH AEPB Inhale 1 puff into the lungs daily.  06/21/14   Historical Provider, MD  ferrous sulfate 325 (65 FE) MG tablet Take 1 tablet (325 mg total) by mouth 2 (two) times daily with a meal. Patient taking differently: Take 325 mg by mouth daily with breakfast.  04/09/15   Radene Gunning, NP  furosemide (LASIX) 40 MG tablet Take 1 tablet (40 mg total) by mouth daily as needed for fluid or edema. 10/16/14   Radene Gunning, NP  hydroxypropyl methylcellulose / hypromellose (ISOPTO TEARS / GONIOVISC) 2.5 % ophthalmic solution Place 1 drop into both eyes daily as needed for dry eyes.    Historical Provider, MD  ipratropium-albuterol (DUONEB) 0.5-2.5 (3) MG/3ML SOLN Take 3 mLs by nebulization every 6 (six) hours as needed (shortness of breath).     Historical Provider, MD  isosorbide mononitrate (IMDUR) 30 MG 24 hr tablet Take 30 mg by mouth 2 (two) times daily.  10/12/14   Historical Provider, MD  nitroGLYCERIN (NITROSTAT) 0.4 MG SL tablet Place 1 tablet (0.4 mg total) under the tongue every 5 (five) minutes as needed for chest pain. If no relief after 3 doses to ER 03/13/15   Mosie Lukes, MD  omeprazole (PRILOSEC) 20 MG capsule Take 1 capsule by mouth daily. 12/12/14   Historical Provider, MD  PROAIR HFA 108 (90 BASE) MCG/ACT inhaler Inhale 2 puffs into the lungs every 6 (six) hours as needed for wheezing or shortness of breath.  09/03/14   Historical Provider, MD  warfarin (COUMADIN) 4 MG tablet Take 1 tablet (4 mg total) by mouth daily. Take as directed. Take 4 mg on Monday and 7.5 mg Tuesday-Sunday. Patient taking differently: Take 4 mg by mouth daily. Patient takes on Tuesday and Thursday 04/09/15   Radene Gunning, NP  warfarin (COUMADIN) 7.5 MG  tablet Take 7.5 mg by mouth daily. Patient take on [place] Provider, MD   Triage Vitals: BP 181/72 mmHg  Pulse 65  Temp(Src) 98.6 F (37 C) (Oral)  Resp 18  Ht 5' (1.524 m)  Wt 130 lb (58.968 kg)  BMI 25.39 kg/m2  SpO2 99% Physical Exam  Constitutional: She is oriented to person, place, and time. She appears well-developed and well-nourished. No distress.  HENT:  Head: Normocephalic and atraumatic.  Eyes: EOM are normal.  Neck: Normal range of motion.  Cardiovascular: Normal heart sounds.  An irregularly irregular rhythm  present.  Pulmonary/Chest: Effort normal and breath sounds normal.  Abdominal: Soft. She exhibits no distension. There is no tenderness.  Musculoskeletal: Normal range of motion.  Neurological: She is alert and oriented to person, place, and time.  Skin: Skin is warm and dry.  Psychiatric: She has a normal mood and affect. Judgment normal.  Nursing note and vitals reviewed.   ED Course  Procedures (including critical care time) DIAGNOSTIC STUDIES: Oxygen Saturation is 99% on ra, nl by my interpretation.    COORDINATION OF CARE: 12:28 PM: Discussed treatment plan which includes imaging and labs with pt at bedside; patient verbalizes understanding and agrees with treatment plan.  Labs Review Labs Reviewed  BASIC METABOLIC PANEL - Abnormal; Notable for the following:    Potassium 3.3 (*)    Glucose, Bld 115 (*)    All other components within normal limits  CBC - Abnormal; Notable for the following:    RBC 5.38 (*)    Hemoglobin 10.6 (*)    HCT 33.3 (*)    MCV 61.9 (*)    MCH 19.7 (*)    RDW 23.9 (*)    Platelets 458 (*)    All other components within normal limits  TROPONIN I    Imaging Review No results found.  Dg Chest 2 View  03/10/2016  CLINICAL DATA:  Acute chest pain. EXAM: CHEST  2 VIEW COMPARISON:  Apr 07, 2015. FINDINGS: Stable cardiomegaly. Sternotomy wires are noted. No pneumothorax or  pleural effusion is noted. Atherosclerosis of thoracic aorta is noted. No acute pulmonary disease is noted. Bony thorax is unremarkable. IMPRESSION: No active cardiopulmonary disease. Electronically Signed   By: Marijo Conception, M.D.   On: 03/10/2016 13:44   I have personally reviewed and evaluated these images and lab results as part of my medical decision-making.   EKG Interpretation   Date/Time:  Wednesday March 10 2016 12:15:15 EDT Ventricular Rate:  79 PR Interval:    QRS Duration: 118 QT Interval:  362 QTC Calculation: 415 R Axis:   20 Text Interpretation:  Atrial fibrillation Non-specific ST-t changes No  significant change since last tracing Confirmed by [place]  MD, Anthony  ([id number]) on 03/10/2016 12:20:19 PM      MDM   Final diagnoses:  Chest pain, unspecified chest pain type   81yM with CP. Doubt ACS, PE, dissection other emergent process.   I personally preformed the services scribed in my presence. The recorded information has been reviewed is accurate. Virgel Manifold, MD.    Virgel Manifold, MD 03/17/16 [phone]

## 2016-03-10 NOTE — Discharge Instructions (Signed)
Nonspecific Chest Pain  °Chest pain can be caused by many different conditions. There is always a chance that your pain could be related to something serious, such as a heart attack or a blood clot in your lungs. Chest pain can also be caused by conditions that are not life-threatening. If you have chest pain, it is very important to follow up with your health care provider. °CAUSES  °Chest pain can be caused by: °· Heartburn. °· Pneumonia or bronchitis. °· Anxiety or stress. °· Inflammation around your heart (pericarditis) or lung (pleuritis or pleurisy). °· A blood clot in your lung. °· A collapsed lung (pneumothorax). It can develop suddenly on its own (spontaneous pneumothorax) or from trauma to the chest. °· Shingles infection (varicella-zoster virus). °· Heart attack. °· Damage to the bones, muscles, and cartilage that make up your chest wall. This can include: °¨ Bruised bones due to injury. °¨ Strained muscles or cartilage due to frequent or repeated coughing or overwork. °¨ Fracture to one or more ribs. °¨ Sore cartilage due to inflammation (costochondritis). °RISK FACTORS  °Risk factors for chest pain may include: °· Activities that increase your risk for trauma or injury to your chest. °· Respiratory infections or conditions that cause frequent coughing. °· Medical conditions or overeating that can cause heartburn. °· Heart disease or family history of heart disease. °· Conditions or health behaviors that increase your risk of developing a blood clot. °· Having had chicken pox (varicella zoster). °SIGNS AND SYMPTOMS °Chest pain can feel like: °· Burning or tingling on the surface of your chest or deep in your chest. °· Crushing, pressure, aching, or squeezing pain. °· Dull or sharp pain that is worse when you move, cough, or take a deep breath. °· Pain that is also felt in your back, neck, shoulder, or arm, or pain that spreads to any of these areas. °Your chest pain may come and go, or it may stay  constant. °DIAGNOSIS °Lab tests or other studies may be needed to find the cause of your pain. Your health care provider may have you take a test called an ambulatory ECG (electrocardiogram). An ECG records your heartbeat patterns at the time the test is performed. You may also have other tests, such as: °· Transthoracic echocardiogram (TTE). During echocardiography, sound waves are used to create a picture of all of the heart structures and to look at how blood flows through your heart. °· Transesophageal echocardiogram (TEE). This is a more advanced imaging test that obtains images from inside your body. It allows your health care provider to see your heart in finer detail. °· Cardiac monitoring. This allows your health care provider to monitor your heart rate and rhythm in real time. °· Holter monitor. This is a portable device that records your heartbeat and can help to diagnose abnormal heartbeats. It allows your health care provider to track your heart activity for several days, if needed. °· Stress tests. These can be done through exercise or by taking medicine that makes your heart beat more quickly. °· Blood tests. °· Imaging tests. °TREATMENT  °Your treatment depends on what is causing your chest pain. Treatment may include: °· Medicines. These may include: °¨ Acid blockers for heartburn. °¨ Anti-inflammatory medicine. °¨ Pain medicine for inflammatory conditions. °¨ Antibiotic medicine, if an infection is present. °¨ Medicines to dissolve blood clots. °¨ Medicines to treat coronary artery disease. °· Supportive care for conditions that do not require medicines. This may include: °¨ Resting. °¨ Applying heat   or cold packs to injured areas. °¨ Limiting activities until pain decreases. °HOME CARE INSTRUCTIONS °· If you were prescribed an antibiotic medicine, finish it all even if you start to feel better. °· Avoid any activities that bring on chest pain. °· Do not use any tobacco products, including  cigarettes, chewing tobacco, or electronic cigarettes. If you need help quitting, ask your health care provider. °· Do not drink alcohol. °· Take medicines only as directed by your health care provider. °· Keep all follow-up visits as directed by your health care provider. This is important. This includes any further testing if your chest pain does not go away. °· If heartburn is the cause for your chest pain, you may be told to keep your head raised (elevated) while sleeping. This reduces the chance that acid will go from your stomach into your esophagus. °· Make lifestyle changes as directed by your health care provider. These may include: °¨ Getting regular exercise. Ask your health care provider to suggest some activities that are safe for you. °¨ Eating a heart-healthy diet. A registered dietitian can help you to learn healthy eating options. °¨ Maintaining a healthy weight. °¨ Managing diabetes, if necessary. °¨ Reducing stress. °SEEK MEDICAL CARE IF: °· Your chest pain does not go away after treatment. °· You have a rash with blisters on your chest. °· You have a fever. °SEEK IMMEDIATE MEDICAL CARE IF:  °· Your chest pain is worse. °· You have an increasing cough, or you cough up blood. °· You have severe abdominal pain. °· You have severe weakness. °· You faint. °· You have chills. °· You have sudden, unexplained chest discomfort. °· You have sudden, unexplained discomfort in your arms, back, neck, or jaw. °· You have shortness of breath at any time. °· You suddenly start to sweat, or your skin gets clammy. °· You feel nauseous or you vomit. °· You suddenly feel light-headed or dizzy. °· Your heart begins to beat quickly, or it feels like it is skipping beats. °These symptoms may represent a serious problem that is an emergency. Do not wait to see if the symptoms will go away. Get medical help right away. Call your local emergency services (911 in the [place] Do not drive yourself to the hospital. °  °This  information is not intended to replace advice given to you by your health care provider. Make sure you discuss any questions you have with your health care provider. °  °Document Released: 08/11/2005 Document Revised: 11/22/2014 Document Reviewed: 06/07/2014 °Elsevier Interactive Patient Education ©2016 Elsevier Inc. ° °

## 2016-03-10 NOTE — Progress Notes (Signed)
[place] at [facility] [address], [place], [place] [address] [phone]  Date:  03/10/2016   Name:  Yesenia Woods   DOB:  25-May-1935   MRN:  [id number]  PCP:  Penni Homans, MD    Chief Complaint: Chest Pain   History of Present Illness:  Yesenia Woods is a 81 y.o. very pleasant female patient who presents with the following:  Pt with history of a fib, CAD with history of CABG in 1999, on chronic coumadin.  Came in today to for a B12 injection and also mentioned chest pain to the nurse. She is also on chronic oxygen.  She has noted persistent CP since Monday, today is Wednesday.  States that she took her nitro once but it did not help.  States that she often has periods of CP but it has not gone away this time.   States that her pain is 10/10.  At first pt refused to be seen today, saying that she was not able to wait 45 minutes for an appt.  Then did agree to be seen.    Her cardiologist is in [place]- Dr. Lona Kettle with [facility] that she is unwilling to be seen at the ER here, she will only to the hospital in [place] "for my own personal reasons."   Contrasted echo from a year ago showed EF of 55%, basal inferior hypokinesis. Severe pulm hypertension  - Mild LVH with LVEF approximately 55%, basal inferior hypokinesis  noted. Indeterminate diastolic function with evidence of  increased filling pressures. Moderate to severe left atrial  enlargement. Moderate, posteriorly directed mitral regurgitation  as outlined above. Mildly reduced RV contraction. Severe  pulmonary hypertension with PASP 75 mmHg.  Lab Results  Component Value Date   INR 2.41* 06/04/2015   INR 2.11* 04/10/2015   INR 2.59* 04/09/2015     Patient Active Problem List   Diagnosis Date Noted  . Falls 01/18/2016  . Bradycardia 04/10/2015  . B12 deficiency 04/10/2015  . IDA (iron deficiency anemia)  04/10/2015  . Chronic respiratory failure with hypoxia ([place]) 04/10/2015  . Pain in the chest   . Breast cancer ([place]) 12/09/2014  . COPD (chronic obstructive pulmonary disease) ([place]) 11/22/2014  . Pulmonary hypertension ([place]) 11/22/2014  . Small bowel arteriovenous malformation 10/14/2014  . Leukocytosis, unspecified 04/23/2013  . Chest pain 04/20/2013  . Atrial fibrillation ([place]) 04/20/2013  . GERD (gastroesophageal reflux disease) 04/20/2013  . Hyperlipidemia 04/20/2013  . Hypoxia 04/20/2013  . Iron deficiency anemia 06/19/2012  . Occult GI bleeding 06/19/2012  . Anemia 07/19/2011  . CAD (coronary artery disease) 07/19/2011  . HTN (hypertension) 07/19/2011  . Anticoagulated on warfarin 07/19/2011    Past Medical History  Diagnosis Date  . Essential hypertension   . Anemia   . Headache(784.0)   . Arthritis   . Duodenal ulcer   . Small bowel arteriovenous malformation   . Hyperlipidemia   . COPD (chronic obstructive pulmonary disease) ([place])   . Diverticulitis   . Atrial fibrillation ([place])   . Oxygen dependent   . CAD (coronary artery disease)     Status post LIMA to LAD at [facility] 04/1989  . Breast cancer ([place]) 12/09/2014    Mastectomy on left, chemo x 3 doses  . B12 deficiency 04/10/2015  . Chronic respiratory failure with hypoxia ([place])   . Falls 01/18/2016    Past Surgical History  Procedure Laterality Date  .  Coronary artery bypass graft  1990    [facility] - LIMA to LAD  . Eye surgery    . Mastectomy Left   . Hand surgery Right   . Esophagogastroduodenoscopy  07/22/2011    Procedure: ESOPHAGOGASTRODUODENOSCOPY (EGD);  Surgeon: Rogene Houston, MD;  Location[facility];  Service: Endoscopy;  Laterality: N/A;  . Colonoscopy  07/22/2011    Procedure: COLONOSCOPY;  Surgeon: Rogene Houston, MD;  Location: [facility];  Service: Endoscopy;  Laterality: N/A;  . Givens capsule study  07/30/2011    Procedure: GIVENS CAPSULE STUDY;  Surgeon: Rogene Houston, MD;  Location: [facility];  Service: Endoscopy;  Laterality: N/A;  7:30  . Esophagogastroduodenoscopy N/A 04/23/2013    Procedure: ESOPHAGOGASTRODUODENOSCOPY (EGD);  Surgeon: Rogene Houston, MD;  Location: [facility];  Service: Endoscopy;  Laterality: N/A;    Social History  Substance Use Topics  . Smoking status: Former Smoker -- 1.00 packs/day for 30 years    Types: Cigarettes    Quit date: 10/14/1994  . Smokeless tobacco: Never Used     Comment: Patient quit smoking 25 years ago  . Alcohol Use: No     Comment: none for 8 months, wine occasionally    Family History  Problem Relation Age of Onset  . Prostate cancer Brother   . Prostate cancer Brother   . Breast cancer Daughter     Breast Cancer that mets  . Throat cancer Daughter   . Healthy Daughter   . Healthy Son   . Diabetes Son   . Hypertension Son   . Healthy Son   . Healthy Son   . Healthy Son   . Heart disease Son     Cardiac arrythmia, with defibrillator  . Healthy Son   . Prostate cancer Son   . CAD Son     No Known Allergies  Medication list has been reviewed and updated.  Current Outpatient Prescriptions on File Prior to Visit  Medication Sig Dispense Refill  . acetaminophen (TYLENOL) 500 MG tablet Take 1,000 mg by mouth daily as needed for moderate pain.    Marland Kitchen amLODipine (NORVASC) 5 MG tablet Take 1 tablet (5 mg total) by mouth daily. 30 tablet 1  . aspirin EC 81 MG tablet Take 81 mg by mouth daily.    Marland Kitchen BREO ELLIPTA 100-25 MCG/INH AEPB Inhale 1 puff into the lungs daily.     . ferrous sulfate 325 (65 FE) MG tablet Take 1 tablet (325 mg total) by mouth 2 (two) times daily with a meal. (Patient taking differently: Take 325 mg by mouth daily with breakfast. ) 30 tablet 3  . furosemide (LASIX) 40 MG tablet Take 1 tablet (40 mg total) by mouth daily as needed for fluid or edema. 30 tablet 0  . hydroxypropyl methylcellulose / hypromellose (ISOPTO TEARS / GONIOVISC) 2.5 % ophthalmic solution Place 1 drop into both eyes daily as  needed for dry eyes.    Marland Kitchen ipratropium-albuterol (DUONEB) 0.5-2.5 (3) MG/3ML SOLN Take 3 mLs by nebulization every 6 (six) hours as needed (shortness of breath).     . isosorbide mononitrate (IMDUR) 30 MG 24 hr tablet Take 30 mg by mouth 2 (two) times daily.     . nitroGLYCERIN (NITROSTAT) 0.4 MG SL tablet Place 1 tablet (0.4 mg total) under the tongue every 5 (five) minutes as needed for chest pain. If no relief after 3 doses to ER 25 tablet 3  . omeprazole (PRILOSEC) 20  MG capsule Take 1 capsule by mouth daily.    Marland Kitchen PROAIR HFA 108 (90 BASE) MCG/ACT inhaler Inhale 2 puffs into the lungs every 6 (six) hours as needed for wheezing or shortness of breath.     . warfarin (COUMADIN) 4 MG tablet Take 1 tablet (4 mg total) by mouth daily. Take as directed. Take 4 mg on Monday and 7.5 mg Tuesday-Sunday. (Patient taking differently: Take 4 mg by mouth daily. Patient takes on Tuesday and Thursday)    . warfarin (COUMADIN) 7.5 MG tablet Take 7.5 mg by mouth daily. Patient take on Monday,Wednesday,Friday,Saturday,Sunday     Current Facility-Administered Medications on File Prior to Visit  Medication Dose Route Frequency Provider Last Rate Last Dose  . cyanocobalamin ((VITAMIN B-12)) injection 1,000 mcg  1,000 mcg Intramuscular Once Mosie Lukes, MD        Review of Systems:  As per HPI- otherwise negative.   Physical Examination: Filed Vitals:   03/10/16 1012 03/10/16 1029  BP: 181/76 162/70  Pulse: 60   Temp: 97.9 F (36.6 C)   Resp: 16    Filed Vitals:   03/10/16 1012  Weight: 130 lb 12.8 oz (59.33 kg)   Body mass index is 25.54 kg/(m^2). Ideal Body Weight:    GEN: WDWN, NAD, Non-toxic, A & O x 3, elderly appearing lady wearing O2 via nasal canula.  HEENT: Atraumatic, Normocephalic. Neck supple. No masses, No LAD. Ears and Nose: No external deformity. CV: RRR, No M/G/R. No JVD. No thrill. No extra heart sounds. PULM: CTA B, no wheezes, crackles, rhonchi. No retractions. No resp.  distress. No accessory muscle use. ABD: S, NT, ND. No rebound. No HSM. EXTR: No c/c/e NEURO Normal gait.  PSYCH: Normally interactive. Conversant. Not depressed or anxious appearing.  Calm demeanor.  No rash or lesion on the chest wall.  She does complaint of tenderness iwht pressing on her chest wall  EKG: compared with tracing from about one year ago.  A fib with ventricular rate of approx 60. ST depression in the lateral chest leads which does appear to be stable.   Assessment and Plan: Chest pain, unspecified chest pain type - Plan: EKG 12-Lead  History of CAD, here today with chest pain for 2 days which she describes as 10/10. Called her cardiologist but did not hear back from him. Advised pt that she may have having a heart attack or other life threatening event and advised her to go to the ER downstairs at the [facility] right away.  She refuses for various logistical reasons but states that she will go to the ER in [place] later today.   Signed Lamar Blinks, MD

## 2016-03-16 DIAGNOSIS — I480 Paroxysmal atrial fibrillation: Secondary | ICD-10-CM | POA: Diagnosis not present

## 2016-03-19 DIAGNOSIS — R260 Ataxic gait: Secondary | ICD-10-CM | POA: Diagnosis not present

## 2016-03-19 DIAGNOSIS — M6281 Muscle weakness (generalized): Secondary | ICD-10-CM | POA: Diagnosis not present

## 2016-03-26 DIAGNOSIS — M6281 Muscle weakness (generalized): Secondary | ICD-10-CM | POA: Diagnosis not present

## 2016-03-26 DIAGNOSIS — R260 Ataxic gait: Secondary | ICD-10-CM | POA: Diagnosis not present

## 2016-03-29 DIAGNOSIS — M6281 Muscle weakness (generalized): Secondary | ICD-10-CM | POA: Diagnosis not present

## 2016-03-29 DIAGNOSIS — R260 Ataxic gait: Secondary | ICD-10-CM | POA: Diagnosis not present

## 2016-03-30 DIAGNOSIS — Z853 Personal history of malignant neoplasm of breast: Secondary | ICD-10-CM | POA: Diagnosis not present

## 2016-03-30 DIAGNOSIS — M940 Chondrocostal junction syndrome [Tietze]: Secondary | ICD-10-CM | POA: Diagnosis not present

## 2016-04-07 ENCOUNTER — Ambulatory Visit: Payer: Medicare Other

## 2016-04-07 ENCOUNTER — Ambulatory Visit (INDEPENDENT_AMBULATORY_CARE_PROVIDER_SITE_OTHER): Payer: Medicare Other | Admitting: Behavioral Health

## 2016-04-07 DIAGNOSIS — E538 Deficiency of other specified B group vitamins: Secondary | ICD-10-CM

## 2016-04-07 MED ORDER — CYANOCOBALAMIN 1000 MCG/ML IJ SOLN
1000.0000 ug | Freq: Once | INTRAMUSCULAR | Status: AC
  Administered 2016-04-07: 1000 ug via INTRAMUSCULAR

## 2016-04-07 NOTE — Progress Notes (Signed)
Pre visit review using our clinic review tool, if applicable. No additional management support is needed unless otherwise documented below in the visit note.  Patient in office today for B12 injection. IM given in Right Deltoid. Patient tolerated injection well. Next appointment is scheduled for 04/27/16 at 9:15 AM.

## 2016-04-27 ENCOUNTER — Ambulatory Visit (INDEPENDENT_AMBULATORY_CARE_PROVIDER_SITE_OTHER): Payer: Medicare Other | Admitting: Family Medicine

## 2016-04-27 ENCOUNTER — Encounter: Payer: Self-pay | Admitting: Family Medicine

## 2016-04-27 VITALS — BP 132/62 | HR 66 | Temp 97.8°F | Ht 60.0 in | Wt 131.1 lb

## 2016-04-27 DIAGNOSIS — R252 Cramp and spasm: Secondary | ICD-10-CM | POA: Diagnosis not present

## 2016-04-27 DIAGNOSIS — I482 Chronic atrial fibrillation, unspecified: Secondary | ICD-10-CM

## 2016-04-27 DIAGNOSIS — D509 Iron deficiency anemia, unspecified: Secondary | ICD-10-CM | POA: Diagnosis not present

## 2016-04-27 DIAGNOSIS — D500 Iron deficiency anemia secondary to blood loss (chronic): Secondary | ICD-10-CM

## 2016-04-27 DIAGNOSIS — I10 Essential (primary) hypertension: Secondary | ICD-10-CM

## 2016-04-27 DIAGNOSIS — E538 Deficiency of other specified B group vitamins: Secondary | ICD-10-CM | POA: Diagnosis not present

## 2016-04-27 DIAGNOSIS — K219 Gastro-esophageal reflux disease without esophagitis: Secondary | ICD-10-CM

## 2016-04-27 DIAGNOSIS — E785 Hyperlipidemia, unspecified: Secondary | ICD-10-CM | POA: Diagnosis not present

## 2016-04-27 DIAGNOSIS — I4891 Unspecified atrial fibrillation: Secondary | ICD-10-CM | POA: Diagnosis not present

## 2016-04-27 LAB — CBC
HCT: 32.7 % — ABNORMAL LOW (ref 36.0–46.0)
HEMOGLOBIN: 10.1 g/dL — AB (ref 12.0–15.0)
MCHC: 30.9 g/dL (ref 30.0–36.0)
PLATELETS: 452 10*3/uL — AB (ref 150.0–400.0)
RBC: 4.94 Mil/uL (ref 3.87–5.11)
RDW: 22.9 % — ABNORMAL HIGH (ref 11.5–15.5)
WBC: 6.5 10*3/uL (ref 4.0–10.5)

## 2016-04-27 LAB — LIPID PANEL
CHOLESTEROL: 135 mg/dL (ref 0–200)
HDL: 61.6 mg/dL (ref >39.00)
LDL CALC: 63 mg/dL (ref 0–99)
NonHDL: 73.31
Total CHOL/HDL Ratio: 2
Triglycerides: 51 mg/dL (ref 0.0–149.0)
VLDL: 10.2 mg/dL (ref 0.0–40.0)

## 2016-04-27 LAB — COMPREHENSIVE METABOLIC PANEL
ALBUMIN: 3.8 g/dL (ref 3.5–5.2)
ALK PHOS: 77 U/L (ref 39–117)
ALT: 11 U/L (ref 0–35)
AST: 17 U/L (ref 0–37)
BILIRUBIN TOTAL: 0.4 mg/dL (ref 0.2–1.2)
BUN: 13 mg/dL (ref 6–23)
CALCIUM: 9.8 mg/dL (ref 8.4–10.5)
CO2: 24 mEq/L (ref 19–32)
Chloride: 107 mEq/L (ref 96–112)
Creatinine, Ser: 0.69 mg/dL (ref 0.40–1.20)
GFR: 105.48 mL/min (ref >60.00)
Glucose, Bld: 79 mg/dL (ref 70–99)
Potassium: 3.8 mEq/L (ref 3.5–5.1)
Sodium: 140 mEq/L (ref 135–145)
TOTAL PROTEIN: 6.9 g/dL (ref 6.0–8.3)

## 2016-04-27 LAB — RETICULOCYTES
ABS Retic: 49200 cells/uL (ref 20000–80000)
RBC.: 4.92 MIL/uL (ref 3.80–5.10)
Retic Ct Pct: 1 %

## 2016-04-27 LAB — MAGNESIUM: MAGNESIUM: 1.6 mg/dL (ref 1.5–2.5)

## 2016-04-27 LAB — VITAMIN B12

## 2016-04-27 LAB — TSH: TSH: 1.36 u[IU]/mL (ref 0.35–4.50)

## 2016-04-27 LAB — FERRITIN: FERRITIN: 9.3 ng/mL — AB (ref 10.0–291.0)

## 2016-04-27 NOTE — Progress Notes (Signed)
Pre visit review using our clinic review tool, if applicable. No additional management support is needed unless otherwise documented below in the visit note. 

## 2016-04-27 NOTE — Patient Instructions (Addendum)
Compression stockings light weight knee highs, 10-20 mmhg, on in morning and off in evening Minimize sodium in diet less than 1500 mg daily  Elevate feet above heart a couple of times  Patient willing to take Prevnar with Vitamin B 12 shot next month  For constipation Miralax daily And/or increase hydration and fiber in diet. Daily probiotics. If bowels not moving can use MOM 2 tbls in 4 oz of warm prune juice by mouth every 2-3 days. If no results then repeat in 4 hours with  Dulcolax suppository pr, may repeat again in 4 more hours as needed. Seek care if symptoms worsen. Consider daily Miralax and/or Dulcolax if symptoms persist.    Peripheral Edema You have swelling in your legs (peripheral edema). This swelling is due to excess accumulation of salt and water in your body. Edema may be a sign of heart, kidney or liver disease, or a side effect of a medication. It may also be due to problems in the leg veins. Elevating your legs and using special support stockings may be very helpful, if the cause of the swelling is due to poor venous circulation. Avoid long periods of standing, whatever the cause. Treatment of edema depends on identifying the cause. Chips, pretzels, pickles and other salty foods should be avoided. Restricting salt in your diet is almost always needed. Water pills (diuretics) are often used to remove the excess salt and water from your body via urine. These medicines prevent the kidney from reabsorbing sodium. This increases urine flow. Diuretic treatment may also result in lowering of potassium levels in your body. Potassium supplements may be needed if you have to use diuretics daily. Daily weights can help you keep track of your progress in clearing your edema. You should call your caregiver for follow up care as recommended. SEEK IMMEDIATE MEDICAL CARE IF:   You have increased swelling, pain, redness, or heat in your legs.  You develop shortness of breath, especially when  lying down.  You develop chest or abdominal pain, weakness, or fainting.  You have a fever.   This information is not intended to replace advice given to you by your health care provider. Make sure you discuss any questions you have with your health care provider.   Document Released: 12/09/2004 Document Revised: 01/24/2012 Document Reviewed: 05/14/2015 Elsevier Interactive Patient Education [employer].

## 2016-05-06 DIAGNOSIS — J449 Chronic obstructive pulmonary disease, unspecified: Secondary | ICD-10-CM | POA: Diagnosis not present

## 2016-05-06 DIAGNOSIS — I2510 Atherosclerotic heart disease of native coronary artery without angina pectoris: Secondary | ICD-10-CM | POA: Diagnosis not present

## 2016-05-06 DIAGNOSIS — E785 Hyperlipidemia, unspecified: Secondary | ICD-10-CM | POA: Diagnosis not present

## 2016-05-06 DIAGNOSIS — I059 Rheumatic mitral valve disease, unspecified: Secondary | ICD-10-CM | POA: Diagnosis not present

## 2016-05-06 DIAGNOSIS — I480 Paroxysmal atrial fibrillation: Secondary | ICD-10-CM | POA: Diagnosis not present

## 2016-05-06 DIAGNOSIS — I10 Essential (primary) hypertension: Secondary | ICD-10-CM | POA: Diagnosis not present

## 2016-05-09 DIAGNOSIS — R252 Cramp and spasm: Secondary | ICD-10-CM | POA: Insufficient documentation

## 2016-05-09 NOTE — Assessment & Plan Note (Signed)
Given B12 shot today

## 2016-05-09 NOTE — Assessment & Plan Note (Signed)
Well controlled, no changes to meds. Encouraged heart healthy diet such as the DASH diet and exercise as tolerated.  °

## 2016-05-09 NOTE — Assessment & Plan Note (Signed)
Encouraged increased hydration, magnesium daily. hyland's leg cramp medication.

## 2016-05-09 NOTE — Assessment & Plan Note (Signed)
Stable and feeling well. Continue iron supplements and will continue to monitor

## 2016-05-09 NOTE — Progress Notes (Signed)
Patient ID: Yesenia Woods, female   DOB: 06/09/1935, 81 y.o.   MRN: [id number]   Subjective:    Patient ID: Yesenia Woods, female    DOB: May 24, 1935, 81 y.o.   MRN: [id number]  Chief Complaint  Patient presents with  . Follow-up    HPI Patient is in today for follow up. She is feeling well today. Is noting some trouble with constipation. Has to strain to move her bowels every few days. No bloody or tarry stool. Continues to maintain O2 via [place] follows with Pulmonologist in [place], Dr Donovan Kail. Denies CP/palp/HA/congestion/fevers/GI or GU c/o. Taking meds as prescribed  Past Medical History  Diagnosis Date  . Essential hypertension   . Anemia   . Headache(784.0)   . Arthritis   . Duodenal ulcer   . Small bowel arteriovenous malformation   . Hyperlipidemia   . COPD (chronic obstructive pulmonary disease) ([place])   . Diverticulitis   . Atrial fibrillation ([place])   . Oxygen dependent   . CAD (coronary artery disease)     Status post LIMA to LAD at [facility] 04/1989  . Breast cancer ([place]) 12/09/2014    Mastectomy on left, chemo x 3 doses  . B12 deficiency 04/10/2015  . Chronic respiratory failure with hypoxia ([place])   . Falls 01/18/2016    Past Surgical History  Procedure Laterality Date  . Coronary artery bypass graft  1990    [facility] - LIMA to LAD  . Eye surgery    . Mastectomy Left   . Hand surgery Right   . Esophagogastroduodenoscopy  07/22/2011    Procedure: ESOPHAGOGASTRODUODENOSCOPY (EGD);  Surgeon: Rogene Houston, MD;  [facility];  Service: Endoscopy;  Laterality: N/A;  . Colonoscopy  07/22/2011    Procedure: COLONOSCOPY;  Surgeon: Rogene Houston, MD;  [facility];  Service: Endoscopy;  Laterality: N/A;  . Givens capsule study  07/30/2011    Procedure: GIVENS CAPSULE STUDY;  Surgeon: Rogene Houston, MD;  [facility];  Service: Endoscopy;  Laterality: N/A;  7:30  . Esophagogastroduodenoscopy N/A 04/23/2013    Procedure:  ESOPHAGOGASTRODUODENOSCOPY (EGD);  Surgeon: Rogene Houston, MD;  [facility];  Service: Endoscopy;  Laterality: N/A;    Family History  Problem Relation Age of Onset  . Prostate cancer Brother   . Prostate cancer Brother   . Breast cancer Daughter     Breast Cancer that mets  . Throat cancer Daughter   . Healthy Daughter   . Healthy Son   . Diabetes Son   . Hypertension Son   . Healthy Son   . Healthy Son   . Healthy Son   . Heart disease Son     Cardiac arrythmia, with defibrillator  . Healthy Son   . Prostate cancer Son   . CAD Son     Social History   Social History  . Marital Status: Widowed    Spouse Name: N/A  . Number of Children: N/A  . Years of Education: N/A   Occupational History  . Not on file.   Social History Main Topics  . Smoking status: Former Smoker -- 1.00 packs/day for 30 years    Types: Cigarettes    Quit date: 10/14/1994  . Smokeless tobacco: Never Used     Comment: Patient quit smoking 25 years ago  . Alcohol Use: No     Comment: none for 8 months, wine occasionally  . Drug Use: No  .  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    Other Topics Concern  . Not on file   Social History Narrative    Outpatient Prescriptions Prior to Visit  Medication Sig Dispense Refill  . acetaminophen (TYLENOL) 500 MG tablet Take 1,000 mg by mouth daily as needed for moderate pain.    Marland Kitchen amLODipine (NORVASC) 5 MG tablet Take 1 tablet (5 mg total) by mouth daily. 30 tablet 1  . aspirin EC 81 MG tablet Take 81 mg by mouth daily.    Marland Kitchen BREO ELLIPTA 100-25 MCG/INH AEPB Inhale 1 puff into the lungs daily.     . ferrous sulfate 325 (65 FE) MG tablet Take 1 tablet (325 mg total) by mouth 2 (two) times daily with a meal. (Patient taking differently: Take 325 mg by mouth daily with breakfast. ) 30 tablet 3  . furosemide (LASIX) 40 MG tablet Take 1 tablet (40 mg total) by mouth daily as needed  for fluid or edema. 30 tablet 0  . hydroxypropyl methylcellulose / hypromellose (ISOPTO TEARS / GONIOVISC) 2.5 % ophthalmic solution Place 1 drop into both eyes daily as needed for dry eyes.    Marland Kitchen ipratropium-albuterol (DUONEB) 0.5-2.5 (3) MG/3ML SOLN Take 3 mLs by nebulization every 6 (six) hours as needed (shortness of breath).     . isosorbide mononitrate (IMDUR) 30 MG 24 hr tablet Take 30 mg by mouth 2 (two) times daily.     . nitroGLYCERIN (NITROSTAT) 0.4 MG SL tablet Place 1 tablet (0.4 mg total) under the tongue every 5 (five) minutes as needed for chest pain. If no relief after 3 doses to ER 25 tablet 3  . omeprazole (PRILOSEC) 20 MG capsule Take 1 capsule by mouth daily.    Marland Kitchen PROAIR HFA 108 (90 BASE) MCG/ACT inhaler Inhale 2 puffs into the lungs every 6 (six) hours as needed for wheezing or shortness of breath.     . warfarin (COUMADIN) 4 MG tablet Take 1 tablet (4 mg total) by mouth daily. Take as directed. Take 4 mg on Monday and 7.5 mg Tuesday-Sunday. (Patient taking differently: Take 4 mg by mouth daily. Patient takes 7.72m on Monday and 448mall other days)    . warfarin (COUMADIN) 7.5 MG tablet Take 7.5 mg by mouth daily. Patient take 7.76m14mn Monday only and 4mg32ml other days    . cyanocobalamin ((VITAMIN B-12)) injection 1,000 mcg      No facility-administered medications prior to visit.    No Known Allergies  Review of Systems  Constitutional: Positive for malaise/fatigue. Negative for fever.  HENT: Negative for congestion.   Eyes: Negative for blurred vision.  Respiratory: Negative for shortness of breath.   Cardiovascular: Negative for chest pain, palpitations and leg swelling.  Gastrointestinal: Negative for nausea, abdominal pain and blood in stool.  Genitourinary: Negative for dysuria and frequency.  Musculoskeletal: Negative for falls.  Skin: Negative for rash.  Neurological: Negative for dizziness, loss of consciousness and headaches.  Endo/Heme/Allergies: Negative  for environmental allergies.  Psychiatric/Behavioral: Negative for depression. The patient is not nervous/anxious.        Objective:    Physical Exam  Constitutional: She is oriented to person, place, and time. She appears well-developed and well-nourished. No distress.  HENT:  Head: Normocephalic and atraumatic.  Nose: Nose normal.  Eyes: Right eye exhibits no discharge. Left eye exhibits no discharge.  Neck: Normal range of motion. Neck supple.  Cardiovascular: Normal  rate and regular rhythm.   No murmur heard. Pulmonary/Chest: Effort normal and breath sounds normal.  Abdominal: Soft. Bowel sounds are normal. There is no tenderness.  Musculoskeletal: She exhibits no edema.  Neurological: She is alert and oriented to person, place, and time.  Skin: Skin is warm and dry.  Psychiatric: She has a normal mood and affect.  Nursing note and vitals reviewed.   BP 132/62 mmHg  Pulse 66  Temp(Src) 97.8 F (36.6 C) (Oral)  Ht 5' (1.524 m)  Wt 131 lb 2 oz (59.478 kg)  BMI 25.61 kg/m2  SpO2 94% Wt Readings from Last 3 Encounters:  04/27/16 131 lb 2 oz (59.478 kg)  03/10/16 130 lb (58.968 kg)  03/10/16 130 lb 12.8 oz (59.33 kg)     Lab Results  Component Value Date   WBC 6.5 04/27/2016   HGB 10.1* 04/27/2016   HCT 32.7* 04/27/2016   PLT 452.0* 04/27/2016   GLUCOSE 79 04/27/2016   CHOL 135 04/27/2016   TRIG 51.0 04/27/2016   HDL 61.60 04/27/2016   LDLCALC 63 04/27/2016   ALT 11 04/27/2016   AST 17 04/27/2016   NA 140 04/27/2016   K 3.8 04/27/2016   CL 107 04/27/2016   CREATININE 0.69 04/27/2016   BUN 13 04/27/2016   CO2 24 04/27/2016   TSH 1.36 04/27/2016   INR 2.41* 06/04/2015   HGBA1C 6.2* 07/19/2011    Lab Results  Component Value Date   TSH 1.36 04/27/2016   Lab Results  Component Value Date   WBC 6.5 04/27/2016   HGB 10.1* 04/27/2016   HCT 32.7* 04/27/2016   MCV 66.2 Repeated and verified X2.* 04/27/2016   PLT 452.0* 04/27/2016   Lab Results    Component Value Date   NA 140 04/27/2016   K 3.8 04/27/2016   CO2 24 04/27/2016   GLUCOSE 79 04/27/2016   BUN 13 04/27/2016   CREATININE 0.69 04/27/2016   BILITOT 0.4 04/27/2016   ALKPHOS 77 04/27/2016   AST 17 04/27/2016   ALT 11 04/27/2016   PROT 6.9 04/27/2016   ALBUMIN 3.8 04/27/2016   CALCIUM 9.8 04/27/2016   ANIONGAP 8 03/10/2016   GFR 105.48 04/27/2016   Lab Results  Component Value Date   CHOL 135 04/27/2016   Lab Results  Component Value Date   HDL 61.60 04/27/2016   Lab Results  Component Value Date   LDLCALC 63 04/27/2016   Lab Results  Component Value Date   TRIG 51.0 04/27/2016   Lab Results  Component Value Date   CHOLHDL 2 04/27/2016   Lab Results  Component Value Date   HGBA1C 6.2* 07/19/2011       Assessment & Plan:   Problem List Items Addressed This Visit    Muscle cramp    Encouraged increased hydration, magnesium daily. hyland's leg cramp medication.       Relevant Orders   Magnesium (Completed)   IDA (iron deficiency anemia)    Stable and feeling well. Continue iron supplements and will continue to monitor      Relevant Orders   TSH (Completed)   CBC (Completed)   Retic (Completed)   Comp Met (CMET) (Completed)   Vitamin B12 (Completed)   Ferritin (Completed)   Lipid panel (Completed)   Hyperlipidemia    Encouraged heart healthy diet, increase exercise, avoid trans fats, consider a krill oil cap daily      Relevant Orders   TSH (Completed)   CBC (Completed)   Retic (Completed)  Comp Met (CMET) (Completed)   Vitamin B12 (Completed)   Ferritin (Completed)   Lipid panel (Completed)   HTN (hypertension) (Chronic)    Well controlled, no changes to meds. Encouraged heart healthy diet such as the DASH diet and exercise as tolerated.       Relevant Orders   TSH (Completed)   CBC (Completed)   Retic (Completed)   Comp Met (CMET) (Completed)   Vitamin B12 (Completed)   Ferritin (Completed)   Lipid panel (Completed)    GERD (gastroesophageal reflux disease)    Avoid offending foods, start probiotics. Do not eat large meals in late evening and consider raising head of bed.       B12 deficiency    Given B12 shot today      Relevant Orders   TSH (Completed)   CBC (Completed)   Retic (Completed)   Comp Met (CMET) (Completed)   Vitamin B12 (Completed)   Ferritin (Completed)   Lipid panel (Completed)   Atrial fibrillation (HCC)   Relevant Orders   TSH (Completed)   CBC (Completed)   Retic (Completed)   Comp Met (CMET) (Completed)   Vitamin B12 (Completed)   Ferritin (Completed)   Lipid panel (Completed)   Anemia - Primary   Relevant Orders   TSH (Completed)   CBC (Completed)   Retic (Completed)   Comp Met (CMET) (Completed)   Vitamin B12 (Completed)   Ferritin (Completed)   Lipid panel (Completed)      I am having Ms. Babino maintain her BREO ELLIPTA, PROAIR HFA, isosorbide mononitrate, acetaminophen, furosemide, ipratropium-albuterol, omeprazole, nitroGLYCERIN, aspirin EC, warfarin, hydroxypropyl methylcellulose / hypromellose, amLODipine, ferrous sulfate, and warfarin. We administered cyanocobalamin.  No orders of the defined types were placed in this encounter.     Penni Homans, MD

## 2016-05-09 NOTE — Assessment & Plan Note (Signed)
Encouraged heart healthy diet, increase exercise, avoid trans fats, consider a krill oil cap daily 

## 2016-05-09 NOTE — Assessment & Plan Note (Signed)
Avoid offending foods, start probiotics. Do not eat large meals in late evening and consider raising head of bed.  

## 2016-05-27 ENCOUNTER — Ambulatory Visit (INDEPENDENT_AMBULATORY_CARE_PROVIDER_SITE_OTHER): Payer: Medicare Other | Admitting: Behavioral Health

## 2016-05-27 DIAGNOSIS — E538 Deficiency of other specified B group vitamins: Secondary | ICD-10-CM | POA: Diagnosis not present

## 2016-05-27 MED ORDER — CYANOCOBALAMIN 1000 MCG/ML IJ SOLN
1000.0000 ug | Freq: Once | INTRAMUSCULAR | Status: AC
  Administered 2016-05-27: 1000 ug via INTRAMUSCULAR

## 2016-05-27 NOTE — Progress Notes (Signed)
Pre visit review using our clinic review tool, if applicable. No additional management support is needed unless otherwise documented below in the visit note.  Patient in office for B12 injection. IM given in Right Deltoid. Patient tolerated injection well. Next appointment has already been scheduled on 07/05/16 with CPE.

## 2016-06-18 DIAGNOSIS — I4891 Unspecified atrial fibrillation: Secondary | ICD-10-CM | POA: Diagnosis not present

## 2016-06-28 DIAGNOSIS — J449 Chronic obstructive pulmonary disease, unspecified: Secondary | ICD-10-CM | POA: Diagnosis not present

## 2016-06-28 DIAGNOSIS — J9611 Chronic respiratory failure with hypoxia: Secondary | ICD-10-CM | POA: Diagnosis not present

## 2016-06-28 DIAGNOSIS — I480 Paroxysmal atrial fibrillation: Secondary | ICD-10-CM | POA: Diagnosis not present

## 2016-07-02 DIAGNOSIS — I059 Rheumatic mitral valve disease, unspecified: Secondary | ICD-10-CM | POA: Diagnosis not present

## 2016-07-02 DIAGNOSIS — E785 Hyperlipidemia, unspecified: Secondary | ICD-10-CM | POA: Diagnosis not present

## 2016-07-02 DIAGNOSIS — I2510 Atherosclerotic heart disease of native coronary artery without angina pectoris: Secondary | ICD-10-CM | POA: Diagnosis not present

## 2016-07-02 DIAGNOSIS — I369 Nonrheumatic tricuspid valve disorder, unspecified: Secondary | ICD-10-CM | POA: Diagnosis not present

## 2016-07-02 DIAGNOSIS — I10 Essential (primary) hypertension: Secondary | ICD-10-CM | POA: Diagnosis not present

## 2016-07-02 DIAGNOSIS — I480 Paroxysmal atrial fibrillation: Secondary | ICD-10-CM | POA: Diagnosis not present

## 2016-07-05 ENCOUNTER — Ambulatory Visit (INDEPENDENT_AMBULATORY_CARE_PROVIDER_SITE_OTHER): Payer: Medicare Other | Admitting: Family Medicine

## 2016-07-05 ENCOUNTER — Encounter: Payer: Self-pay | Admitting: Family Medicine

## 2016-07-05 VITALS — BP 112/72 | HR 67 | Temp 97.7°F | Ht 60.0 in | Wt 132.0 lb

## 2016-07-05 DIAGNOSIS — I10 Essential (primary) hypertension: Secondary | ICD-10-CM

## 2016-07-05 DIAGNOSIS — D649 Anemia, unspecified: Secondary | ICD-10-CM

## 2016-07-05 DIAGNOSIS — Z23 Encounter for immunization: Secondary | ICD-10-CM

## 2016-07-05 DIAGNOSIS — E785 Hyperlipidemia, unspecified: Secondary | ICD-10-CM | POA: Diagnosis not present

## 2016-07-05 DIAGNOSIS — J439 Emphysema, unspecified: Secondary | ICD-10-CM

## 2016-07-05 LAB — CBC
HEMATOCRIT: 36.6 % (ref 36.0–46.0)
HEMOGLOBIN: 11.5 g/dL — AB (ref 12.0–15.0)
MCHC: 31.4 g/dL (ref 30.0–36.0)
Platelets: 439 10*3/uL — ABNORMAL HIGH (ref 150.0–400.0)
RBC: 5.47 Mil/uL — AB (ref 3.87–5.11)
RDW: 23.2 % — ABNORMAL HIGH (ref 11.5–15.5)
WBC: 7.6 10*3/uL (ref 4.0–10.5)

## 2016-07-05 LAB — COMPREHENSIVE METABOLIC PANEL
ALBUMIN: 4.1 g/dL (ref 3.5–5.2)
ALT: 10 U/L (ref 0–35)
AST: 14 U/L (ref 0–37)
Alkaline Phosphatase: 114 U/L (ref 39–117)
BUN: 10 mg/dL (ref 6–23)
CALCIUM: 9.9 mg/dL (ref 8.4–10.5)
CHLORIDE: 106 meq/L (ref 96–112)
CO2: 25 mEq/L (ref 19–32)
Creatinine, Ser: 0.67 mg/dL (ref 0.40–1.20)
GFR: 109.07 mL/min (ref >60.00)
Glucose, Bld: 84 mg/dL (ref 70–99)
POTASSIUM: 3.8 meq/L (ref 3.5–5.1)
SODIUM: 141 meq/L (ref 135–145)
Total Bilirubin: 0.5 mg/dL (ref 0.2–1.2)
Total Protein: 7.5 g/dL (ref 6.0–8.3)

## 2016-07-05 LAB — RETICULOCYTES
ABS RETIC: 42880 {cells}/uL (ref 20000–80000)
RBC.: 5.36 MIL/uL — AB (ref 3.80–5.10)
RETIC CT PCT: 0.8 %

## 2016-07-05 MED ORDER — CYANOCOBALAMIN 1000 MCG/ML IJ SOLN
1000.0000 ug | Freq: Once | INTRAMUSCULAR | Status: AC
  Administered 2016-07-05: 1000 ug via INTRAMUSCULAR

## 2016-07-05 NOTE — Assessment & Plan Note (Signed)
Encouraged heart healthy diet, increase exercise, avoid trans fats, consider a krill oil cap daily 

## 2016-07-05 NOTE — Assessment & Plan Note (Signed)
Well controlled, no changes to meds. Encouraged heart healthy diet such as the DASH diet and exercise as tolerated.  °

## 2016-07-05 NOTE — Patient Instructions (Signed)
Anemia, Nonspecific Anemia is a condition in which the concentration of red blood cells or hemoglobin in the blood is below normal. Hemoglobin is a substance in red blood cells that carries oxygen to the tissues of the body. Anemia results in not enough oxygen reaching these tissues.  CAUSES  Common causes of anemia include:   Excessive bleeding. Bleeding may be internal or external. This includes excessive bleeding from periods (in women) or from the intestine.   Poor nutrition.   Chronic kidney, thyroid, and liver disease.  Bone marrow disorders that decrease red blood cell production.  Cancer and treatments for cancer.  HIV, AIDS, and their treatments.  Spleen problems that increase red blood cell destruction.  Blood disorders.  Excess destruction of red blood cells due to infection, medicines, and autoimmune disorders. SIGNS AND SYMPTOMS   Minor weakness.   Dizziness.   Headache.  Palpitations.   Shortness of breath, especially with exercise.   Paleness.  Cold sensitivity.  Indigestion.  Nausea.  Difficulty sleeping.  Difficulty concentrating. Symptoms may occur suddenly or they may develop slowly.  DIAGNOSIS  Additional blood tests are often needed. These help your health care provider determine the best treatment. Your health care provider will check your stool for blood and look for other causes of blood loss.  TREATMENT  Treatment varies depending on the cause of the anemia. Treatment can include:   Supplements of iron, vitamin B12, or folic acid.   Hormone medicines.   A blood transfusion. This may be needed if blood loss is severe.   Hospitalization. This may be needed if there is significant continual blood loss.   Dietary changes.  Spleen removal. HOME CARE INSTRUCTIONS Keep all follow-up appointments. It often takes many weeks to correct anemia, and having your health care provider check on your condition and your response to  treatment is very important. SEEK IMMEDIATE MEDICAL CARE IF:   You develop extreme weakness, shortness of breath, or chest pain.   You become dizzy or have trouble concentrating.  You develop heavy vaginal bleeding.   You develop a rash.   You have bloody or black, tarry stools.   You faint.   You vomit up blood.   You vomit repeatedly.   You have abdominal pain.  You have a fever or persistent symptoms for more than 2-3 days.   You have a fever and your symptoms suddenly get worse.   You are dehydrated.  MAKE SURE YOU:  Understand these instructions.  Will watch your condition.  Will get help right away if you are not doing well or get worse.   This information is not intended to replace advice given to you by your health care provider. Make sure you discuss any questions you have with your health care provider.   Document Released: 12/09/2004 Document Revised: 07/04/2013 Document Reviewed: 04/27/2013 Elsevier Interactive Patient Education 2016 Elsevier Inc.  

## 2016-07-05 NOTE — Progress Notes (Signed)
Pre visit review using our clinic review tool, if applicable. No additional management support is needed unless otherwise documented below in the visit note. 

## 2016-07-06 DIAGNOSIS — R0602 Shortness of breath: Secondary | ICD-10-CM | POA: Diagnosis not present

## 2016-07-06 DIAGNOSIS — I059 Rheumatic mitral valve disease, unspecified: Secondary | ICD-10-CM | POA: Diagnosis not present

## 2016-07-06 DIAGNOSIS — I079 Rheumatic tricuspid valve disease, unspecified: Secondary | ICD-10-CM | POA: Diagnosis not present

## 2016-07-09 DIAGNOSIS — R079 Chest pain, unspecified: Secondary | ICD-10-CM | POA: Diagnosis not present

## 2016-07-09 DIAGNOSIS — I2510 Atherosclerotic heart disease of native coronary artery without angina pectoris: Secondary | ICD-10-CM | POA: Diagnosis not present

## 2016-07-09 DIAGNOSIS — R0602 Shortness of breath: Secondary | ICD-10-CM | POA: Diagnosis not present

## 2016-07-14 NOTE — Assessment & Plan Note (Signed)
Following closely with pulmonology and uses oxygen via [place] routinely

## 2016-07-14 NOTE — Progress Notes (Signed)
Patient ID: Yesenia Woods, female   DOB: 06/06/1935, 81 y.o.   MRN: [id number]   Subjective:    Patient ID: Yesenia Woods, female    DOB: 1935-03-10, 81 y.o.   MRN: [id number]  Chief Complaint  Patient presents with  . Follow-up    HPI Patient is in today for follow up. She is noting fatigue but no other acute complaints. She is doing well with ADLs, no falls or new concerns. Denies CP/palp/SOB/HA/congestion/fevers/GI or GU c/o. Taking meds as prescribed  Past Medical History:  Diagnosis Date  . Anemia   . Arthritis   . Atrial fibrillation ([place])   . B12 deficiency 04/10/2015  . Breast cancer ([place]) 12/09/2014   Mastectomy on left, chemo x 3 doses  . CAD (coronary artery disease)    Status post LIMA to LAD at [facility] 04/1989  . Chronic respiratory failure with hypoxia ([place])   . COPD (chronic obstructive pulmonary disease) ([place])   . Diverticulitis   . Duodenal ulcer   . Essential hypertension   . Falls 01/18/2016  . Headache(784.0)   . Hyperlipidemia   . Oxygen dependent   . Small bowel arteriovenous malformation     Past Surgical History:  Procedure Laterality Date  . COLONOSCOPY  07/22/2011   Procedure: COLONOSCOPY;  Surgeon: Rogene Houston, MD;  [facility];  Service: Endoscopy;  Laterality: N/A;  . CORONARY ARTERY BYPASS GRAFT  1990   [facility] - LIMA to LAD  . ESOPHAGOGASTRODUODENOSCOPY  07/22/2011   Procedure: ESOPHAGOGASTRODUODENOSCOPY (EGD);  Surgeon: Rogene Houston, MD;  [facility];  Service: Endoscopy;  Laterality: N/A;  . ESOPHAGOGASTRODUODENOSCOPY N/A 04/23/2013   Procedure: ESOPHAGOGASTRODUODENOSCOPY (EGD);  Surgeon: Rogene Houston, MD;  [facility];  Service: Endoscopy;  Laterality: N/A;  . EYE SURGERY    . GIVENS CAPSULE STUDY  07/30/2011   Procedure: GIVENS CAPSULE STUDY;  Surgeon: Rogene Houston, MD;  [facility];  Service: Endoscopy;  Laterality: N/A;  7:30  . Hand surgery Right   . MASTECTOMY Left     Family  History  Problem Relation Age of Onset  . Prostate cancer Brother   . Prostate cancer Brother   . Breast cancer Daughter     Breast Cancer that mets  . Throat cancer Daughter   . Healthy Daughter   . Healthy Son   . Diabetes Son   . Hypertension Son   . Healthy Son   . Healthy Son   . Healthy Son   . Heart disease Son     Cardiac arrythmia, with defibrillator  . Healthy Son   . Prostate cancer Son   . CAD Son     Social History   Social History  . Marital status: Widowed    Spouse name: N/A  . Number of children: N/A  . Years of education: N/A   Occupational History  . Not on file.   Social History Main Topics  . Smoking status: Former Smoker    Packs/day: 1.00    Years: 30.00    Types: Cigarettes    Quit date: 10/14/1994  . Smokeless tobacco: Never Used     Comment: Patient quit smoking 25 years ago  . Alcohol use No     Comment: none for 8 months, wine occasionally  . Drug use: No  . 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    Other Topics  Concern  . Not on file   Social History Narrative  . No narrative on file    Outpatient Medications Prior to Visit  Medication Sig Dispense Refill  . acetaminophen (TYLENOL) 500 MG tablet Take 1,000 mg by mouth daily as needed for moderate pain.    Marland Kitchen amLODipine (NORVASC) 5 MG tablet Take 1 tablet (5 mg total) by mouth daily. 30 tablet 1  . aspirin EC 81 MG tablet Take 81 mg by mouth daily.    Marland Kitchen BREO ELLIPTA 100-25 MCG/INH AEPB Inhale 1 puff into the lungs daily.     . ferrous sulfate 325 (65 FE) MG tablet Take 1 tablet (325 mg total) by mouth 2 (two) times daily with a meal. (Patient taking differently: Take 325 mg by mouth daily with breakfast. ) 30 tablet 3  . furosemide (LASIX) 40 MG tablet Take 1 tablet (40 mg total) by mouth daily as needed for fluid or edema. 30 tablet 0  . hydroxypropyl methylcellulose / hypromellose (ISOPTO TEARS / GONIOVISC) 2.5 %  ophthalmic solution Place 1 drop into both eyes daily as needed for dry eyes.    Marland Kitchen ipratropium-albuterol (DUONEB) 0.5-2.5 (3) MG/3ML SOLN Take 3 mLs by nebulization every 6 (six) hours as needed (shortness of breath).     . isosorbide mononitrate (IMDUR) 30 MG 24 hr tablet Take 30 mg by mouth 2 (two) times daily.     . nitroGLYCERIN (NITROSTAT) 0.4 MG SL tablet Place 1 tablet (0.4 mg total) under the tongue every 5 (five) minutes as needed for chest pain. If no relief after 3 doses to ER 25 tablet 3  . omeprazole (PRILOSEC) 20 MG capsule Take 1 capsule by mouth daily.    Marland Kitchen PROAIR HFA 108 (90 BASE) MCG/ACT inhaler Inhale 2 puffs into the lungs every 6 (six) hours as needed for wheezing or shortness of breath.     . warfarin (COUMADIN) 4 MG tablet Take 1 tablet (4 mg total) by mouth daily. Take as directed. Take 4 mg on Monday and 7.5 mg Tuesday-Sunday. (Patient taking differently: Take 4 mg by mouth daily. Patient takes 7.'5mg'$  on Monday and '4mg'$  all other days)    . warfarin (COUMADIN) 7.5 MG tablet Take 7.5 mg by mouth daily. Patient take 7.'5mg'$  on Monday only and '4mg'$  all other days     No facility-administered medications prior to visit.     No Known Allergies  Review of Systems  Constitutional: Positive for malaise/fatigue. Negative for fever.  HENT: Negative for congestion.   Eyes: Negative for blurred vision.  Respiratory: Positive for shortness of breath.   Cardiovascular: Negative for chest pain, palpitations and leg swelling.  Gastrointestinal: Positive for heartburn. Negative for abdominal pain, blood in stool and nausea.  Genitourinary: Negative for dysuria and frequency.  Musculoskeletal: Positive for myalgias. Negative for falls.  Skin: Negative for rash.  Neurological: Negative for dizziness, loss of consciousness and headaches.  Endo/Heme/Allergies: Negative for environmental allergies.  Psychiatric/Behavioral: Negative for depression. The patient is not nervous/anxious.          Objective:    Physical Exam  Constitutional: She is oriented to person, place, and time. She appears well-developed and well-nourished. No distress.  HENT:  Head: Normocephalic and atraumatic.  Nose: Nose normal.  Eyes: Right eye exhibits no discharge. Left eye exhibits no discharge.  Neck: Normal range of motion. Neck supple.  Cardiovascular: Normal rate.   No murmur heard. Pulmonary/Chest: Effort normal and breath sounds normal.  Abdominal: Soft. Bowel sounds are  normal. There is no tenderness.  Musculoskeletal: She exhibits no edema.  Neurological: She is alert and oriented to person, place, and time.  Skin: Skin is warm and dry.  Psychiatric: She has a normal mood and affect.  Nursing note and vitals reviewed.   BP 112/72 (BP Location: Right Arm, Patient Position: Sitting, Cuff Size: Normal)   Pulse 67   Temp 97.7 F (36.5 C) (Oral)   Ht 5' (1.524 m)   Wt 132 lb (59.9 kg)   SpO2 95%   BMI 25.78 kg/m  Wt Readings from Last 3 Encounters:  07/05/16 132 lb (59.9 kg)  04/27/16 131 lb 2 oz (59.5 kg)  03/10/16 130 lb (59 kg)     Lab Results  Component Value Date   WBC 7.6 07/05/2016   HGB 11.5 (L) 07/05/2016   HCT 36.6 07/05/2016   PLT 439.0 (H) 07/05/2016   GLUCOSE 84 07/05/2016   CHOL 135 04/27/2016   TRIG 51.0 04/27/2016   HDL 61.60 04/27/2016   LDLCALC 63 04/27/2016   ALT 10 07/05/2016   AST 14 07/05/2016   NA 141 07/05/2016   K 3.8 07/05/2016   CL 106 07/05/2016   CREATININE 0.67 07/05/2016   BUN 10 07/05/2016   CO2 25 07/05/2016   TSH 1.36 04/27/2016   INR 2.41 (H) 06/04/2015   HGBA1C 6.2 (H) 07/19/2011    Lab Results  Component Value Date   TSH 1.36 04/27/2016   Lab Results  Component Value Date   WBC 7.6 07/05/2016   HGB 11.5 (L) 07/05/2016   HCT 36.6 07/05/2016   MCV 66.8 Repeated and verified X2. (L) 07/05/2016   PLT 439.0 (H) 07/05/2016   Lab Results  Component Value Date   NA 141 07/05/2016   K 3.8 07/05/2016   CO2 25 07/05/2016    GLUCOSE 84 07/05/2016   BUN 10 07/05/2016   CREATININE 0.67 07/05/2016   BILITOT 0.5 07/05/2016   ALKPHOS 114 07/05/2016   AST 14 07/05/2016   ALT 10 07/05/2016   PROT 7.5 07/05/2016   ALBUMIN 4.1 07/05/2016   CALCIUM 9.9 07/05/2016   ANIONGAP 8 03/10/2016   GFR 109.07 07/05/2016   Lab Results  Component Value Date   CHOL 135 04/27/2016   Lab Results  Component Value Date   HDL 61.60 04/27/2016   Lab Results  Component Value Date   LDLCALC 63 04/27/2016   Lab Results  Component Value Date   TRIG 51.0 04/27/2016   Lab Results  Component Value Date   CHOLHDL 2 04/27/2016   Lab Results  Component Value Date   HGBA1C 6.2 (H) 07/19/2011       Assessment & Plan:   Problem List Items Addressed This Visit    HTN (hypertension) - Primary (Chronic)    Well controlled, no changes to meds. Encouraged heart healthy diet such as the DASH diet and exercise as tolerated.       Relevant Orders   CBC (Completed)   Comp Met (CMET) (Completed)   Anemia    Stable no new concerns.       Relevant Medications   cyanocobalamin ((VITAMIN B-12)) injection 1,000 mcg (Completed)   Other Relevant Orders   Retic (Completed)   Hyperlipidemia    Encouraged heart healthy diet, increase exercise, avoid trans fats, consider a krill oil cap daily      COPD (chronic obstructive pulmonary disease) ([place])    Following closely with pulmonology and uses oxygen via [place] routinely  Other Visit Diagnoses    Need for TD vaccine       Relevant Orders   Td : Tetanus/diphtheria >7yo Preservative  free (Completed)      I am having Ms. Delcarlo maintain her BREO ELLIPTA, PROAIR HFA, isosorbide mononitrate, acetaminophen, furosemide, ipratropium-albuterol, omeprazole, nitroGLYCERIN, aspirin EC, warfarin, hydroxypropyl methylcellulose / hypromellose, amLODipine, ferrous sulfate, and warfarin. We administered cyanocobalamin.  Meds ordered this encounter  Medications  . cyanocobalamin  ((VITAMIN B-12)) injection 1,000 mcg     Penni Homans, MD

## 2016-07-14 NOTE — Assessment & Plan Note (Signed)
Stable no new concerns.

## 2016-07-23 DIAGNOSIS — I4891 Unspecified atrial fibrillation: Secondary | ICD-10-CM | POA: Diagnosis not present

## 2016-08-02 ENCOUNTER — Encounter (INDEPENDENT_AMBULATORY_CARE_PROVIDER_SITE_OTHER): Payer: Self-pay | Admitting: *Deleted

## 2016-08-03 ENCOUNTER — Ambulatory Visit (INDEPENDENT_AMBULATORY_CARE_PROVIDER_SITE_OTHER): Payer: Medicare Other | Admitting: Behavioral Health

## 2016-08-03 DIAGNOSIS — E538 Deficiency of other specified B group vitamins: Secondary | ICD-10-CM | POA: Diagnosis not present

## 2016-08-03 MED ORDER — CYANOCOBALAMIN 1000 MCG/ML IJ SOLN
1000.0000 ug | Freq: Once | INTRAMUSCULAR | Status: AC
  Administered 2016-08-03: 1000 ug via INTRAMUSCULAR

## 2016-08-03 NOTE — Progress Notes (Signed)
Pre visit review using our clinic review tool, if applicable. No additional management support is needed unless otherwise documented below in the visit note.  Patient in office today for B12 injection. IM given in Right Deltoid. Patient tolerated injection well.  Next appointment is 09/02/16 at 9:15 AM.

## 2016-08-14 IMAGING — CT CT ANGIO CHEST
2 of 6 series · 6 of 36 positions shown · IV contrast (Omnipaque 300)
Comparison: Chest radiograph 04/07/2015

CLINICAL DATA: 80-year-old with chest pain and shortness of breath
for 1 week.

EXAM:
CT ANGIOGRAPHY CHEST WITH CONTRAST
TECHNIQUE: Multidetector CT imaging of the chest was performed using the
standard protocol during bolus administration of intravenous
contrast. Multiplanar CT image reconstructions and MIPs were
obtained to evaluate the vascular anatomy.
CONTRAST:  100mL OMNIPAQUE IOHEXOL 350 MG/ML SOLN

[Series 4: pe 3.0 b40f · axial · 0.59mm/px · z∈[-238,-52]mm · 5 of 94 slices shown]
[im 16/94  lung]
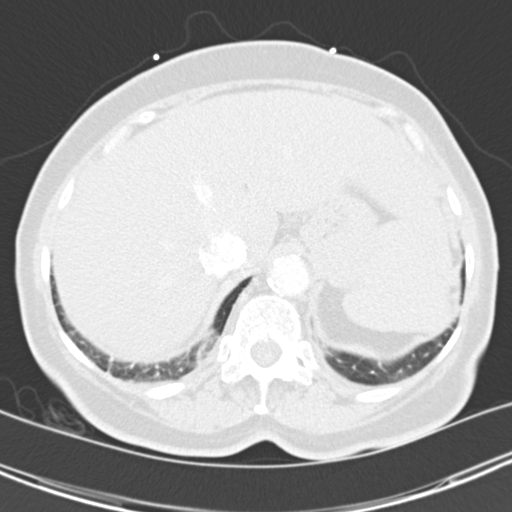
[im 32/94  mediastinal]
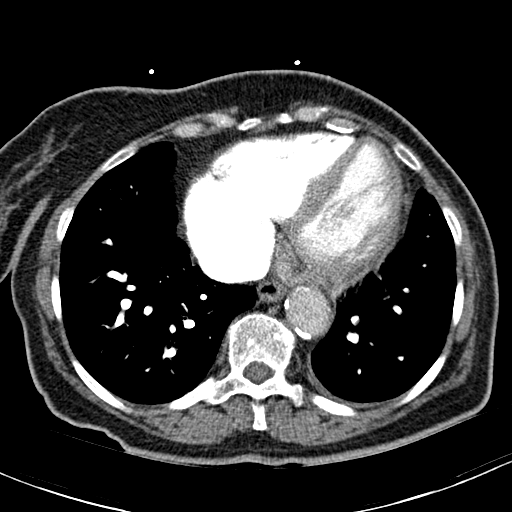
[im 47/94  lung]
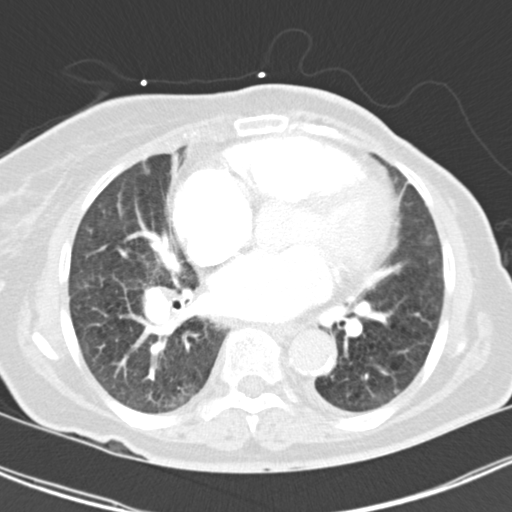
[im 63/94  mediastinal]
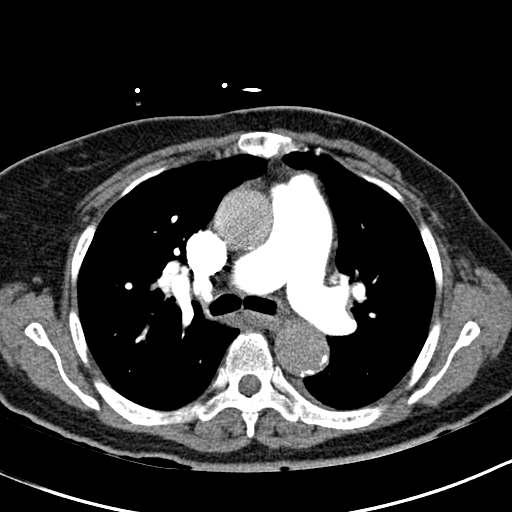
[im 78/94  lung]
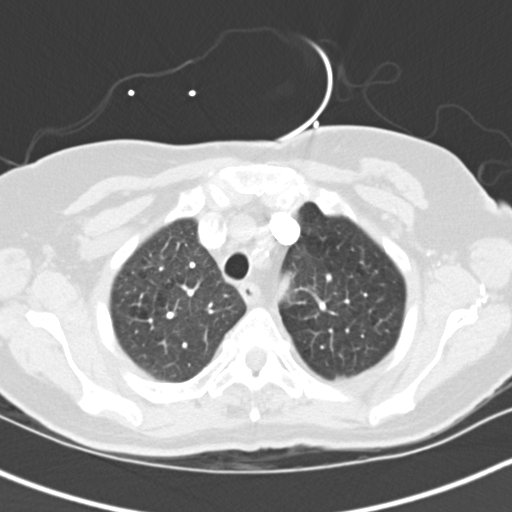

[Series 6: mpr coronal pe 3mm · coronal · 0.57mm/px · 1 of 80 slices shown]
[im 40/80  mediastinal]
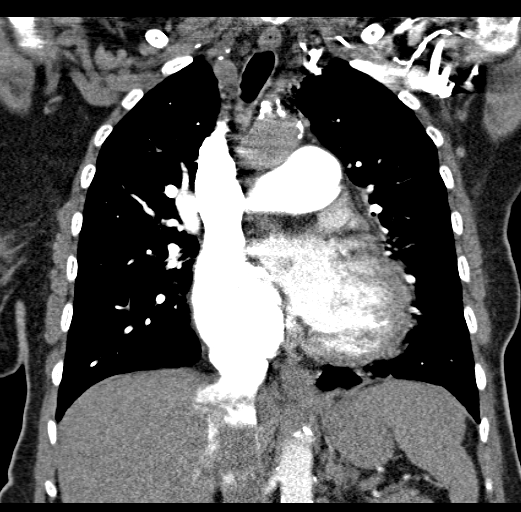

[6 of 36 positions shown; findings below may reference images not displayed]

FINDINGS: Negative for pulmonary embolism. The main pulmonary artery is
enlarged measuring 3.4 cm. Right subcarinal tissue measures 1.4 cm
in the short axis. Ascending thoracic aorta measures 3.5 cm.
Coronary artery calcifications. No significant pericardial or
pleural effusions. No acute abnormality in the upper abdomen.

The trachea and mainstem bronchi are patent. There is centrilobular
emphysema. There is some mild volume loss along the medial right
middle lobe. There is a peripheral nodule in the left lower lobe
with a maximum diameter of 5 mm. Nodule is best seen on sequence 5,
image 65. No large areas of lung consolidation. Previous median
sternotomy. No acute bone abnormality.

Review of the MIP images confirms the above findings.
IMPRESSION: Negative for pulmonary embolism. Enlarged pulmonary arteries are
suggestive for pulmonary hypertension.

Emphysema without focal airspace disease or consolidation.

5 mm nodule in the left lower lobe is indeterminate. If the patient
is at high risk for bronchogenic carcinoma, follow-up chest CT at
6-12 months is recommended. If the patient is at low risk for
bronchogenic carcinoma, follow-up chest CT at 12 months is
recommended. This recommendation follows the consensus statement:
Guidelines for Management of Small Pulmonary Nodules Detected on CT
Scans: A Statement from the [HOSPITAL] as published in

## 2016-08-23 DIAGNOSIS — I4891 Unspecified atrial fibrillation: Secondary | ICD-10-CM | POA: Diagnosis not present

## 2016-09-02 ENCOUNTER — Ambulatory Visit (INDEPENDENT_AMBULATORY_CARE_PROVIDER_SITE_OTHER): Payer: Medicare Other | Admitting: Family Medicine

## 2016-09-02 DIAGNOSIS — D508 Other iron deficiency anemias: Secondary | ICD-10-CM | POA: Diagnosis not present

## 2016-09-02 DIAGNOSIS — E785 Hyperlipidemia, unspecified: Secondary | ICD-10-CM

## 2016-09-02 DIAGNOSIS — I10 Essential (primary) hypertension: Secondary | ICD-10-CM

## 2016-09-02 DIAGNOSIS — Z23 Encounter for immunization: Secondary | ICD-10-CM

## 2016-09-02 DIAGNOSIS — I25119 Atherosclerotic heart disease of native coronary artery with unspecified angina pectoris: Secondary | ICD-10-CM

## 2016-09-02 DIAGNOSIS — J9611 Chronic respiratory failure with hypoxia: Secondary | ICD-10-CM | POA: Diagnosis not present

## 2016-09-02 DIAGNOSIS — R350 Frequency of micturition: Secondary | ICD-10-CM | POA: Diagnosis not present

## 2016-09-02 LAB — URINALYSIS, ROUTINE W REFLEX MICROSCOPIC
BILIRUBIN URINE: NEGATIVE
Ketones, ur: NEGATIVE
Leukocytes, UA: NEGATIVE
NITRITE: NEGATIVE
Specific Gravity, Urine: <=1.005 — AB (ref 1.000–1.030)
TOTAL PROTEIN, URINE-UPE24: NEGATIVE
Urine Glucose: NEGATIVE
Urobilinogen, UA: 0.2 (ref 0.0–1.0)
WBC UA: NONE SEEN (ref 0–?)
pH: 7 (ref 5.0–8.0)

## 2016-09-02 LAB — CBC
HCT: 33 % — ABNORMAL LOW (ref 36.0–46.0)
HEMOGLOBIN: 10.4 g/dL — AB (ref 12.0–15.0)
MCHC: 31.4 g/dL (ref 30.0–36.0)
MCV: 65.2 fl — ABNORMAL LOW (ref 78.0–100.0)
PLATELETS: 437 10*3/uL — AB (ref 150.0–400.0)
RBC: 5.06 Mil/uL (ref 3.87–5.11)
RDW: 22.4 % — ABNORMAL HIGH (ref 11.5–15.5)
WBC: 6.3 10*3/uL (ref 4.0–10.5)

## 2016-09-02 LAB — BASIC METABOLIC PANEL
BUN: 8 mg/dL (ref 6–23)
CHLORIDE: 106 meq/L (ref 96–112)
CO2: 26 mEq/L (ref 19–32)
CREATININE: 0.62 mg/dL (ref 0.40–1.20)
Calcium: 9.9 mg/dL (ref 8.4–10.5)
GFR: 119.23 mL/min (ref >60.00)
GLUCOSE: 80 mg/dL (ref 70–99)
POTASSIUM: 3.3 meq/L — AB (ref 3.5–5.1)
Sodium: 140 mEq/L (ref 135–145)

## 2016-09-02 MED ORDER — CYANOCOBALAMIN 1000 MCG/ML IJ SOLN
1000.0000 ug | Freq: Once | INTRAMUSCULAR | Status: AC
  Administered 2016-09-02: 1000 ug via INTRAMUSCULAR

## 2016-09-02 NOTE — Progress Notes (Signed)
Pre visit review using our clinic review tool, if applicable. No additional management support is needed unless otherwise documented below in the visit note. 

## 2016-09-02 NOTE — Patient Instructions (Addendum)
Yesenia Woods, And at [facility] 09/16/16. [place], [place] [phone]  Anemia, Nonspecific Anemia is a condition in which the concentration of red blood cells or hemoglobin in the blood is below normal. Hemoglobin is a substance in red blood cells that carries oxygen to the tissues of the body. Anemia results in not enough oxygen reaching these tissues.  CAUSES  Common causes of anemia include:   Excessive bleeding. Bleeding may be internal or external. This includes excessive bleeding from periods (in women) or from the intestine.   Poor nutrition.   Chronic kidney, thyroid, and liver disease.  Bone marrow disorders that decrease red blood cell production.  Cancer and treatments for cancer.  HIV, AIDS, and their treatments.  Spleen problems that increase red blood cell destruction.  Blood disorders.  Excess destruction of red blood cells due to infection, medicines, and autoimmune disorders. SIGNS AND SYMPTOMS   Minor weakness.   Dizziness.   Headache.  Palpitations.   Shortness of breath, especially with exercise.   Paleness.  Cold sensitivity.  Indigestion.  Nausea.  Difficulty sleeping.  Difficulty concentrating. Symptoms may occur suddenly or they may develop slowly.  DIAGNOSIS  Additional blood tests are often needed. These help your health care provider determine the best treatment. Your health care provider will check your stool for blood and look for other causes of blood loss.  TREATMENT  Treatment varies depending on the cause of the anemia. Treatment can include:   Supplements of iron, vitamin 123456, or folic acid.   Hormone medicines.   A blood transfusion. This may be needed if blood loss is severe.   Hospitalization. This may be needed if there is significant continual blood loss.   Dietary changes.  Spleen removal. HOME CARE INSTRUCTIONS Keep all follow-up appointments. It often takes many weeks to correct anemia, and  having your health care provider check on your condition and your response to treatment is very important. SEEK IMMEDIATE MEDICAL CARE IF:   You develop extreme weakness, shortness of breath, or chest pain.   You become dizzy or have trouble concentrating.  You develop heavy vaginal bleeding.   You develop a rash.   You have bloody or black, tarry stools.   You faint.   You vomit up blood.   You vomit repeatedly.   You have abdominal pain.  You have a fever or persistent symptoms for more than 2-3 days.   You have a fever and your symptoms suddenly get worse.   You are dehydrated.  MAKE SURE YOU:  Understand these instructions.  Will watch your condition.  Will get help right away if you are not doing well or get worse.   This information is not intended to replace advice given to you by your health care provider. Make sure you discuss any questions you have with your health care provider.   Document Released: 12/09/2004 Document Revised: 07/04/2013 Document Reviewed: 04/27/2013 Elsevier Interactive Patient Education [employer].

## 2016-09-02 NOTE — Progress Notes (Signed)
Patient ID: Yesenia Woods, female   DOB: 1935/02/10, 81 y.o.   MRN: [id number]   Subjective:    Patient ID: Yesenia Woods, female    DOB: 10/18/1935, 81 y.o.   MRN: [id number]  Chief Complaint  Patient presents with  . Hypertension    follow up    Hypertension  Associated symptoms include malaise/fatigue and shortness of breath. Pertinent negatives include no blurred vision, chest pain, headaches or palpitations.  Patient scheduled to go to [facility] for mitral valve leakage. Patient states that she is confused. Provider provided education on process of her procedure. Blood work good. Patient report no blood in stool. Patient will continue to follow up 2-3 month. Patient is in today for follow up on numerous medical concerns. She has been referred by her cardiologist in [place] to [facility] for consideration of worsening heart disease. Notes persistence peripheral edema worse when on feet. No other new or acute concerns. No c/o Cp/palp  Past Medical History:  Diagnosis Date  . Anemia   . Arthritis   . Atrial fibrillation ([place])   . B12 deficiency 04/10/2015  . Breast cancer ([place]) 12/09/2014   Mastectomy on left, chemo x 3 doses  . CAD (coronary artery disease)    Status post LIMA to LAD at [facility] 04/1989  . Chronic respiratory failure with hypoxia ([place])   . COPD (chronic obstructive pulmonary disease) ([place])   . Diverticulitis   . Duodenal ulcer   . Essential hypertension   . Falls 01/18/2016  . Headache(784.0)   . Hyperlipidemia   . Oxygen dependent   . Small bowel arteriovenous malformation     Past Surgical History:  Procedure Laterality Date  . COLONOSCOPY  07/22/2011   Procedure: COLONOSCOPY;  Surgeon: Rogene Houston, MD;  Location: [facility];  Service: Endoscopy;  Laterality: N/A;  . CORONARY ARTERY BYPASS GRAFT  1990   [facility] - LIMA to LAD  . ESOPHAGOGASTRODUODENOSCOPY  07/22/2011   Procedure: ESOPHAGOGASTRODUODENOSCOPY (EGD);  Surgeon: Rogene Houston, MD;  Location: [facility];   Service: Endoscopy;  Laterality: N/A;  . ESOPHAGOGASTRODUODENOSCOPY N/A 04/23/2013   Procedure: ESOPHAGOGASTRODUODENOSCOPY (EGD);  Surgeon: Rogene Houston, MD;  Location[facility];  Service: Endoscopy;  Laterality: N/A;  . EYE SURGERY    . GIVENS CAPSULE STUDY  07/30/2011   Procedure: GIVENS CAPSULE STUDY;  Surgeon: Rogene Houston, MD;  Location: [facility];  Service: Endoscopy;  Laterality: N/A;  7:30  . Hand surgery Right   . MASTECTOMY Left     Family History  Problem Relation Age of Onset  . Prostate cancer Brother   . Prostate cancer Brother   . Breast cancer Daughter     Breast Cancer that mets  . Throat cancer Daughter   . Healthy Daughter   . Healthy Son   . Diabetes Son   . Hypertension Son   . Healthy Son   . Healthy Son   . Healthy Son   . Heart disease Son     Cardiac arrythmia, with defibrillator  . Healthy Son   . Prostate cancer Son   . CAD Son     Social History   Social History  . Marital status: Widowed    Spouse name: N/A  . Number of children: N/A  . Years of education: N/A   Occupational History  . Not on file.   Social History Main Topics  . Smoking status: Former Smoker    Packs/day: 1.00  Years: 30.00    Types: Cigarettes    Quit date: 10/14/1994  . Smokeless tobacco: Never Used     Comment: Patient quit smoking 25 years ago  . Alcohol use No     Comment: none for 8 months, wine occasionally  . Drug use: No  . 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    Other Topics Concern  . Not on file   Social History Narrative  . No narrative on file    Outpatient Medications Prior to Visit  Medication Sig Dispense Refill  . acetaminophen (TYLENOL) 500 MG tablet Take 1,000 mg by mouth daily as needed for moderate pain.    Marland Kitchen amLODipine (NORVASC) 5 MG tablet Take 1 tablet (5 mg total) by mouth daily. 30 tablet 1  . aspirin EC 81 MG tablet Take 81 mg by mouth daily.      Marland Kitchen BREO ELLIPTA 100-25 MCG/INH AEPB Inhale 1 puff into the lungs daily.     . ferrous sulfate 325 (65 FE) MG tablet Take 1 tablet (325 mg total) by mouth 2 (two) times daily with a meal. (Patient taking differently: Take 325 mg by mouth daily with breakfast. ) 30 tablet 3  . furosemide (LASIX) 40 MG tablet Take 1 tablet (40 mg total) by mouth daily as needed for fluid or edema. 30 tablet 0  . hydroxypropyl methylcellulose / hypromellose (ISOPTO TEARS / GONIOVISC) 2.5 % ophthalmic solution Place 1 drop into both eyes daily as needed for dry eyes.    Marland Kitchen ipratropium-albuterol (DUONEB) 0.5-2.5 (3) MG/3ML SOLN Take 3 mLs by nebulization every 6 (six) hours as needed (shortness of breath).     . isosorbide mononitrate (IMDUR) 30 MG 24 hr tablet Take 30 mg by mouth 2 (two) times daily.     . nitroGLYCERIN (NITROSTAT) 0.4 MG SL tablet Place 1 tablet (0.4 mg total) under the tongue every 5 (five) minutes as needed for chest pain. If no relief after 3 doses to ER 25 tablet 3  . omeprazole (PRILOSEC) 20 MG capsule Take 1 capsule by mouth daily.    Marland Kitchen PROAIR HFA 108 (90 BASE) MCG/ACT inhaler Inhale 2 puffs into the lungs every 6 (six) hours as needed for wheezing or shortness of breath.     . warfarin (COUMADIN) 4 MG tablet Take 1 tablet (4 mg total) by mouth daily. Take as directed. Take 4 mg on Monday and 7.5 mg Tuesday-Sunday. (Patient taking differently: Take 4 mg by mouth daily. Patient takes 7.5mg  on Monday and 4mg  all other days)    . warfarin (COUMADIN) 7.5 MG tablet Take 7.5 mg by mouth daily. Patient take 7.5mg  on Monday only and 4mg  all other days     No facility-administered medications prior to visit.     No Known Allergies  Review of Systems  Constitutional: Positive for malaise/fatigue. Negative for fever.  Eyes: Negative for blurred vision.  Respiratory: Positive for shortness of breath. Negative for cough.   Cardiovascular: Negative for chest pain and palpitations.  Gastrointestinal:  Negative for vomiting.  Musculoskeletal: Negative for back pain.  Skin: Negative for rash.  Neurological: Positive for weakness. Negative for loss of consciousness and headaches.       Objective:    Physical Exam  Constitutional: She is oriented to person, place, and time. She appears well-developed and well-nourished. No distress.  HENT:  Head: Normocephalic and atraumatic.  Nose: Nose normal.  Eyes: Right eye exhibits  no discharge. Left eye exhibits no discharge.  Neck: Normal range of motion. Neck supple.  Cardiovascular: Normal rate.   Pulmonary/Chest: Effort normal and breath sounds normal.  Abdominal: Soft. Bowel sounds are normal. There is no tenderness.  Musculoskeletal: She exhibits no edema.  Neurological: She is alert and oriented to person, place, and time.  Skin: Skin is warm and dry.  Psychiatric: She has a normal mood and affect.  Nursing note and vitals reviewed.   BP (!) (P) 177/67 (BP Location: Right Arm, Patient Position: Sitting, Cuff Size: Normal)   Pulse (!) (P) 110   Resp (P) 18   Ht (P) 5' (1.524 m)   Wt (P) 138 lb 6.4 oz (62.8 kg)   SpO2 (P) 100%   PF (!) (P) 2 L/min   BMI (P) 27.03 kg/m  Wt Readings from Last 3 Encounters:  09/02/16 (P) 138 lb 6.4 oz (62.8 kg)  07/05/16 132 lb (59.9 kg)  04/27/16 131 lb 2 oz (59.5 kg)     Lab Results  Component Value Date   WBC 6.3 09/02/2016   HGB 10.4 (L) 09/02/2016   HCT 33.0 (L) 09/02/2016   PLT 437.0 (H) 09/02/2016   GLUCOSE 80 09/02/2016   CHOL 135 04/27/2016   TRIG 51.0 04/27/2016   HDL 61.60 04/27/2016   LDLCALC 63 04/27/2016   ALT 10 07/05/2016   AST 14 07/05/2016   NA 140 09/02/2016   K 3.3 (L) 09/02/2016   CL 106 09/02/2016   CREATININE 0.62 09/02/2016   BUN 8 09/02/2016   CO2 26 09/02/2016   TSH 1.36 04/27/2016   INR 2.41 (H) 06/04/2015   HGBA1C 6.2 (H) 07/19/2011    Lab Results  Component Value Date   TSH 1.36 04/27/2016   Lab Results  Component Value Date   WBC 6.3  09/02/2016   HGB 10.4 (L) 09/02/2016   HCT 33.0 (L) 09/02/2016   MCV 65.2 Repeated and verified X2. (L) 09/02/2016   PLT 437.0 (H) 09/02/2016   Lab Results  Component Value Date   NA 140 09/02/2016   K 3.3 (L) 09/02/2016   CO2 26 09/02/2016   GLUCOSE 80 09/02/2016   BUN 8 09/02/2016   CREATININE 0.62 09/02/2016   BILITOT 0.5 07/05/2016   ALKPHOS 114 07/05/2016   AST 14 07/05/2016   ALT 10 07/05/2016   PROT 7.5 07/05/2016   ALBUMIN 4.1 07/05/2016   CALCIUM 9.9 09/02/2016   ANIONGAP 8 03/10/2016   GFR 119.23 09/02/2016   Lab Results  Component Value Date   CHOL 135 04/27/2016   Lab Results  Component Value Date   HDL 61.60 04/27/2016   Lab Results  Component Value Date   LDLCALC 63 04/27/2016   Lab Results  Component Value Date   TRIG 51.0 04/27/2016   Lab Results  Component Value Date   CHOLHDL 2 04/27/2016   Lab Results  Component Value Date   HGBA1C 6.2 (H) 07/19/2011       Assessment & Plan:   Problem List Items Addressed This Visit    CAD (coronary artery disease) (Chronic)    Referred to [facility] by her cardiologist due worsening valvular disease. Has an appt this coming week      HTN (hypertension) - Primary (Chronic)    Well controlled, no changes to meds. Encouraged heart healthy diet such as the DASH diet and exercise as tolerated.       Relevant Orders   Basic Metabolic Panel (BMET) (Completed)   Anemia  Relevant Medications   cyanocobalamin ((VITAMIN B-12)) injection 1,000 mcg (Completed)   Other Relevant Orders   CBC (Completed)   Hyperlipidemia    Encouraged heart healthy diet, increase exercise, avoid trans fats, consider a krill oil cap daily      Chronic respiratory failure with hypoxia ([place])    Has been doing well on supplemental oxygen       Other Visit Diagnoses    Urinary frequency       Relevant Orders   Urine Culture   Urinalysis, Routine w reflex microscopic (Completed)   Encounter for immunization       Relevant  Orders   Flu vaccine HIGH DOSE PF (Completed)      I am having Ms. Bench start on potassium chloride. I am also having her maintain her BREO ELLIPTA, PROAIR HFA, isosorbide mononitrate, acetaminophen, furosemide, ipratropium-albuterol, omeprazole, nitroGLYCERIN, aspirin EC, warfarin, hydroxypropyl methylcellulose / hypromellose, amLODipine, ferrous sulfate, and warfarin. We administered cyanocobalamin.  Meds ordered this encounter  Medications  . cyanocobalamin ((VITAMIN B-12)) injection 1,000 mcg  . potassium chloride (K-DUR) 10 MEQ tablet    Sig: Take 1 tablet (10 mEq total) by mouth daily.    Dispense:  30 tablet    Refill:  0     Penni Homans, MD

## 2016-09-03 LAB — URINE CULTURE

## 2016-09-03 MED ORDER — POTASSIUM CHLORIDE ER 10 MEQ PO TBCR: 10.0000 meq | EXTENDED_RELEASE_TABLET | Freq: Every day | ORAL | 0 refills | Status: AC

## 2016-09-03 NOTE — Assessment & Plan Note (Signed)
Referred to [facility] by her cardiologist due worsening valvular disease. Has an appt this coming week

## 2016-09-03 NOTE — Assessment & Plan Note (Signed)
Well controlled, no changes to meds. Encouraged heart healthy diet such as the DASH diet and exercise as tolerated.  °

## 2016-09-03 NOTE — Assessment & Plan Note (Signed)
Has been doing well on supplemental oxygen

## 2016-09-03 NOTE — Assessment & Plan Note (Signed)
Encouraged heart healthy diet, increase exercise, avoid trans fats, consider a krill oil cap daily 

## 2016-09-03 NOTE — Assessment & Plan Note (Signed)
Stable, continue current meds, will continue to monitor the CBC

## 2016-09-06 DIAGNOSIS — L03114 Cellulitis of left upper limb: Secondary | ICD-10-CM | POA: Diagnosis not present

## 2016-09-06 DIAGNOSIS — L0390 Cellulitis, unspecified: Secondary | ICD-10-CM | POA: Diagnosis not present

## 2016-09-07 DIAGNOSIS — Z79899 Other long term (current) drug therapy: Secondary | ICD-10-CM | POA: Diagnosis not present

## 2016-09-07 DIAGNOSIS — Z792 Long term (current) use of antibiotics: Secondary | ICD-10-CM | POA: Diagnosis not present

## 2016-09-07 DIAGNOSIS — Z859 Personal history of malignant neoplasm, unspecified: Secondary | ICD-10-CM | POA: Diagnosis not present

## 2016-09-07 DIAGNOSIS — I10 Essential (primary) hypertension: Secondary | ICD-10-CM | POA: Diagnosis not present

## 2016-09-07 DIAGNOSIS — R609 Edema, unspecified: Secondary | ICD-10-CM | POA: Diagnosis not present

## 2016-09-07 DIAGNOSIS — M79602 Pain in left arm: Secondary | ICD-10-CM | POA: Diagnosis not present

## 2016-09-07 DIAGNOSIS — L03114 Cellulitis of left upper limb: Secondary | ICD-10-CM | POA: Diagnosis not present

## 2016-09-07 DIAGNOSIS — Z7901 Long term (current) use of anticoagulants: Secondary | ICD-10-CM | POA: Diagnosis not present

## 2016-09-16 DIAGNOSIS — I10 Essential (primary) hypertension: Secondary | ICD-10-CM | POA: Diagnosis not present

## 2016-09-16 DIAGNOSIS — I4891 Unspecified atrial fibrillation: Secondary | ICD-10-CM | POA: Diagnosis not present

## 2016-09-16 DIAGNOSIS — I340 Nonrheumatic mitral (valve) insufficiency: Secondary | ICD-10-CM | POA: Diagnosis not present

## 2016-09-16 DIAGNOSIS — I361 Nonrheumatic tricuspid (valve) insufficiency: Secondary | ICD-10-CM | POA: Diagnosis not present

## 2016-09-16 DIAGNOSIS — R9431 Abnormal electrocardiogram [ECG] [EKG]: Secondary | ICD-10-CM | POA: Diagnosis not present

## 2016-09-16 DIAGNOSIS — R0609 Other forms of dyspnea: Secondary | ICD-10-CM | POA: Diagnosis not present

## 2016-09-16 DIAGNOSIS — I371 Nonrheumatic pulmonary valve insufficiency: Secondary | ICD-10-CM | POA: Diagnosis not present

## 2016-09-16 DIAGNOSIS — I517 Cardiomegaly: Secondary | ICD-10-CM | POA: Diagnosis not present

## 2016-09-28 DIAGNOSIS — I340 Nonrheumatic mitral (valve) insufficiency: Secondary | ICD-10-CM | POA: Diagnosis not present

## 2016-09-28 DIAGNOSIS — I341 Nonrheumatic mitral (valve) prolapse: Secondary | ICD-10-CM | POA: Diagnosis not present

## 2016-09-28 DIAGNOSIS — R0609 Other forms of dyspnea: Secondary | ICD-10-CM | POA: Diagnosis not present

## 2016-09-30 ENCOUNTER — Ambulatory Visit (INDEPENDENT_AMBULATORY_CARE_PROVIDER_SITE_OTHER): Payer: Medicare Other | Admitting: Behavioral Health

## 2016-09-30 DIAGNOSIS — E538 Deficiency of other specified B group vitamins: Secondary | ICD-10-CM | POA: Diagnosis not present

## 2016-09-30 MED ORDER — CYANOCOBALAMIN 1000 MCG/ML IJ SOLN
1000.0000 ug | Freq: Once | INTRAMUSCULAR | Status: AC
  Administered 2016-09-30: 1000 ug via INTRAMUSCULAR

## 2016-09-30 NOTE — Progress Notes (Signed)
Pre visit review using our clinic review tool, if applicable. No additional management support is needed unless otherwise documented below in the visit note.  Patient in clinic today for B12 injection. IM given in Right Deltoid. Patient tolerated injection well.  Next appointment scheduled for 11/02/16 at 9:00 AM.

## 2016-10-05 DIAGNOSIS — E785 Hyperlipidemia, unspecified: Secondary | ICD-10-CM | POA: Diagnosis not present

## 2016-10-05 DIAGNOSIS — I10 Essential (primary) hypertension: Secondary | ICD-10-CM | POA: Diagnosis not present

## 2016-10-05 DIAGNOSIS — I059 Rheumatic mitral valve disease, unspecified: Secondary | ICD-10-CM | POA: Diagnosis not present

## 2016-10-05 DIAGNOSIS — I480 Paroxysmal atrial fibrillation: Secondary | ICD-10-CM | POA: Diagnosis not present

## 2016-10-05 DIAGNOSIS — I2510 Atherosclerotic heart disease of native coronary artery without angina pectoris: Secondary | ICD-10-CM | POA: Diagnosis not present

## 2016-10-05 DIAGNOSIS — I369 Nonrheumatic tricuspid valve disorder, unspecified: Secondary | ICD-10-CM | POA: Diagnosis not present

## 2016-10-28 DIAGNOSIS — I059 Rheumatic mitral valve disease, unspecified: Secondary | ICD-10-CM | POA: Diagnosis not present

## 2016-10-28 DIAGNOSIS — I10 Essential (primary) hypertension: Secondary | ICD-10-CM | POA: Diagnosis not present

## 2016-10-28 DIAGNOSIS — I2510 Atherosclerotic heart disease of native coronary artery without angina pectoris: Secondary | ICD-10-CM | POA: Diagnosis not present

## 2016-10-28 DIAGNOSIS — I369 Nonrheumatic tricuspid valve disorder, unspecified: Secondary | ICD-10-CM | POA: Diagnosis not present

## 2016-10-28 DIAGNOSIS — E785 Hyperlipidemia, unspecified: Secondary | ICD-10-CM | POA: Diagnosis not present

## 2016-10-28 DIAGNOSIS — I480 Paroxysmal atrial fibrillation: Secondary | ICD-10-CM | POA: Diagnosis not present

## 2016-11-02 ENCOUNTER — Ambulatory Visit (INDEPENDENT_AMBULATORY_CARE_PROVIDER_SITE_OTHER): Payer: Medicare Other | Admitting: Behavioral Health

## 2016-11-02 DIAGNOSIS — E538 Deficiency of other specified B group vitamins: Secondary | ICD-10-CM

## 2016-11-02 MED ORDER — CYANOCOBALAMIN 1000 MCG/ML IJ SOLN
1000.0000 ug | Freq: Once | INTRAMUSCULAR | Status: AC
  Administered 2016-11-02: 1000 ug via INTRAMUSCULAR

## 2016-11-02 NOTE — Progress Notes (Signed)
RN blood check note reviewed. Agree with documention and plan. 

## 2016-11-02 NOTE — Progress Notes (Signed)
Pre visit review using our clinic review tool, if applicable. No additional management support is needed unless otherwise documented below in the visit note.  Patient came in clinic today for B12 injection. IM given in Right Deltoid. Patient tolerated injection well.  Next appointment scheduled for 12/13/16 at 9:00 AM.

## 2016-11-24 DIAGNOSIS — E785 Hyperlipidemia, unspecified: Secondary | ICD-10-CM | POA: Diagnosis not present

## 2016-11-24 DIAGNOSIS — I480 Paroxysmal atrial fibrillation: Secondary | ICD-10-CM | POA: Diagnosis not present

## 2016-11-24 DIAGNOSIS — I059 Rheumatic mitral valve disease, unspecified: Secondary | ICD-10-CM | POA: Diagnosis not present

## 2016-11-24 DIAGNOSIS — I369 Nonrheumatic tricuspid valve disorder, unspecified: Secondary | ICD-10-CM | POA: Diagnosis not present

## 2016-11-24 DIAGNOSIS — I10 Essential (primary) hypertension: Secondary | ICD-10-CM | POA: Diagnosis not present

## 2016-11-24 DIAGNOSIS — I2510 Atherosclerotic heart disease of native coronary artery without angina pectoris: Secondary | ICD-10-CM | POA: Diagnosis not present

## 2016-12-03 ENCOUNTER — Ambulatory Visit: Payer: Medicare Other | Admitting: Family Medicine

## 2016-12-03 ENCOUNTER — Ambulatory Visit: Payer: Medicare Other

## 2016-12-05 DIAGNOSIS — R05 Cough: Secondary | ICD-10-CM | POA: Diagnosis not present

## 2016-12-13 ENCOUNTER — Encounter: Payer: Self-pay | Admitting: Family Medicine

## 2016-12-13 ENCOUNTER — Ambulatory Visit (INDEPENDENT_AMBULATORY_CARE_PROVIDER_SITE_OTHER): Payer: Medicare Other | Admitting: Family Medicine

## 2016-12-13 VITALS — BP 100/68 | HR 66 | Temp 98.6°F | Wt 129.6 lb

## 2016-12-13 DIAGNOSIS — M79642 Pain in left hand: Secondary | ICD-10-CM

## 2016-12-13 DIAGNOSIS — I25119 Atherosclerotic heart disease of native coronary artery with unspecified angina pectoris: Secondary | ICD-10-CM

## 2016-12-13 DIAGNOSIS — D500 Iron deficiency anemia secondary to blood loss (chronic): Secondary | ICD-10-CM

## 2016-12-13 DIAGNOSIS — E538 Deficiency of other specified B group vitamins: Secondary | ICD-10-CM | POA: Diagnosis not present

## 2016-12-13 DIAGNOSIS — I482 Chronic atrial fibrillation, unspecified: Secondary | ICD-10-CM

## 2016-12-13 DIAGNOSIS — R001 Bradycardia, unspecified: Secondary | ICD-10-CM

## 2016-12-13 DIAGNOSIS — I10 Essential (primary) hypertension: Secondary | ICD-10-CM | POA: Diagnosis not present

## 2016-12-13 DIAGNOSIS — E785 Hyperlipidemia, unspecified: Secondary | ICD-10-CM

## 2016-12-13 HISTORY — DX: Pain in left hand: M79.642

## 2016-12-13 LAB — CBC
HEMATOCRIT: 36.3 % (ref 36.0–46.0)
HEMOGLOBIN: 11.4 g/dL — AB (ref 12.0–15.0)
MCHC: 31.3 g/dL (ref 30.0–36.0)
PLATELETS: 341 10*3/uL (ref 150.0–400.0)
RBC: 5.59 Mil/uL — ABNORMAL HIGH (ref 3.87–5.11)
RDW: 24.9 % — ABNORMAL HIGH (ref 11.5–15.5)
WBC: 6.2 10*3/uL (ref 4.0–10.5)

## 2016-12-13 LAB — LIPID PANEL
CHOLESTEROL: 148 mg/dL (ref 0–200)
HDL: 58.9 mg/dL (ref >39.00)
LDL CALC: 73 mg/dL (ref 0–99)
NonHDL: 88.99
TRIGLYCERIDES: 80 mg/dL (ref 0.0–149.0)
Total CHOL/HDL Ratio: 3
VLDL: 16 mg/dL (ref 0.0–40.0)

## 2016-12-13 LAB — COMPREHENSIVE METABOLIC PANEL
ALT: 8 U/L (ref 0–35)
AST: 13 U/L (ref 0–37)
Albumin: 3.7 g/dL (ref 3.5–5.2)
Alkaline Phosphatase: 85 U/L (ref 39–117)
BILIRUBIN TOTAL: 0.7 mg/dL (ref 0.2–1.2)
BUN: 7 mg/dL (ref 6–23)
CALCIUM: 9.8 mg/dL (ref 8.4–10.5)
CHLORIDE: 105 meq/L (ref 96–112)
CO2: 26 meq/L (ref 19–32)
Creatinine, Ser: 0.66 mg/dL (ref 0.40–1.20)
GFR: 110.85 mL/min (ref >60.00)
Glucose, Bld: 91 mg/dL (ref 70–99)
POTASSIUM: 3.5 meq/L (ref 3.5–5.1)
Sodium: 140 mEq/L (ref 135–145)
Total Protein: 6.6 g/dL (ref 6.0–8.3)

## 2016-12-13 LAB — VITAMIN B12: Vitamin B-12: >1500 pg/mL — ABNORMAL HIGH (ref 211–911)

## 2016-12-13 LAB — TSH: TSH: 1.65 u[IU]/mL (ref 0.35–4.50)

## 2016-12-13 LAB — URIC ACID: Uric Acid, Serum: 4.3 mg/dL (ref 2.4–7.0)

## 2016-12-13 MED ORDER — CYANOCOBALAMIN 1000 MCG/ML IJ SOLN
1000.0000 ug | Freq: Once | INTRAMUSCULAR | Status: AC
  Administered 2016-12-13: 1000 ug via INTRAMUSCULAR

## 2016-12-13 NOTE — Progress Notes (Signed)
Patient ID: Yesenia Woods, female   DOB: 04/27/1935, 82 y.o.   MRN: [id number]

## 2016-12-13 NOTE — Progress Notes (Signed)
Pre visit review using our clinic review tool, if applicable. No additional management support is needed unless otherwise documented below in the visit note. 

## 2016-12-13 NOTE — Assessment & Plan Note (Signed)
Check uric acid level today. She had an episode recently of sudden onset red, swollen painful left hand was seen at an urgent care and they questioned if she had cellulitis, treated as such and improved. Will check labs today

## 2016-12-13 NOTE — Patient Instructions (Signed)
Anemia, Nonspecific Anemia is a condition in which the concentration of red blood cells or hemoglobin in the blood is below normal. Hemoglobin is a substance in red blood cells that carries oxygen to the tissues of the body. Anemia results in not enough oxygen reaching these tissues. What are the causes? Common causes of anemia include:  Excessive bleeding. Bleeding may be internal or external. This includes excessive bleeding from periods (in women) or from the intestine.  Poor nutrition.  Chronic kidney, thyroid, and liver disease.  Bone marrow disorders that decrease red blood cell production.  Cancer and treatments for cancer.  HIV, AIDS, and their treatments.  Spleen problems that increase red blood cell destruction.  Blood disorders.  Excess destruction of red blood cells due to infection, medicines, and autoimmune disorders. What are the signs or symptoms?  Minor weakness.  Dizziness.  Headache.  Palpitations.  Shortness of breath, especially with exercise.  Paleness.  Cold sensitivity.  Indigestion.  Nausea.  Difficulty sleeping.  Difficulty concentrating. Symptoms may occur suddenly or they may develop slowly. How is this diagnosed? Additional blood tests are often needed. These help your health care provider determine the best treatment. Your health care provider will check your stool for blood and look for other causes of blood loss. How is this treated? Treatment varies depending on the cause of the anemia. Treatment can include:  Supplements of iron, vitamin B12, or folic acid.  Hormone medicines.  A blood transfusion. This may be needed if blood loss is severe.  Hospitalization. This may be needed if there is significant continual blood loss.  Dietary changes.  Spleen removal. Follow these instructions at home: Keep all follow-up appointments. It often takes many weeks to correct anemia, and having your health care provider check on your  condition and your response to treatment is very important. Get help right away if:  You develop extreme weakness, shortness of breath, or chest pain.  You become dizzy or have trouble concentrating.  You develop heavy vaginal bleeding.  You develop a rash.  You have bloody or black, tarry stools.  You faint.  You vomit up blood.  You vomit repeatedly.  You have abdominal pain.  You have a fever or persistent symptoms for more than 2-3 days.  You have a fever and your symptoms suddenly get worse.  You are dehydrated. This information is not intended to replace advice given to you by your health care provider. Make sure you discuss any questions you have with your health care provider. Document Released: 12/09/2004 Document Revised: 04/14/2016 Document Reviewed: 04/27/2013 Elsevier Interactive Patient Education  2017 [employer]  

## 2016-12-13 NOTE — Assessment & Plan Note (Signed)
Tolerating Coumadin, following with cardiology for INR checks

## 2016-12-13 NOTE — Progress Notes (Signed)
Subjective:    Patient ID: Yesenia Woods, female    DOB: August 31, 1935, 82 y.o.   MRN: [id number]  Chief Complaint  Patient presents with  . Follow-up    HTN.    HPI Patient is in today for a 61-month follow up on hypertension. Patient is also to receive her B-12 injection today. Patient is also following up on CAD, A-fib, COPD, GERD, anemia.  No new acute concerns or hospitaliazations today. Did have a cellulitis on her right arm but this has resolved. Denies CP/palp/SOB/HA/congestion/fevers/GI or GU c/o. Taking meds as prescribed  Past Medical History:  Diagnosis Date  . Anemia   . Arthritis   . Atrial fibrillation ([place])   . B12 deficiency 04/10/2015  . Breast cancer ([place]) 12/09/2014   Mastectomy on left, chemo x 3 doses  . CAD (coronary artery disease)    Status post LIMA to LAD at [facility] 04/1989  . Chronic respiratory failure with hypoxia ([place])   . COPD (chronic obstructive pulmonary disease) ([place])   . Diverticulitis   . Duodenal ulcer   . Essential hypertension   . Falls 01/18/2016  . Headache(784.0)   . Hyperlipidemia   . Left hand pain 12/13/2016  . Oxygen dependent   . Small bowel arteriovenous malformation     Past Surgical History:  Procedure Laterality Date  . COLONOSCOPY  07/22/2011   Procedure: COLONOSCOPY;  Surgeon: Rogene Houston, MD;  [facility];  Service: Endoscopy;  Laterality: N/A;  . CORONARY ARTERY BYPASS GRAFT  1990   [facility] - LIMA to LAD  . ESOPHAGOGASTRODUODENOSCOPY  07/22/2011   Procedure: ESOPHAGOGASTRODUODENOSCOPY (EGD);  Surgeon: Rogene Houston, MD;  [facility];  Service: Endoscopy;  Laterality: N/A;  . ESOPHAGOGASTRODUODENOSCOPY N/A 04/23/2013   Procedure: ESOPHAGOGASTRODUODENOSCOPY (EGD);  Surgeon: Rogene Houston, MD;  [facility];  Service: Endoscopy;  Laterality: N/A;  . EYE SURGERY    . GIVENS CAPSULE STUDY  07/30/2011   Procedure: GIVENS CAPSULE STUDY;  Surgeon: Rogene Houston, MD;  [facility];   Service: Endoscopy;  Laterality: N/A;  7:30  . Hand surgery Right   . MASTECTOMY Left     Family History  Problem Relation Age of Onset  . Prostate cancer Brother   . Prostate cancer Brother   . Breast cancer Daughter     Breast Cancer that mets  . Throat cancer Daughter   . Healthy Daughter   . Healthy Son   . Diabetes Son   . Hypertension Son   . Healthy Son   . Healthy Son   . Healthy Son   . Heart disease Son     Cardiac arrythmia, with defibrillator  . Healthy Son   . Prostate cancer Son   . CAD Son     Social History   Social History  . Marital status: Widowed    Spouse name: N/A  . Number of children: N/A  . Years of education: N/A   Occupational History  . Not on file.   Social History Main Topics  . Smoking status: Former Smoker    Packs/day: 1.00    Years: 30.00    Types: Cigarettes    Quit date: 10/14/1994  . Smokeless tobacco: Never Used     Comment: Patient quit smoking 25 years ago  . Alcohol use No     Comment: none for 8 months, wine occasionally  . Drug use: No  . 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    Other Topics Concern  . Not on file   Social History Narrative  . No narrative on file    Outpatient Medications Prior to Visit  Medication Sig Dispense Refill  . acetaminophen (TYLENOL) 500 MG tablet Take 1,000 mg by mouth daily as needed for moderate pain.    Marland Kitchen amLODipine (NORVASC) 5 MG tablet Take 1 tablet (5 mg total) by mouth daily. 30 tablet 1  . aspirin EC 81 MG tablet Take 81 mg by mouth daily.    Marland Kitchen BREO ELLIPTA 100-25 MCG/INH AEPB Inhale 1 puff into the lungs daily.     . ferrous sulfate 325 (65 FE) MG tablet Take 1 tablet (325 mg total) by mouth 2 (two) times daily with a meal. (Patient taking differently: Take 325 mg by mouth daily with breakfast. ) 30 tablet 3  . furosemide (LASIX) 40 MG tablet Take 1 tablet (40 mg total) by mouth daily as needed for fluid or  edema. 30 tablet 0  . hydroxypropyl methylcellulose / hypromellose (ISOPTO TEARS / GONIOVISC) 2.5 % ophthalmic solution Place 1 drop into both eyes daily as needed for dry eyes.    Marland Kitchen ipratropium-albuterol (DUONEB) 0.5-2.5 (3) MG/3ML SOLN Take 3 mLs by nebulization every 6 (six) hours as needed (shortness of breath).     . isosorbide mononitrate (IMDUR) 30 MG 24 hr tablet Take 30 mg by mouth 2 (two) times daily.     . nitroGLYCERIN (NITROSTAT) 0.4 MG SL tablet Place 1 tablet (0.4 mg total) under the tongue every 5 (five) minutes as needed for chest pain. If no relief after 3 doses to ER 25 tablet 3  . omeprazole (PRILOSEC) 20 MG capsule Take 1 capsule by mouth daily.    . potassium chloride (K-DUR) 10 MEQ tablet Take 1 tablet (10 mEq total) by mouth daily. 30 tablet 0  . PROAIR HFA 108 (90 BASE) MCG/ACT inhaler Inhale 2 puffs into the lungs every 6 (six) hours as needed for wheezing or shortness of breath.     . warfarin (COUMADIN) 4 MG tablet Take 1 tablet (4 mg total) by mouth daily. Take as directed. Take 4 mg on Monday and 7.5 mg Tuesday-Sunday. (Patient taking differently: Take 4 mg by mouth daily. Patient takes 7.5mg  on Monday and 4mg  all other days)    . warfarin (COUMADIN) 7.5 MG tablet Take 7.5 mg by mouth daily. Patient take 7.5mg  on Monday only and 4mg  all other days     No facility-administered medications prior to visit.     No Known Allergies  Review of Systems  Constitutional: Negative for fever and malaise/fatigue.  HENT: Negative for congestion.   Eyes: Negative for blurred vision.  Respiratory: Negative for cough and shortness of breath.   Cardiovascular: Negative for chest pain, palpitations and leg swelling.  Gastrointestinal: Negative for vomiting.  Musculoskeletal: Negative for back pain.  Skin: Negative for rash.  Neurological: Negative for loss of consciousness and headaches.       Objective:    Physical Exam  Constitutional: She is oriented to person, place,  and time. She appears well-developed and well-nourished. No distress.  HENT:  Head: Normocephalic and atraumatic.  Eyes: Conjunctivae are normal.  Neck: Normal range of motion. No thyromegaly present.  Cardiovascular: Normal rate.   Irregularly irregular.   Pulmonary/Chest: Effort normal and breath sounds normal. She has no wheezes.  Abdominal: Soft. Bowel sounds are normal. There is no tenderness.  Musculoskeletal: She exhibits no edema or deformity.  Neurological: She is alert and oriented to person, place, and time.  Skin: Skin is warm and dry. She is not diaphoretic.  Psychiatric: She has a normal mood and affect.    BP 100/68 (BP Location: Right Arm, Patient Position: Sitting, Cuff Size: Normal)   Pulse 66   Temp 98.6 F (37 C) (Oral)   Wt 129 lb 9.6 oz (58.8 kg)   SpO2 95% Comment: RA  BMI (P) 25.31 kg/m  Wt Readings from Last 3 Encounters:  12/13/16 129 lb 9.6 oz (58.8 kg)  09/02/16 (P) 138 lb 6.4 oz (62.8 kg)  07/05/16 132 lb (59.9 kg)     Lab Results  Component Value Date   WBC 6.2 12/13/2016   HGB 11.4 (L) 12/13/2016   HCT 36.3 12/13/2016   PLT 341.0 12/13/2016   GLUCOSE 91 12/13/2016   CHOL 148 12/13/2016   TRIG 80.0 12/13/2016   HDL 58.90 12/13/2016   LDLCALC 73 12/13/2016   ALT 8 12/13/2016   AST 13 12/13/2016   NA 140 12/13/2016   K 3.5 12/13/2016   CL 105 12/13/2016   CREATININE 0.66 12/13/2016   BUN 7 12/13/2016   CO2 26 12/13/2016   TSH 1.65 12/13/2016   INR 2.41 (H) 06/04/2015   HGBA1C 6.2 (H) 07/19/2011    Lab Results  Component Value Date   TSH 1.65 12/13/2016   Lab Results  Component Value Date   WBC 6.2 12/13/2016   HGB 11.4 (L) 12/13/2016   HCT 36.3 12/13/2016   MCV 65.1 Repeated and verified X2. (L) 12/13/2016   PLT 341.0 12/13/2016   Lab Results  Component Value Date   NA 140 12/13/2016   K 3.5 12/13/2016   CO2 26 12/13/2016   GLUCOSE 91 12/13/2016   BUN 7 12/13/2016   CREATININE 0.66 12/13/2016   BILITOT 0.7  12/13/2016   ALKPHOS 85 12/13/2016   AST 13 12/13/2016   ALT 8 12/13/2016   PROT 6.6 12/13/2016   ALBUMIN 3.7 12/13/2016   CALCIUM 9.8 12/13/2016   ANIONGAP 8 03/10/2016   GFR 110.85 12/13/2016   Lab Results  Component Value Date   CHOL 148 12/13/2016   Lab Results  Component Value Date   HDL 58.90 12/13/2016   Lab Results  Component Value Date   LDLCALC 73 12/13/2016   Lab Results  Component Value Date   TRIG 80.0 12/13/2016   Lab Results  Component Value Date   CHOLHDL 3 12/13/2016   Lab Results  Component Value Date   HGBA1C 6.2 (H) 07/19/2011       Assessment & Plan:   Problem List Items Addressed This Visit    CAD (coronary artery disease) (Chronic)   Relevant Orders   TSH (Completed)   HTN (hypertension) (Chronic)    Well controlled, no changes to meds. Encouraged heart healthy diet such as the DASH diet and exercise as tolerated.       Relevant Orders   CBC (Completed)   Comprehensive metabolic panel (Completed)   TSH (Completed)   Anemia    Taking daily iron and monthly Vitamin B12 monthly. And report concerning symptoms. Check cbc and vitamin B12 levels today      Relevant Medications   cyanocobalamin ((VITAMIN B-12)) injection 1,000 mcg (Completed)   Other Relevant Orders   CBC (Completed)   Atrial fibrillation (HCC)    Tolerating Coumadin, following with cardiology for INR checks      Relevant Orders  TSH (Completed)   Hyperlipidemia    Encouraged heart healthy diet, increase exercise, avoid trans fats, consider a krill oil cap daily. Check lipid day      Relevant Orders   Lipid panel (Completed)   TSH (Completed)   Bradycardia    Regular rate today      B12 deficiency    Given Vitamin B 12 shot today      Relevant Orders   Comprehensive metabolic panel (Completed)   Left hand pain    Check uric acid level today. She had an episode recently of sudden onset red, swollen painful left hand was seen at an urgent care and they  questioned if she had cellulitis, treated as such and improved. Will check labs today      Relevant Orders   Uric acid (Completed)    Other Visit Diagnoses    Vitamin B12 deficiency    -  Primary   Relevant Medications   cyanocobalamin ((VITAMIN B-12)) injection 1,000 mcg (Completed)   Other Relevant Orders   Vitamin B12 (Completed)      I am having Ms. Muffley maintain her BREO ELLIPTA, PROAIR HFA, isosorbide mononitrate, acetaminophen, furosemide, ipratropium-albuterol, omeprazole, nitroGLYCERIN, aspirin EC, warfarin, hydroxypropyl methylcellulose / hypromellose, amLODipine, ferrous sulfate, warfarin, and potassium chloride. We administered cyanocobalamin.  Meds ordered this encounter  Medications  . cyanocobalamin ((VITAMIN B-12)) injection 1,000 mcg    CMA served as scribe during this visit. History, Physical and Plan performed by medical provider. Documentation and orders reviewed and attested to.  Penni Homans, MD

## 2016-12-13 NOTE — Assessment & Plan Note (Signed)
Given Vitamin B 12 shot today 

## 2016-12-13 NOTE — Assessment & Plan Note (Signed)
Well controlled, no changes to meds. Encouraged heart healthy diet such as the DASH diet and exercise as tolerated.  °

## 2016-12-13 NOTE — Assessment & Plan Note (Signed)
Taking daily iron and monthly Vitamin B12 monthly. And report concerning symptoms. Check cbc and vitamin B12 levels today

## 2016-12-13 NOTE — Assessment & Plan Note (Signed)
Encouraged heart healthy diet, increase exercise, avoid trans fats, consider a krill oil cap daily. Check lipid day

## 2016-12-19 NOTE — Assessment & Plan Note (Signed)
Regular rate today

## 2016-12-27 DIAGNOSIS — I059 Rheumatic mitral valve disease, unspecified: Secondary | ICD-10-CM | POA: Diagnosis not present

## 2016-12-27 DIAGNOSIS — I480 Paroxysmal atrial fibrillation: Secondary | ICD-10-CM | POA: Diagnosis not present

## 2016-12-27 DIAGNOSIS — E785 Hyperlipidemia, unspecified: Secondary | ICD-10-CM | POA: Diagnosis not present

## 2016-12-27 DIAGNOSIS — I2510 Atherosclerotic heart disease of native coronary artery without angina pectoris: Secondary | ICD-10-CM | POA: Diagnosis not present

## 2016-12-27 DIAGNOSIS — I10 Essential (primary) hypertension: Secondary | ICD-10-CM | POA: Diagnosis not present

## 2016-12-27 DIAGNOSIS — I369 Nonrheumatic tricuspid valve disorder, unspecified: Secondary | ICD-10-CM | POA: Diagnosis not present

## 2016-12-29 DIAGNOSIS — J449 Chronic obstructive pulmonary disease, unspecified: Secondary | ICD-10-CM | POA: Diagnosis not present

## 2016-12-29 DIAGNOSIS — I10 Essential (primary) hypertension: Secondary | ICD-10-CM | POA: Diagnosis not present

## 2016-12-29 DIAGNOSIS — J9611 Chronic respiratory failure with hypoxia: Secondary | ICD-10-CM | POA: Diagnosis not present

## 2017-01-12 ENCOUNTER — Ambulatory Visit (INDEPENDENT_AMBULATORY_CARE_PROVIDER_SITE_OTHER): Payer: Medicare Other

## 2017-01-12 DIAGNOSIS — E538 Deficiency of other specified B group vitamins: Secondary | ICD-10-CM | POA: Diagnosis not present

## 2017-01-12 MED ORDER — CYANOCOBALAMIN 1000 MCG/ML IJ SOLN
1000.0000 ug | Freq: Once | INTRAMUSCULAR | Status: AC
  Administered 2017-01-12: 1000 ug via INTRAMUSCULAR

## 2017-01-12 NOTE — Progress Notes (Signed)
Pre visit review using our clinic tool,if applicable. No additional management support is needed unless otherwise documented below in the visit note.  yth Patient in for B12 injection per order from Dr. Penni Homans. Patient has had no complaints this am. Taking all medications as ordered.   Given 1000 mg IM Right deltoid. Return appointment given for 1 month.

## 2017-01-28 DIAGNOSIS — I10 Essential (primary) hypertension: Secondary | ICD-10-CM | POA: Diagnosis not present

## 2017-01-28 DIAGNOSIS — I2510 Atherosclerotic heart disease of native coronary artery without angina pectoris: Secondary | ICD-10-CM | POA: Diagnosis not present

## 2017-01-28 DIAGNOSIS — I059 Rheumatic mitral valve disease, unspecified: Secondary | ICD-10-CM | POA: Diagnosis not present

## 2017-01-28 DIAGNOSIS — I369 Nonrheumatic tricuspid valve disorder, unspecified: Secondary | ICD-10-CM | POA: Diagnosis not present

## 2017-01-28 DIAGNOSIS — I480 Paroxysmal atrial fibrillation: Secondary | ICD-10-CM | POA: Diagnosis not present

## 2017-01-28 DIAGNOSIS — E785 Hyperlipidemia, unspecified: Secondary | ICD-10-CM | POA: Diagnosis not present

## 2017-02-09 ENCOUNTER — Ambulatory Visit (INDEPENDENT_AMBULATORY_CARE_PROVIDER_SITE_OTHER): Payer: Medicare Other

## 2017-02-09 DIAGNOSIS — E538 Deficiency of other specified B group vitamins: Secondary | ICD-10-CM

## 2017-02-09 MED ORDER — CYANOCOBALAMIN 1000 MCG/ML IJ SOLN: 1000.0000 ug | Freq: Once | INTRAMUSCULAR | 0 refills | Status: AC

## 2017-02-09 MED ORDER — CYANOCOBALAMIN 1000 MCG/ML IJ SOLN: 1000.0000 ug | Freq: Once | INTRAMUSCULAR | 0 refills | Status: DC

## 2017-02-09 MED ORDER — CYANOCOBALAMIN 1000 MCG/ML IJ SOLN
1000.0000 ug | Freq: Once | INTRAMUSCULAR | Status: AC
  Administered 2017-02-09: 1000 ug via INTRAMUSCULAR

## 2017-02-09 NOTE — Addendum Note (Signed)
Addended by: Bunnie Domino on: 02/09/2017 10:52 AM   Modules accepted: Orders

## 2017-02-09 NOTE — Progress Notes (Signed)
Pre visit review using our clinic tool,if applicable. No additional management support is needed unless otherwise documented below in the visit note.  

## 2017-02-10 ENCOUNTER — Telehealth: Payer: Self-pay | Admitting: Family Medicine

## 2017-02-10 NOTE — Telephone Encounter (Signed)
Pt called questioning RX sent in yesterday for b12 since she was just here for b12 shot

## 2017-02-10 NOTE — Telephone Encounter (Signed)
Spoke with patient. Called pharmacy to cancel medication.

## 2017-03-01 DIAGNOSIS — I10 Essential (primary) hypertension: Secondary | ICD-10-CM | POA: Diagnosis not present

## 2017-03-01 DIAGNOSIS — I2510 Atherosclerotic heart disease of native coronary artery without angina pectoris: Secondary | ICD-10-CM | POA: Diagnosis not present

## 2017-03-01 DIAGNOSIS — E785 Hyperlipidemia, unspecified: Secondary | ICD-10-CM | POA: Diagnosis not present

## 2017-03-01 DIAGNOSIS — I369 Nonrheumatic tricuspid valve disorder, unspecified: Secondary | ICD-10-CM | POA: Diagnosis not present

## 2017-03-01 DIAGNOSIS — I480 Paroxysmal atrial fibrillation: Secondary | ICD-10-CM | POA: Diagnosis not present

## 2017-03-01 DIAGNOSIS — I059 Rheumatic mitral valve disease, unspecified: Secondary | ICD-10-CM | POA: Diagnosis not present

## 2017-03-11 ENCOUNTER — Other Ambulatory Visit: Payer: Self-pay

## 2017-03-11 ENCOUNTER — Ambulatory Visit (INDEPENDENT_AMBULATORY_CARE_PROVIDER_SITE_OTHER): Payer: Medicare Other | Admitting: Family Medicine

## 2017-03-11 ENCOUNTER — Encounter: Payer: Self-pay | Admitting: Family Medicine

## 2017-03-11 VITALS — BP 134/86 | HR 55 | Temp 98.6°F | Wt 130.8 lb

## 2017-03-11 DIAGNOSIS — D649 Anemia, unspecified: Secondary | ICD-10-CM

## 2017-03-11 DIAGNOSIS — I10 Essential (primary) hypertension: Secondary | ICD-10-CM

## 2017-03-11 DIAGNOSIS — D508 Other iron deficiency anemias: Secondary | ICD-10-CM

## 2017-03-11 DIAGNOSIS — E538 Deficiency of other specified B group vitamins: Secondary | ICD-10-CM

## 2017-03-11 DIAGNOSIS — K219 Gastro-esophageal reflux disease without esophagitis: Secondary | ICD-10-CM

## 2017-03-11 DIAGNOSIS — J439 Emphysema, unspecified: Secondary | ICD-10-CM | POA: Diagnosis not present

## 2017-03-11 DIAGNOSIS — E785 Hyperlipidemia, unspecified: Secondary | ICD-10-CM

## 2017-03-11 DIAGNOSIS — I25119 Atherosclerotic heart disease of native coronary artery with unspecified angina pectoris: Secondary | ICD-10-CM

## 2017-03-11 MED ORDER — CYANOCOBALAMIN 1000 MCG/ML IJ SOLN
1000.0000 ug | Freq: Once | INTRAMUSCULAR | Status: AC
  Administered 2017-03-11: 1000 ug via INTRAMUSCULAR

## 2017-03-11 NOTE — Progress Notes (Signed)
Patient ID: Yesenia Woods, female   DOB: 1934-12-20, 82 y.o.   MRN: [id number]   Subjective:  I acted as a Education administrator for Penni Homans, [place], [place]   Patient ID: Yesenia Woods, female    DOB: Dec 04, 1934, 82 y.o.   MRN: [id number]  Chief Complaint  Patient presents with  . Follow-up    Vitamin B-12 Deficiency.    HPI  Patient is in today for a 37-month follow up. Patient has a Hx of Vitamin B-12 Deficiency, HTN, GERD, anemia. Patient has no acute concerns noted at this time. She feels stronger and better today than she has in several months. No recent febrile illness or hospitalization. No bloody or tarry stool. Denies CP/palp/SOB/HA/congestion/fevers/GI or GU c/o. Taking meds as prescribed  Patient Care Team: Mosie Lukes, MD as PCP - General (Family Medicine) Delanna Notice, MD as Referring Physician (Internal Medicine) Vicente Males, MD (Pulmonary Disease)   Past Medical History:  Diagnosis Date  . Anemia   . Arthritis   . Atrial fibrillation ([place])   . B12 deficiency 04/10/2015  . Breast cancer ([place]) 12/09/2014   Mastectomy on left, chemo x 3 doses  . CAD (coronary artery disease)    Status post LIMA to LAD at [facility] 04/1989  . Chronic respiratory failure with hypoxia ([place])   . COPD (chronic obstructive pulmonary disease) ([place])   . Diverticulitis   . Duodenal ulcer   . Essential hypertension   . Falls 01/18/2016  . Headache(784.0)   . Hyperlipidemia   . Left hand pain 12/13/2016  . Oxygen dependent   . Small bowel arteriovenous malformation     Past Surgical History:  Procedure Laterality Date  . COLONOSCOPY  07/22/2011   Procedure: COLONOSCOPY;  Surgeon: Rogene Houston, MD;  [facility];  Service: Endoscopy;  Laterality: N/A;  . CORONARY ARTERY BYPASS GRAFT  1990   [facility] - LIMA to LAD  . ESOPHAGOGASTRODUODENOSCOPY  07/22/2011   Procedure: ESOPHAGOGASTRODUODENOSCOPY (EGD);  Surgeon: Rogene Houston, MD;  [facility];  Service: Endoscopy;   Laterality: N/A;  . ESOPHAGOGASTRODUODENOSCOPY N/A 04/23/2013   Procedure: ESOPHAGOGASTRODUODENOSCOPY (EGD);  Surgeon: Rogene Houston, MD;  [facility];  Service: Endoscopy;  Laterality: N/A;  . EYE SURGERY    . GIVENS CAPSULE STUDY  07/30/2011   Procedure: GIVENS CAPSULE STUDY;  Surgeon: Rogene Houston, MD;  [facility];  Service: Endoscopy;  Laterality: N/A;  7:30  . Hand surgery Right   . MASTECTOMY Left     Family History  Problem Relation Age of Onset  . Prostate cancer Brother   . Prostate cancer Brother   . Breast cancer Daughter     Breast Cancer that mets  . Throat cancer Daughter   . Healthy Daughter   . Healthy Son   . Diabetes Son   . Hypertension Son   . Healthy Son   . Healthy Son   . Healthy Son   . Heart disease Son     Cardiac arrythmia, with defibrillator  . Healthy Son   . Prostate cancer Son   . CAD Son     Social History   Social History  . Marital status: Widowed    Spouse name: N/A  . Number of children: N/A  . Years of education: N/A   Occupational History  . Not on file.   Social History Main Topics  . Smoking status: Former Smoker    Packs/day: 1.00  Years: 30.00    Types: Cigarettes    Quit date: 10/14/1994  . Smokeless tobacco: Never Used     Comment: Patient quit smoking 25 years ago  . Alcohol use No     Comment: none for 8 months, wine occasionally  . Drug use: No  . 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    Other Topics Concern  . Not on file   Social History Narrative  . No narrative on file    Outpatient Medications Prior to Visit  Medication Sig Dispense Refill  . acetaminophen (TYLENOL) 500 MG tablet Take 1,000 mg by mouth daily as needed for moderate pain.    Marland Kitchen amLODipine (NORVASC) 5 MG tablet Take 1 tablet (5 mg total) by mouth daily. 30 tablet 1  . aspirin EC 81 MG tablet Take 81 mg by mouth daily.    Marland Kitchen BREO ELLIPTA 100-25  MCG/INH AEPB Inhale 1 puff into the lungs daily.     . ferrous sulfate 325 (65 FE) MG tablet Take 1 tablet (325 mg total) by mouth 2 (two) times daily with a meal. (Patient taking differently: Take 325 mg by mouth daily with breakfast. ) 30 tablet 3  . furosemide (LASIX) 40 MG tablet Take 1 tablet (40 mg total) by mouth daily as needed for fluid or edema. 30 tablet 0  . hydroxypropyl methylcellulose / hypromellose (ISOPTO TEARS / GONIOVISC) 2.5 % ophthalmic solution Place 1 drop into both eyes daily as needed for dry eyes.    Marland Kitchen ipratropium-albuterol (DUONEB) 0.5-2.5 (3) MG/3ML SOLN Take 3 mLs by nebulization every 6 (six) hours as needed (shortness of breath).     . isosorbide mononitrate (IMDUR) 30 MG 24 hr tablet Take 30 mg by mouth 2 (two) times daily.     . nitroGLYCERIN (NITROSTAT) 0.4 MG SL tablet Place 1 tablet (0.4 mg total) under the tongue every 5 (five) minutes as needed for chest pain. If no relief after 3 doses to ER 25 tablet 3  . omeprazole (PRILOSEC) 20 MG capsule Take 1 capsule by mouth daily.    . potassium chloride (K-DUR) 10 MEQ tablet Take 1 tablet (10 mEq total) by mouth daily. 30 tablet 0  . PROAIR HFA 108 (90 BASE) MCG/ACT inhaler Inhale 2 puffs into the lungs every 6 (six) hours as needed for wheezing or shortness of breath.     . warfarin (COUMADIN) 7.5 MG tablet Take 7.5 mg by mouth daily. Patient take 7.5mg  on Monday only and 4mg  all other days    . warfarin (COUMADIN) 4 MG tablet Take 1 tablet (4 mg total) by mouth daily. Take as directed. Take 4 mg on Monday and 7.5 mg Tuesday-Sunday. (Patient taking differently: Take 4 mg by mouth daily. Patient takes 7.5mg  on Monday and 4mg  all other days)     No facility-administered medications prior to visit.     No Known Allergies  Review of Systems  Constitutional: Positive for malaise/fatigue. Negative for fever.  HENT: Negative for congestion.   Eyes: Negative for blurred vision.  Respiratory: Negative for cough and  shortness of breath.   Cardiovascular: Negative for chest pain, palpitations and leg swelling.  Gastrointestinal: Negative for vomiting.  Musculoskeletal: Negative for back pain.  Skin: Negative for rash.  Neurological: Negative for loss of consciousness and headaches.       Objective:    Physical Exam  Constitutional: She is oriented to person, place, and time.  She appears well-developed and well-nourished. No distress.  HENT:  Head: Normocephalic and atraumatic.  Eyes: Conjunctivae are normal.  Neck: Normal range of motion. No thyromegaly present.  Cardiovascular: Normal rate.   Murmur heard. Pulmonary/Chest: Effort normal and breath sounds normal. She has no wheezes.  Abdominal: Soft. Bowel sounds are normal. There is no tenderness.  Musculoskeletal: She exhibits no edema or deformity.  Neurological: She is alert and oriented to person, place, and time.  Skin: Skin is warm and dry. She is not diaphoretic.  Psychiatric: She has a normal mood and affect.    BP 134/86   Pulse (!) 55   Temp 98.6 F (37 C) (Oral)   Wt 130 lb 12.8 oz (59.3 kg)   SpO2 100% Comment: O2  BMI (P) 25.55 kg/m  Wt Readings from Last 3 Encounters:  03/11/17 130 lb 12.8 oz (59.3 kg)  12/13/16 129 lb 9.6 oz (58.8 kg)  09/02/16 (P) 138 lb 6.4 oz (62.8 kg)   BP Readings from Last 3 Encounters:  03/13/17 134/86  12/13/16 100/68  09/02/16 (!) (P) 177/67     Immunization History  Administered Date(s) Administered  . Influenza, High Dose Seasonal PF 09/02/2016  . Influenza,inj,Quad PF,36+ Mos 10/16/2014, 08/29/2015  . Pneumococcal-Unspecified 11/16/2011  . Td 07/05/2016    Health Maintenance  Topic Date Due  . DEXA SCAN  04/26/2017 (Originally 01/22/2002)  . PNA vac Low Risk Adult (2 of 2 - PCV13) 04/26/2017 (Originally 11/15/2012)  . INFLUENZA VACCINE  06/15/2017  . TETANUS/TDAP  07/05/2026    Lab Results  Component Value Date   WBC 6.2 12/13/2016   HGB 11.4 (L) 12/13/2016   HCT 36.3  12/13/2016   PLT 341.0 12/13/2016   GLUCOSE 91 12/13/2016   CHOL 148 12/13/2016   TRIG 80.0 12/13/2016   HDL 58.90 12/13/2016   LDLCALC 73 12/13/2016   ALT 8 12/13/2016   AST 13 12/13/2016   NA 140 12/13/2016   K 3.5 12/13/2016   CL 105 12/13/2016   CREATININE 0.66 12/13/2016   BUN 7 12/13/2016   CO2 26 12/13/2016   TSH 1.65 12/13/2016   INR 2.41 (H) 06/04/2015   HGBA1C 6.2 (H) 07/19/2011    Lab Results  Component Value Date   TSH 1.65 12/13/2016   Lab Results  Component Value Date   WBC 6.2 12/13/2016   HGB 11.4 (L) 12/13/2016   HCT 36.3 12/13/2016   MCV 65.1 Repeated and verified X2. (L) 12/13/2016   PLT 341.0 12/13/2016   Lab Results  Component Value Date   NA 140 12/13/2016   K 3.5 12/13/2016   CO2 26 12/13/2016   GLUCOSE 91 12/13/2016   BUN 7 12/13/2016   CREATININE 0.66 12/13/2016   BILITOT 0.7 12/13/2016   ALKPHOS 85 12/13/2016   AST 13 12/13/2016   ALT 8 12/13/2016   PROT 6.6 12/13/2016   ALBUMIN 3.7 12/13/2016   CALCIUM 9.8 12/13/2016   ANIONGAP 8 03/10/2016   GFR 110.85 12/13/2016   Lab Results  Component Value Date   CHOL 148 12/13/2016   Lab Results  Component Value Date   HDL 58.90 12/13/2016   Lab Results  Component Value Date   LDLCALC 73 12/13/2016   Lab Results  Component Value Date   TRIG 80.0 12/13/2016   Lab Results  Component Value Date   CHOLHDL 3 12/13/2016   Lab Results  Component Value Date   HGBA1C 6.2 (H) 07/19/2011         Assessment &  Plan:   Problem List Items Addressed This Visit    CAD (coronary artery disease) (Chronic)    Follows with cardiology and they manage her coumadin      HTN (hypertension) (Chronic)    Improved on recheck and she is seeing cardiology Dr Mina Marble at [facility] next week. She feels well no changes      Iron deficiency anemia    Improved and stable continue current meds.       Relevant Medications   cyanocobalamin ((VITAMIN B-12)) injection 1,000 mcg (Completed)   GERD  (gastroesophageal reflux disease)    Avoid offending foods, start probiotics. Do not eat large meals in late evening and consider raising head of bed.       Hyperlipidemia    Encouraged heart healthy diet, increase exercise, avoid trans fats, consider a krill oil cap daily      COPD (chronic obstructive pulmonary disease) ([place])    O2 via [place], stable and following with pulmonology      B12 deficiency    Given Vitamin B 12 shot today       Other Visit Diagnoses    Vitamin B12 deficiency    -  Primary   Relevant Medications   cyanocobalamin ((VITAMIN B-12)) injection 1,000 mcg (Completed)      I am having Ms. Briones maintain her BREO ELLIPTA, PROAIR HFA, isosorbide mononitrate, acetaminophen, furosemide, ipratropium-albuterol, omeprazole, nitroGLYCERIN, aspirin EC, warfarin, hydroxypropyl methylcellulose / hypromellose, amLODipine, ferrous sulfate, and potassium chloride. We administered cyanocobalamin.  Meds ordered this encounter  Medications  . cyanocobalamin ((VITAMIN B-12)) injection 1,000 mcg    CMA served as scribe during this visit. History, Physical and Plan performed by medical provider. Documentation and orders reviewed and attested to.  Penni Homans, MD

## 2017-03-11 NOTE — Patient Instructions (Signed)
Omron blood pressure monitor Hypertension Hypertension, commonly called high blood pressure, is when the force of blood pumping through the arteries is too strong. The arteries are the blood vessels that carry blood from the heart throughout the body. Hypertension forces the heart to work harder to pump blood and may cause arteries to become narrow or stiff. Having untreated or uncontrolled hypertension can cause heart attacks, strokes, kidney disease, and other problems. A blood pressure reading consists of a higher number over a lower number. Ideally, your blood pressure should be below 120/80. The first ("top") number is called the systolic pressure. It is a measure of the pressure in your arteries as your heart beats. The second ("bottom") number is called the diastolic pressure. It is a measure of the pressure in your arteries as the heart relaxes. What are the causes? The cause of this condition is not known. What increases the risk? Some risk factors for high blood pressure are under your control. Others are not. Factors you can change   Smoking.  Having type 2 diabetes mellitus, high cholesterol, or both.  Not getting enough exercise or physical activity.  Being overweight.  Having too much fat, sugar, calories, or salt (sodium) in your diet.  Drinking too much alcohol. Factors that are difficult or impossible to change   Having chronic kidney disease.  Having a family history of high blood pressure.  Age. Risk increases with age.  Race. You may be at higher risk if you are African-American.  Gender. Men are at higher risk than women before age 28. After age 52, women are at higher risk than men.  Having obstructive sleep apnea.  Stress. What are the signs or symptoms? Extremely high blood pressure (hypertensive crisis) may cause:  Headache.  Anxiety.  Shortness of breath.  Nosebleed.  Nausea and vomiting.  Severe chest pain.  Jerky movements you cannot  control (seizures). How is this diagnosed? This condition is diagnosed by measuring your blood pressure while you are seated, with your arm resting on a surface. The cuff of the blood pressure monitor will be placed directly against the skin of your upper arm at the level of your heart. It should be measured at least twice using the same arm. Certain conditions can cause a difference in blood pressure between your right and left arms. Certain factors can cause blood pressure readings to be lower or higher than normal (elevated) for a short period of time:  When your blood pressure is higher when you are in a health care provider's office than when you are at home, this is called white coat hypertension. Most people with this condition do not need medicines.  When your blood pressure is higher at home than when you are in a health care provider's office, this is called masked hypertension. Most people with this condition may need medicines to control blood pressure. If you have a high blood pressure reading during one visit or you have normal blood pressure with other risk factors:  You may be asked to return on a different day to have your blood pressure checked again.  You may be asked to monitor your blood pressure at home for 1 week or longer. If you are diagnosed with hypertension, you may have other blood or imaging tests to help your health care provider understand your overall risk for other conditions. How is this treated? This condition is treated by making healthy lifestyle changes, such as eating healthy foods, exercising more, and reducing your  alcohol intake. Your health care provider may prescribe medicine if lifestyle changes are not enough to get your blood pressure under control, and if:  Your systolic blood pressure is above 130.  Your diastolic blood pressure is above 80. Your personal target blood pressure may vary depending on your medical conditions, your age, and other  factors. Follow these instructions at home: Eating and drinking   Eat a diet that is high in fiber and potassium, and low in sodium, added sugar, and fat. An example eating plan is called the DASH (Dietary Approaches to Stop Hypertension) diet. To eat this way:  Eat plenty of fresh fruits and vegetables. Try to fill half of your plate at each meal with fruits and vegetables.  Eat whole grains, such as whole wheat pasta, brown rice, or whole grain bread. Fill about one quarter of your plate with whole grains.  Eat or drink low-fat dairy products, such as skim milk or low-fat yogurt.  Avoid fatty cuts of meat, processed or cured meats, and poultry with skin. Fill about one quarter of your plate with lean proteins, such as fish, chicken without skin, beans, eggs, and tofu.  Avoid premade and processed foods. These tend to be higher in sodium, added sugar, and fat.  Reduce your daily sodium intake. Most people with hypertension should eat less than 1,500 mg of sodium a day.  Limit alcohol intake to no more than 1 drink a day for nonpregnant women and 2 drinks a day for men. One drink equals 12 oz of beer, 5 oz of wine, or 1 oz of hard liquor. Lifestyle   Work with your health care provider to maintain a healthy body weight or to lose weight. Ask what an ideal weight is for you.  Get at least 30 minutes of exercise that causes your heart to beat faster (aerobic exercise) most days of the week. Activities may include walking, swimming, or biking.  Include exercise to strengthen your muscles (resistance exercise), such as pilates or lifting weights, as part of your weekly exercise routine. Try to do these types of exercises for 30 minutes at least 3 days a week.  Do not use any products that contain nicotine or tobacco, such as cigarettes and e-cigarettes. If you need help quitting, ask your health care provider.  Monitor your blood pressure at home as told by your health care  provider.  Keep all follow-up visits as told by your health care provider. This is important. Medicines   Take over-the-counter and prescription medicines only as told by your health care provider. Follow directions carefully. Blood pressure medicines must be taken as prescribed.  Do not skip doses of blood pressure medicine. Doing this puts you at risk for problems and can make the medicine less effective.  Ask your health care provider about side effects or reactions to medicines that you should watch for. Contact a health care provider if:  You think you are having a reaction to a medicine you are taking.  You have headaches that keep coming back (recurring).  You feel dizzy.  You have swelling in your ankles.  You have trouble with your vision. Get help right away if:  You develop a severe headache or confusion.  You have unusual weakness or numbness.  You feel faint.  You have severe pain in your chest or abdomen.  You vomit repeatedly.  You have trouble breathing. Summary  Hypertension is when the force of blood pumping through your arteries is too strong.  If this condition is not controlled, it may put you at risk for serious complications.  Your personal target blood pressure may vary depending on your medical conditions, your age, and other factors. For most people, a normal blood pressure is less than 120/80.  Hypertension is treated with lifestyle changes, medicines, or a combination of both. Lifestyle changes include weight loss, eating a healthy, low-sodium diet, exercising more, and limiting alcohol. This information is not intended to replace advice given to you by your health care provider. Make sure you discuss any questions you have with your health care provider. Document Released: 11/01/2005 Document Revised: 09/29/2016 Document Reviewed: 09/29/2016 Elsevier Interactive Patient Education  2017 [employer].

## 2017-03-11 NOTE — Assessment & Plan Note (Signed)
Improved on recheck and she is seeing cardiology Dr Mina Marble at [facility] next week. She feels well no changes

## 2017-03-11 NOTE — Progress Notes (Signed)
Pre visit review using our clinic review tool, if applicable. No additional management support is needed unless otherwise documented below in the visit note. 

## 2017-03-13 NOTE — Assessment & Plan Note (Signed)
Given Vitamin B 12 shot today 

## 2017-03-13 NOTE — Assessment & Plan Note (Signed)
Avoid offending foods, start probiotics. Do not eat large meals in late evening and consider raising head of bed.  

## 2017-03-13 NOTE — Assessment & Plan Note (Signed)
Encouraged heart healthy diet, increase exercise, avoid trans fats, consider a krill oil cap daily 

## 2017-03-13 NOTE — Assessment & Plan Note (Signed)
O2 via [place], stable and following with pulmonology

## 2017-03-13 NOTE — Assessment & Plan Note (Signed)
Follows with cardiology and they manage her coumadin

## 2017-03-13 NOTE — Assessment & Plan Note (Signed)
Improved and stable continue current meds.

## 2017-03-17 DIAGNOSIS — I480 Paroxysmal atrial fibrillation: Secondary | ICD-10-CM | POA: Diagnosis not present

## 2017-03-17 DIAGNOSIS — I059 Rheumatic mitral valve disease, unspecified: Secondary | ICD-10-CM | POA: Diagnosis not present

## 2017-03-17 DIAGNOSIS — I369 Nonrheumatic tricuspid valve disorder, unspecified: Secondary | ICD-10-CM | POA: Diagnosis not present

## 2017-03-17 DIAGNOSIS — E785 Hyperlipidemia, unspecified: Secondary | ICD-10-CM | POA: Diagnosis not present

## 2017-03-17 DIAGNOSIS — I10 Essential (primary) hypertension: Secondary | ICD-10-CM | POA: Diagnosis not present

## 2017-03-17 DIAGNOSIS — I2510 Atherosclerotic heart disease of native coronary artery without angina pectoris: Secondary | ICD-10-CM | POA: Diagnosis not present

## 2017-04-07 DIAGNOSIS — I482 Chronic atrial fibrillation: Secondary | ICD-10-CM | POA: Diagnosis not present

## 2017-04-07 DIAGNOSIS — I340 Nonrheumatic mitral (valve) insufficiency: Secondary | ICD-10-CM | POA: Diagnosis not present

## 2017-04-18 DIAGNOSIS — I480 Paroxysmal atrial fibrillation: Secondary | ICD-10-CM | POA: Diagnosis not present

## 2017-04-18 DIAGNOSIS — I369 Nonrheumatic tricuspid valve disorder, unspecified: Secondary | ICD-10-CM | POA: Diagnosis not present

## 2017-04-18 DIAGNOSIS — I2510 Atherosclerotic heart disease of native coronary artery without angina pectoris: Secondary | ICD-10-CM | POA: Diagnosis not present

## 2017-04-18 DIAGNOSIS — E785 Hyperlipidemia, unspecified: Secondary | ICD-10-CM | POA: Diagnosis not present

## 2017-04-18 DIAGNOSIS — I10 Essential (primary) hypertension: Secondary | ICD-10-CM | POA: Diagnosis not present

## 2017-04-18 DIAGNOSIS — I059 Rheumatic mitral valve disease, unspecified: Secondary | ICD-10-CM | POA: Diagnosis not present

## 2017-05-24 DIAGNOSIS — Z853 Personal history of malignant neoplasm of breast: Secondary | ICD-10-CM | POA: Diagnosis not present

## 2017-05-27 DIAGNOSIS — Z9012 Acquired absence of left breast and nipple: Secondary | ICD-10-CM | POA: Diagnosis not present

## 2017-05-27 DIAGNOSIS — I2510 Atherosclerotic heart disease of native coronary artery without angina pectoris: Secondary | ICD-10-CM | POA: Diagnosis not present

## 2017-05-27 DIAGNOSIS — I161 Hypertensive emergency: Secondary | ICD-10-CM | POA: Diagnosis not present

## 2017-05-27 DIAGNOSIS — D649 Anemia, unspecified: Secondary | ICD-10-CM | POA: Diagnosis present

## 2017-05-27 DIAGNOSIS — R0789 Other chest pain: Secondary | ICD-10-CM | POA: Diagnosis not present

## 2017-05-27 DIAGNOSIS — R51 Headache: Secondary | ICD-10-CM | POA: Diagnosis not present

## 2017-05-27 DIAGNOSIS — R7989 Other specified abnormal findings of blood chemistry: Secondary | ICD-10-CM | POA: Diagnosis present

## 2017-05-27 DIAGNOSIS — I340 Nonrheumatic mitral (valve) insufficiency: Secondary | ICD-10-CM | POA: Diagnosis present

## 2017-05-27 DIAGNOSIS — I252 Old myocardial infarction: Secondary | ICD-10-CM | POA: Diagnosis not present

## 2017-05-27 DIAGNOSIS — Z9981 Dependence on supplemental oxygen: Secondary | ICD-10-CM | POA: Diagnosis not present

## 2017-05-27 DIAGNOSIS — J449 Chronic obstructive pulmonary disease, unspecified: Secondary | ICD-10-CM | POA: Diagnosis not present

## 2017-05-27 DIAGNOSIS — Z853 Personal history of malignant neoplasm of breast: Secondary | ICD-10-CM | POA: Diagnosis not present

## 2017-05-27 DIAGNOSIS — Z7901 Long term (current) use of anticoagulants: Secondary | ICD-10-CM | POA: Diagnosis not present

## 2017-05-27 DIAGNOSIS — I482 Chronic atrial fibrillation: Secondary | ICD-10-CM | POA: Diagnosis present

## 2017-05-27 DIAGNOSIS — K219 Gastro-esophageal reflux disease without esophagitis: Secondary | ICD-10-CM | POA: Diagnosis present

## 2017-05-27 DIAGNOSIS — I160 Hypertensive urgency: Secondary | ICD-10-CM | POA: Diagnosis not present

## 2017-05-27 DIAGNOSIS — Z9181 History of falling: Secondary | ICD-10-CM | POA: Diagnosis not present

## 2017-05-27 DIAGNOSIS — E785 Hyperlipidemia, unspecified: Secondary | ICD-10-CM | POA: Diagnosis present

## 2017-05-27 DIAGNOSIS — R079 Chest pain, unspecified: Secondary | ICD-10-CM | POA: Diagnosis present

## 2017-05-27 DIAGNOSIS — Z951 Presence of aortocoronary bypass graft: Secondary | ICD-10-CM | POA: Diagnosis not present

## 2017-05-27 DIAGNOSIS — I10 Essential (primary) hypertension: Secondary | ICD-10-CM | POA: Diagnosis not present

## 2017-06-13 DIAGNOSIS — I059 Rheumatic mitral valve disease, unspecified: Secondary | ICD-10-CM | POA: Diagnosis not present

## 2017-06-13 DIAGNOSIS — I2510 Atherosclerotic heart disease of native coronary artery without angina pectoris: Secondary | ICD-10-CM | POA: Diagnosis not present

## 2017-06-13 DIAGNOSIS — I369 Nonrheumatic tricuspid valve disorder, unspecified: Secondary | ICD-10-CM | POA: Diagnosis not present

## 2017-06-13 DIAGNOSIS — E785 Hyperlipidemia, unspecified: Secondary | ICD-10-CM | POA: Diagnosis not present

## 2017-06-13 DIAGNOSIS — I10 Essential (primary) hypertension: Secondary | ICD-10-CM | POA: Diagnosis not present

## 2017-06-13 DIAGNOSIS — I480 Paroxysmal atrial fibrillation: Secondary | ICD-10-CM | POA: Diagnosis not present

## 2017-06-16 ENCOUNTER — Encounter: Payer: Self-pay | Admitting: Family Medicine

## 2017-06-16 ENCOUNTER — Ambulatory Visit (INDEPENDENT_AMBULATORY_CARE_PROVIDER_SITE_OTHER): Payer: Medicare Other | Admitting: Family Medicine

## 2017-06-16 VITALS — BP 136/62 | HR 55 | Temp 97.8°F | Resp 18 | Wt 124.6 lb

## 2017-06-16 DIAGNOSIS — R252 Cramp and spasm: Secondary | ICD-10-CM

## 2017-06-16 DIAGNOSIS — E785 Hyperlipidemia, unspecified: Secondary | ICD-10-CM

## 2017-06-16 DIAGNOSIS — I10 Essential (primary) hypertension: Secondary | ICD-10-CM

## 2017-06-16 DIAGNOSIS — D508 Other iron deficiency anemias: Secondary | ICD-10-CM

## 2017-06-16 DIAGNOSIS — I25119 Atherosclerotic heart disease of native coronary artery with unspecified angina pectoris: Secondary | ICD-10-CM

## 2017-06-16 DIAGNOSIS — I482 Chronic atrial fibrillation, unspecified: Secondary | ICD-10-CM

## 2017-06-16 DIAGNOSIS — E538 Deficiency of other specified B group vitamins: Secondary | ICD-10-CM

## 2017-06-16 LAB — COMPREHENSIVE METABOLIC PANEL
ALK PHOS: 84 U/L (ref 39–117)
ALT: 11 U/L (ref 0–35)
AST: 14 U/L (ref 0–37)
Albumin: 3.9 g/dL (ref 3.5–5.2)
BILIRUBIN TOTAL: 0.4 mg/dL (ref 0.2–1.2)
BUN: 24 mg/dL — ABNORMAL HIGH (ref 6–23)
CALCIUM: 9.9 mg/dL (ref 8.4–10.5)
CO2: 29 mEq/L (ref 19–32)
Chloride: 105 mEq/L (ref 96–112)
Creatinine, Ser: 0.82 mg/dL (ref 0.40–1.20)
GFR: 86.18 mL/min (ref >60.00)
Glucose, Bld: 89 mg/dL (ref 70–99)
Potassium: 3.6 mEq/L (ref 3.5–5.1)
Sodium: 140 mEq/L (ref 135–145)
TOTAL PROTEIN: 6.9 g/dL (ref 6.0–8.3)

## 2017-06-16 LAB — FERRITIN: FERRITIN: 18.3 ng/mL (ref 10.0–291.0)

## 2017-06-16 LAB — LIPID PANEL
CHOLESTEROL: 114 mg/dL (ref 0–200)
HDL: 45.3 mg/dL (ref >39.00)
LDL CALC: 52 mg/dL (ref 0–99)
NonHDL: 68.49
Total CHOL/HDL Ratio: 3
Triglycerides: 82 mg/dL (ref 0.0–149.0)
VLDL: 16.4 mg/dL (ref 0.0–40.0)

## 2017-06-16 LAB — CBC
HCT: 38.1 % (ref 36.0–46.0)
HEMOGLOBIN: 11.9 g/dL — AB (ref 12.0–15.0)
MCHC: 31.1 g/dL (ref 30.0–36.0)
MCV: 72.5 fl — ABNORMAL LOW (ref 78.0–100.0)
PLATELETS: 315 10*3/uL (ref 150.0–400.0)
RBC: 5.26 Mil/uL — ABNORMAL HIGH (ref 3.87–5.11)
RDW: 20.8 % — ABNORMAL HIGH (ref 11.5–15.5)
WBC: 7.9 10*3/uL (ref 4.0–10.5)

## 2017-06-16 LAB — TSH: TSH: 1.28 u[IU]/mL (ref 0.35–4.50)

## 2017-06-16 LAB — RETICULOCYTES
ABS Retic: 21000 cells/uL (ref 20000–80000)
RBC.: 5.25 MIL/uL — ABNORMAL HIGH (ref 3.80–5.10)
RETIC CT PCT: 0.4 %

## 2017-06-16 LAB — VITAMIN B12

## 2017-06-16 LAB — MAGNESIUM: MAGNESIUM: 1.8 mg/dL (ref 1.5–2.5)

## 2017-06-16 NOTE — Progress Notes (Signed)
Subjective:  I acted as a Education administrator for Dr. Charlett Blake. [place], [place]  Patient ID: Yesenia Woods, female    DOB: 05/28/1935, 82 y.o.   MRN: [id number]  No chief complaint on file.   HPI  Patient is in today for a 3 month follow up. She feels well today, no recent febrile illness. She continues to struggle with fatigue and malaise. She is eating OK at home. Denies CP/palp/SOB/HA/congestion/fevers/GI or GU c/o. Taking meds as prescribed  Patient Care Team: Mosie Lukes, MD as PCP - General (Family Medicine) Delanna Notice, MD as Referring Physician (Internal Medicine) Vicente Males, MD (Pulmonary Disease)   Past Medical History:  Diagnosis Date  . Anemia   . Arthritis   . Atrial fibrillation ([place])   . B12 deficiency 04/10/2015  . Breast cancer ([place]) 12/09/2014   Mastectomy on left, chemo x 3 doses  . CAD (coronary artery disease)    Status post LIMA to LAD at [facility] 04/1989  . Chronic respiratory failure with hypoxia ([place])   . COPD (chronic obstructive pulmonary disease) ([place])   . Diverticulitis   . Duodenal ulcer   . Essential hypertension   . Falls 01/18/2016  . Headache(784.0)   . Hyperlipidemia   . Left hand pain 12/13/2016  . Oxygen dependent   . Small bowel arteriovenous malformation     Past Surgical History:  Procedure Laterality Date  . COLONOSCOPY  07/22/2011   Procedure: COLONOSCOPY;  Surgeon: Rogene Houston, MD;  [facility];  Service: Endoscopy;  Laterality: N/A;  . CORONARY ARTERY BYPASS GRAFT  1990   [facility] - LIMA to LAD  . ESOPHAGOGASTRODUODENOSCOPY  07/22/2011   Procedure: ESOPHAGOGASTRODUODENOSCOPY (EGD);  Surgeon: Rogene Houston, MD;  [facility];  Service: Endoscopy;  Laterality: N/A;  . ESOPHAGOGASTRODUODENOSCOPY N/A 04/23/2013   Procedure: ESOPHAGOGASTRODUODENOSCOPY (EGD);  Surgeon: Rogene Houston, MD;  Location: [facility];  Service: Endoscopy;  Laterality: N/A;  . EYE SURGERY    . GIVENS CAPSULE STUDY  07/30/2011   Procedure:  GIVENS CAPSULE STUDY;  Surgeon: Rogene Houston, MD;  Location: [facility];  Service: Endoscopy;  Laterality: N/A;  7:30  . Hand surgery Right   . MASTECTOMY Left     Family History  Problem Relation Age of Onset  . Prostate cancer Brother   . Prostate cancer Brother   . Breast cancer Daughter        Breast Cancer that mets  . Throat cancer Daughter   . Healthy Daughter   . Healthy Son   . Diabetes Son   . Hypertension Son   . Healthy Son   . Healthy Son   . Healthy Son   . Heart disease Son        Cardiac arrythmia, with defibrillator  . Healthy Son   . Prostate cancer Son   . CAD Son     Social History   Social History  . Marital status: Widowed    Spouse name: N/A  . Number of children: N/A  . Years of education: N/A   Occupational History  . Not on file.   Social History Main Topics  . Smoking status: Former Smoker    Packs/day: 1.00    Years: 30.00    Types: Cigarettes    Quit date: 10/14/1994  . Smokeless tobacco: Never Used     Comment: Patient quit smoking 25 years ago  . Alcohol use No     Comment: none  for 8 months, wine occasionally  . Drug use: No  . 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    Other Topics Concern  . Not on file   Social History Narrative  . No narrative on file    Outpatient Medications Prior to Visit  Medication Sig Dispense Refill  . acetaminophen (TYLENOL) 500 MG tablet Take 1,000 mg by mouth daily as needed for moderate pain.    Marland Kitchen amLODipine (NORVASC) 5 MG tablet Take 1 tablet (5 mg total) by mouth daily. 30 tablet 1  . aspirin EC 81 MG tablet Take 81 mg by mouth daily.    Marland Kitchen BREO ELLIPTA 100-25 MCG/INH AEPB Inhale 1 puff into the lungs daily.     . ferrous sulfate 325 (65 FE) MG tablet Take 1 tablet (325 mg total) by mouth 2 (two) times daily with a meal. (Patient taking differently: Take 325 mg by mouth daily with breakfast. ) 30 tablet 3  . furosemide  (LASIX) 40 MG tablet Take 1 tablet (40 mg total) by mouth daily as needed for fluid or edema. 30 tablet 0  . hydroxypropyl methylcellulose / hypromellose (ISOPTO TEARS / GONIOVISC) 2.5 % ophthalmic solution Place 1 drop into both eyes daily as needed for dry eyes.    Marland Kitchen ipratropium-albuterol (DUONEB) 0.5-2.5 (3) MG/3ML SOLN Take 3 mLs by nebulization every 6 (six) hours as needed (shortness of breath).     . isosorbide mononitrate (IMDUR) 30 MG 24 hr tablet Take 30 mg by mouth 2 (two) times daily.     . nitroGLYCERIN (NITROSTAT) 0.4 MG SL tablet Place 1 tablet (0.4 mg total) under the tongue every 5 (five) minutes as needed for chest pain. If no relief after 3 doses to ER 25 tablet 3  . omeprazole (PRILOSEC) 20 MG capsule Take 1 capsule by mouth daily.    . potassium chloride (K-DUR) 10 MEQ tablet Take 1 tablet (10 mEq total) by mouth daily. 30 tablet 0  . PROAIR HFA 108 (90 BASE) MCG/ACT inhaler Inhale 2 puffs into the lungs every 6 (six) hours as needed for wheezing or shortness of breath.     . warfarin (COUMADIN) 7.5 MG tablet Take 7.5 mg by mouth daily. Patient take 7.5mg  on Monday only and 4mg  all other days     No facility-administered medications prior to visit.     No Known Allergies  Review of Systems  Constitutional: Positive for malaise/fatigue. Negative for fever.  HENT: Negative for congestion.   Eyes: Negative for blurred vision.  Respiratory: Negative for shortness of breath.   Cardiovascular: Negative for chest pain, palpitations and leg swelling.  Gastrointestinal: Negative for abdominal pain, blood in stool and nausea.  Genitourinary: Negative for dysuria and frequency.  Musculoskeletal: Negative for falls.  Skin: Negative for rash.  Neurological: Negative for dizziness, loss of consciousness and headaches.  Endo/Heme/Allergies: Negative for environmental allergies.  Psychiatric/Behavioral: Negative for depression. The patient is not nervous/anxious.        Objective:     Physical Exam  Constitutional: She is oriented to person, place, and time. She appears well-developed and well-nourished. No distress.  HENT:  Head: Normocephalic and atraumatic.  Nose: Nose normal.  Eyes: Right eye exhibits no discharge. Left eye exhibits no discharge.  Neck: Normal range of motion. Neck supple.  Cardiovascular: Normal rate.   Irregularly irregular  Pulmonary/Chest: Effort normal and breath sounds normal.  Abdominal: Soft. Bowel sounds are normal. There  is no tenderness.  Musculoskeletal: She exhibits no edema.  Neurological: She is alert and oriented to person, place, and time.  Skin: Skin is warm and dry.  Psychiatric: She has a normal mood and affect.  Nursing note and vitals reviewed.   BP 136/62 (BP Location: Left Arm, Patient Position: Sitting, Cuff Size: Normal)   Pulse (!) 55   Temp 97.8 F (36.6 C) (Oral)   Resp 18   Wt 124 lb 9.6 oz (56.5 kg)   SpO2 99%   BMI (P) 24.33 kg/m  Wt Readings from Last 3 Encounters:  06/16/17 124 lb 9.6 oz (56.5 kg)  03/11/17 130 lb 12.8 oz (59.3 kg)  12/13/16 129 lb 9.6 oz (58.8 kg)   BP Readings from Last 3 Encounters:  06/16/17 136/62  03/13/17 134/86  12/13/16 100/68     Immunization History  Administered Date(s) Administered  . Influenza, High Dose Seasonal PF 09/02/2016  . Influenza,inj,Quad PF,36+ Mos 10/16/2014, 08/29/2015  . Pneumococcal-Unspecified 11/16/2011  . Td 07/05/2016    Health Maintenance  Topic Date Due  . DEXA SCAN  01/22/2002  . PNA vac Low Risk Adult (2 of 2 - PCV13) 11/15/2012  . INFLUENZA VACCINE  06/15/2017  . TETANUS/TDAP  07/05/2026    Lab Results  Component Value Date   WBC 7.9 06/16/2017   HGB 11.9 (L) 06/16/2017   HCT 38.1 06/16/2017   PLT 315.0 06/16/2017   GLUCOSE 89 06/16/2017   CHOL 114 06/16/2017   TRIG 82.0 06/16/2017   HDL 45.30 06/16/2017   LDLCALC 52 06/16/2017   ALT 11 06/16/2017   AST 14 06/16/2017   NA 140 06/16/2017   K 3.6 06/16/2017   CL 105  06/16/2017   CREATININE 0.82 06/16/2017   BUN 24 (H) 06/16/2017   CO2 29 06/16/2017   TSH 1.28 06/16/2017   INR 2.41 (H) 06/04/2015   HGBA1C 6.2 (H) 07/19/2011    Lab Results  Component Value Date   TSH 1.28 06/16/2017   Lab Results  Component Value Date   WBC 7.9 06/16/2017   HGB 11.9 (L) 06/16/2017   HCT 38.1 06/16/2017   MCV 72.5 (L) 06/16/2017   PLT 315.0 06/16/2017   Lab Results  Component Value Date   NA 140 06/16/2017   K 3.6 06/16/2017   CO2 29 06/16/2017   GLUCOSE 89 06/16/2017   BUN 24 (H) 06/16/2017   CREATININE 0.82 06/16/2017   BILITOT 0.4 06/16/2017   ALKPHOS 84 06/16/2017   AST 14 06/16/2017   ALT 11 06/16/2017   PROT 6.9 06/16/2017   ALBUMIN 3.9 06/16/2017   CALCIUM 9.9 06/16/2017   ANIONGAP 8 03/10/2016   GFR 86.18 06/16/2017   Lab Results  Component Value Date   CHOL 114 06/16/2017   Lab Results  Component Value Date   HDL 45.30 06/16/2017   Lab Results  Component Value Date   LDLCALC 52 06/16/2017   Lab Results  Component Value Date   TRIG 82.0 06/16/2017   Lab Results  Component Value Date   CHOLHDL 3 06/16/2017   Lab Results  Component Value Date   HGBA1C 6.2 (H) 07/19/2011         Assessment & Plan:   Problem List Items Addressed This Visit    HTN (hypertension) - Primary (Chronic)    Well controlled, no changes to meds. Encouraged heart healthy diet such as the DASH diet and exercise as tolerated.       Relevant Medications   carvedilol (COREG)  6.25 MG tablet   chlorthalidone (HYGROTON) 50 MG tablet   lisinopril (PRINIVIL,ZESTRIL) 40 MG tablet   atorvastatin (LIPITOR) 40 MG tablet   Other Relevant Orders   CBC (Completed)   Comprehensive metabolic panel (Completed)   TSH (Completed)   Iron deficiency anemia    Check cbc today, hgb continues to improve.       Relevant Orders   Retic (Completed)   Ferritin (Completed)   Atrial fibrillation (HCC)    Tolerating Coumadin, rate controlled      Relevant  Medications   carvedilol (COREG) 6.25 MG tablet   chlorthalidone (HYGROTON) 50 MG tablet   lisinopril (PRINIVIL,ZESTRIL) 40 MG tablet   atorvastatin (LIPITOR) 40 MG tablet   Hyperlipidemia    Encouraged heart healthy diet, increase exercise, avoid trans fats, consider a krill oil cap daily      Relevant Medications   carvedilol (COREG) 6.25 MG tablet   chlorthalidone (HYGROTON) 50 MG tablet   lisinopril (PRINIVIL,ZESTRIL) 40 MG tablet   atorvastatin (LIPITOR) 40 MG tablet   Other Relevant Orders   Lipid panel (Completed)   B12 deficiency    Check vitamin B 12 today, has not had a vitamin B12 shot in a couple months. Shot today      Relevant Orders   Vitamin B12 (Completed)   Muscle cramp    Check magnesium       Relevant Orders   Magnesium (Completed)      I am having Ms. Mccammon maintain her BREO ELLIPTA, PROAIR HFA, isosorbide mononitrate, acetaminophen, furosemide, ipratropium-albuterol, omeprazole, nitroGLYCERIN, aspirin EC, warfarin, hydroxypropyl methylcellulose / hypromellose, amLODipine, ferrous sulfate, potassium chloride, carvedilol, chlorthalidone, lisinopril, atorvastatin, and meloxicam.  Meds ordered this encounter  Medications  . carvedilol (COREG) 6.25 MG tablet    Sig: Take 6.25 mg by mouth 2 (two) times daily with a meal.  . chlorthalidone (HYGROTON) 50 MG tablet    Sig: Take 50 mg by mouth daily.  Marland Kitchen lisinopril (PRINIVIL,ZESTRIL) 40 MG tablet    Sig: Take 40 mg by mouth 2 (two) times daily.  Marland Kitchen atorvastatin (LIPITOR) 40 MG tablet    Sig: Take 40 mg by mouth daily.  . meloxicam (MOBIC) 15 MG tablet    Sig: Take 15 mg by mouth daily as needed for pain.    CMA served as Education administrator during this visit. History, Physical and Plan performed by medical provider. Documentation and orders reviewed and attested to.  Penni Homans, MD

## 2017-06-16 NOTE — Assessment & Plan Note (Signed)
Check magnesium. 

## 2017-06-16 NOTE — Assessment & Plan Note (Addendum)
Check cbc today, hgb continues to improve.

## 2017-06-16 NOTE — Assessment & Plan Note (Signed)
Encouraged heart healthy diet, increase exercise, avoid trans fats, consider a krill oil cap daily 

## 2017-06-16 NOTE — Assessment & Plan Note (Signed)
Tolerating Coumadin, rate controlled 

## 2017-06-16 NOTE — Assessment & Plan Note (Signed)
Check vitamin B 12 today, has not had a vitamin B12 shot in a couple months. Shot today

## 2017-06-16 NOTE — Patient Instructions (Signed)
Anemia, Nonspecific Anemia is a condition in which the concentration of red blood cells or hemoglobin in the blood is below normal. Hemoglobin is a substance in red blood cells that carries oxygen to the tissues of the body. Anemia results in not enough oxygen reaching these tissues. What are the causes? Common causes of anemia include:  Excessive bleeding. Bleeding may be internal or external. This includes excessive bleeding from periods (in women) or from the intestine.  Poor nutrition.  Chronic kidney, thyroid, and liver disease.  Bone marrow disorders that decrease red blood cell production.  Cancer and treatments for cancer.  HIV, AIDS, and their treatments.  Spleen problems that increase red blood cell destruction.  Blood disorders.  Excess destruction of red blood cells due to infection, medicines, and autoimmune disorders. What are the signs or symptoms?  Minor weakness.  Dizziness.  Headache.  Palpitations.  Shortness of breath, especially with exercise.  Paleness.  Cold sensitivity.  Indigestion.  Nausea.  Difficulty sleeping.  Difficulty concentrating. Symptoms may occur suddenly or they may develop slowly. How is this diagnosed? Additional blood tests are often needed. These help your health care provider determine the best treatment. Your health care provider will check your stool for blood and look for other causes of blood loss. How is this treated? Treatment varies depending on the cause of the anemia. Treatment can include:  Supplements of iron, vitamin B12, or folic acid.  Hormone medicines.  A blood transfusion. This may be needed if blood loss is severe.  Hospitalization. This may be needed if there is significant continual blood loss.  Dietary changes.  Spleen removal. Follow these instructions at home: Keep all follow-up appointments. It often takes many weeks to correct anemia, and having your health care provider check on your  condition and your response to treatment is very important. Get help right away if:  You develop extreme weakness, shortness of breath, or chest pain.  You become dizzy or have trouble concentrating.  You develop heavy vaginal bleeding.  You develop a rash.  You have bloody or black, tarry stools.  You faint.  You vomit up blood.  You vomit repeatedly.  You have abdominal pain.  You have a fever or persistent symptoms for more than 2-3 days.  You have a fever and your symptoms suddenly get worse.  You are dehydrated. This information is not intended to replace advice given to you by your health care provider. Make sure you discuss any questions you have with your health care provider. Document Released: 12/09/2004 Document Revised: 04/14/2016 Document Reviewed: 04/27/2013 Elsevier Interactive Patient Education  2017 [employer]  

## 2017-06-19 NOTE — Assessment & Plan Note (Signed)
Well controlled, no changes to meds. Encouraged heart healthy diet such as the DASH diet and exercise as tolerated.  °

## 2017-06-23 DIAGNOSIS — I480 Paroxysmal atrial fibrillation: Secondary | ICD-10-CM | POA: Diagnosis not present

## 2017-06-23 DIAGNOSIS — I369 Nonrheumatic tricuspid valve disorder, unspecified: Secondary | ICD-10-CM | POA: Diagnosis not present

## 2017-06-23 DIAGNOSIS — Z7901 Long term (current) use of anticoagulants: Secondary | ICD-10-CM | POA: Diagnosis not present

## 2017-06-23 DIAGNOSIS — I10 Essential (primary) hypertension: Secondary | ICD-10-CM | POA: Diagnosis not present

## 2017-06-23 DIAGNOSIS — E785 Hyperlipidemia, unspecified: Secondary | ICD-10-CM | POA: Diagnosis not present

## 2017-06-23 DIAGNOSIS — I2510 Atherosclerotic heart disease of native coronary artery without angina pectoris: Secondary | ICD-10-CM | POA: Diagnosis not present

## 2017-06-23 DIAGNOSIS — I059 Rheumatic mitral valve disease, unspecified: Secondary | ICD-10-CM | POA: Diagnosis not present

## 2017-06-28 DIAGNOSIS — Z9181 History of falling: Secondary | ICD-10-CM | POA: Diagnosis not present

## 2017-06-28 DIAGNOSIS — I4891 Unspecified atrial fibrillation: Secondary | ICD-10-CM | POA: Diagnosis not present

## 2017-06-28 DIAGNOSIS — J9611 Chronic respiratory failure with hypoxia: Secondary | ICD-10-CM | POA: Diagnosis not present

## 2017-06-28 DIAGNOSIS — J449 Chronic obstructive pulmonary disease, unspecified: Secondary | ICD-10-CM | POA: Diagnosis not present

## 2017-07-07 DIAGNOSIS — Z7901 Long term (current) use of anticoagulants: Secondary | ICD-10-CM | POA: Diagnosis not present

## 2017-07-07 DIAGNOSIS — I480 Paroxysmal atrial fibrillation: Secondary | ICD-10-CM | POA: Diagnosis not present

## 2017-07-14 ENCOUNTER — Ambulatory Visit (INDEPENDENT_AMBULATORY_CARE_PROVIDER_SITE_OTHER): Payer: Medicare Other | Admitting: Behavioral Health

## 2017-07-14 DIAGNOSIS — E538 Deficiency of other specified B group vitamins: Secondary | ICD-10-CM | POA: Diagnosis not present

## 2017-07-14 MED ORDER — CYANOCOBALAMIN 1000 MCG/ML IJ SOLN
1000.0000 ug | Freq: Once | INTRAMUSCULAR | Status: AC
  Administered 2017-07-14: 1000 ug via INTRAMUSCULAR

## 2017-07-14 NOTE — Progress Notes (Signed)
Pre visit review using our clinic review tool, if applicable. No additional management support is needed unless otherwise documented below in the visit note.  Patient came in office for monthly B12 injection. IM injection was given in the right deltoid. Patient tolerated it well. Next appointment 08/18/17 at 9:00 AM.

## 2017-07-18 IMAGING — DX DG CHEST 2V
2 series · 2 of 2 positions shown · non-contrast
Comparison: April 07, 2015.

CLINICAL DATA: Acute chest pain.

EXAM:
CHEST  2 VIEW

[chest pa]
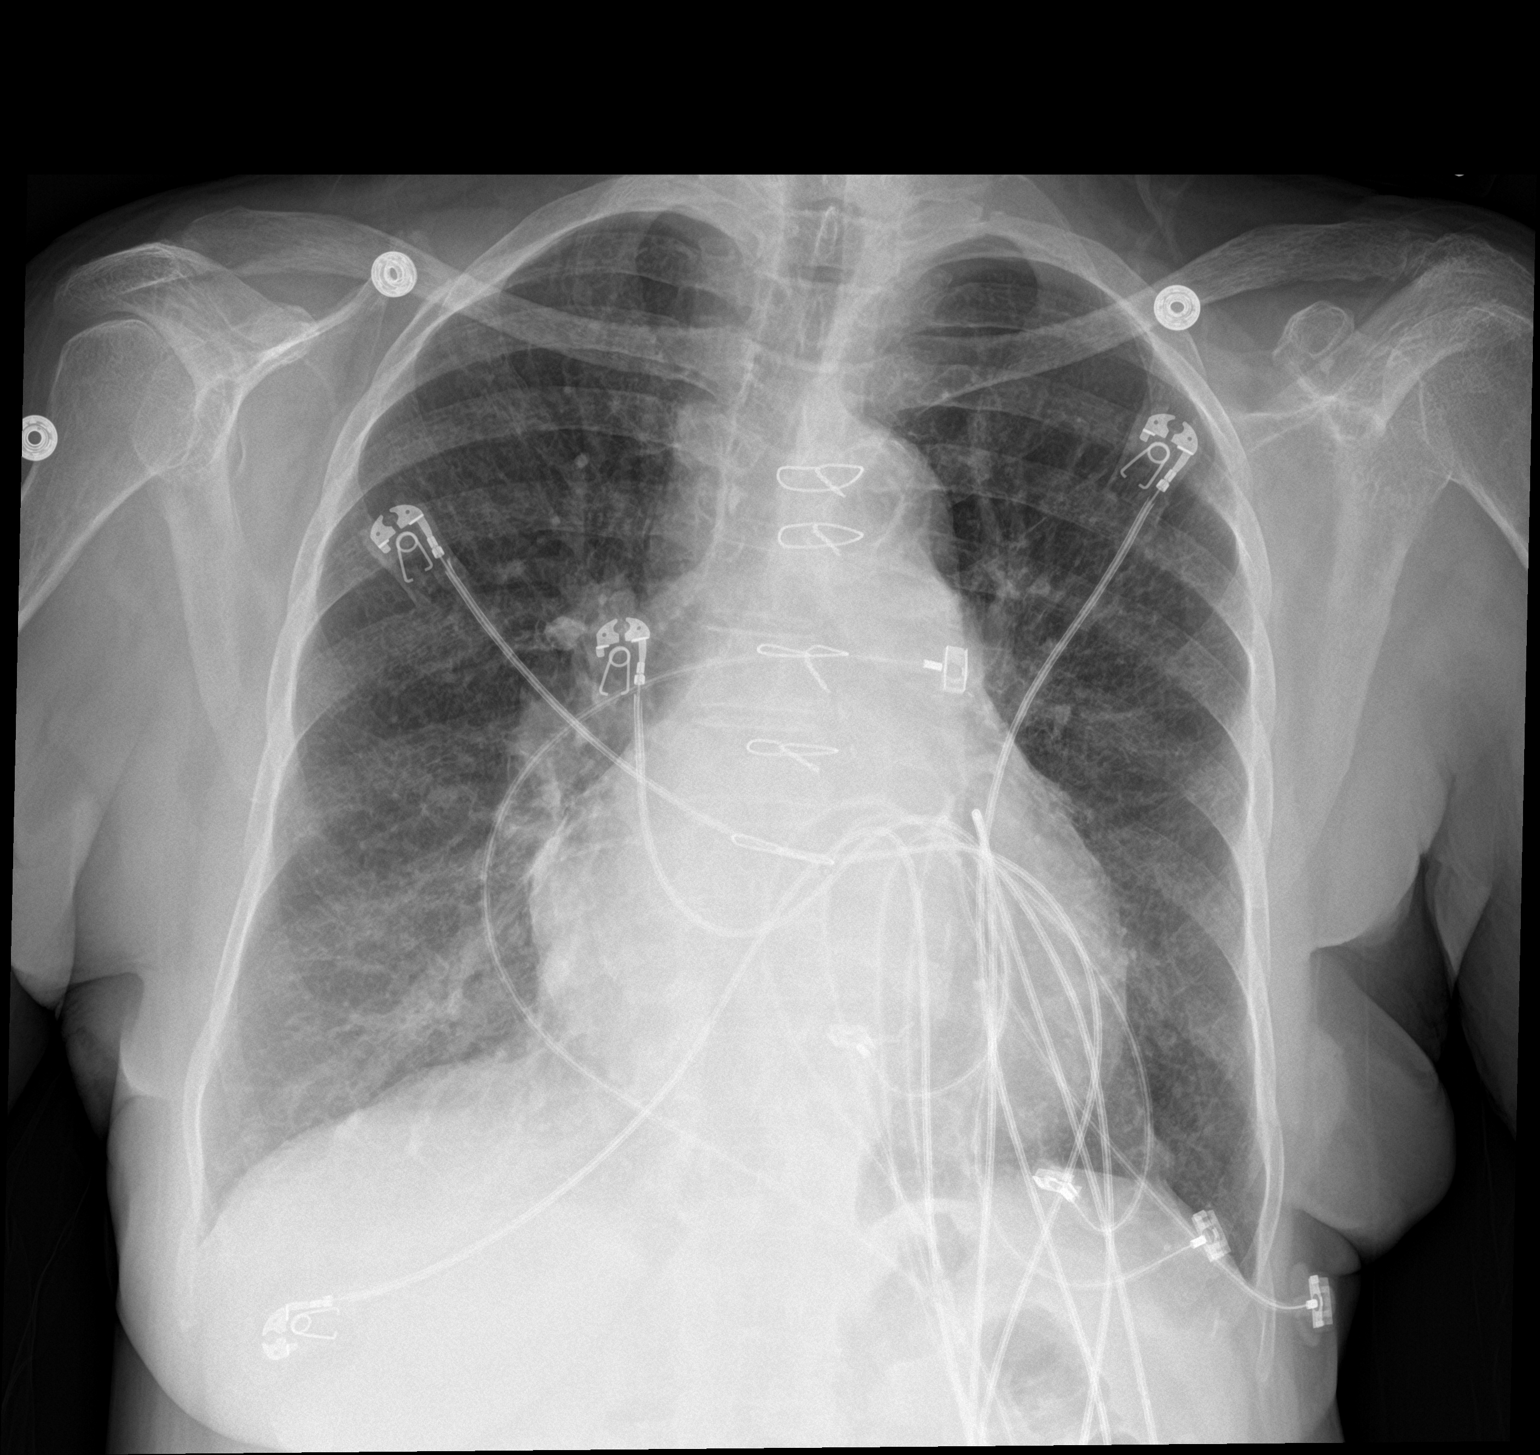

[chest lat]
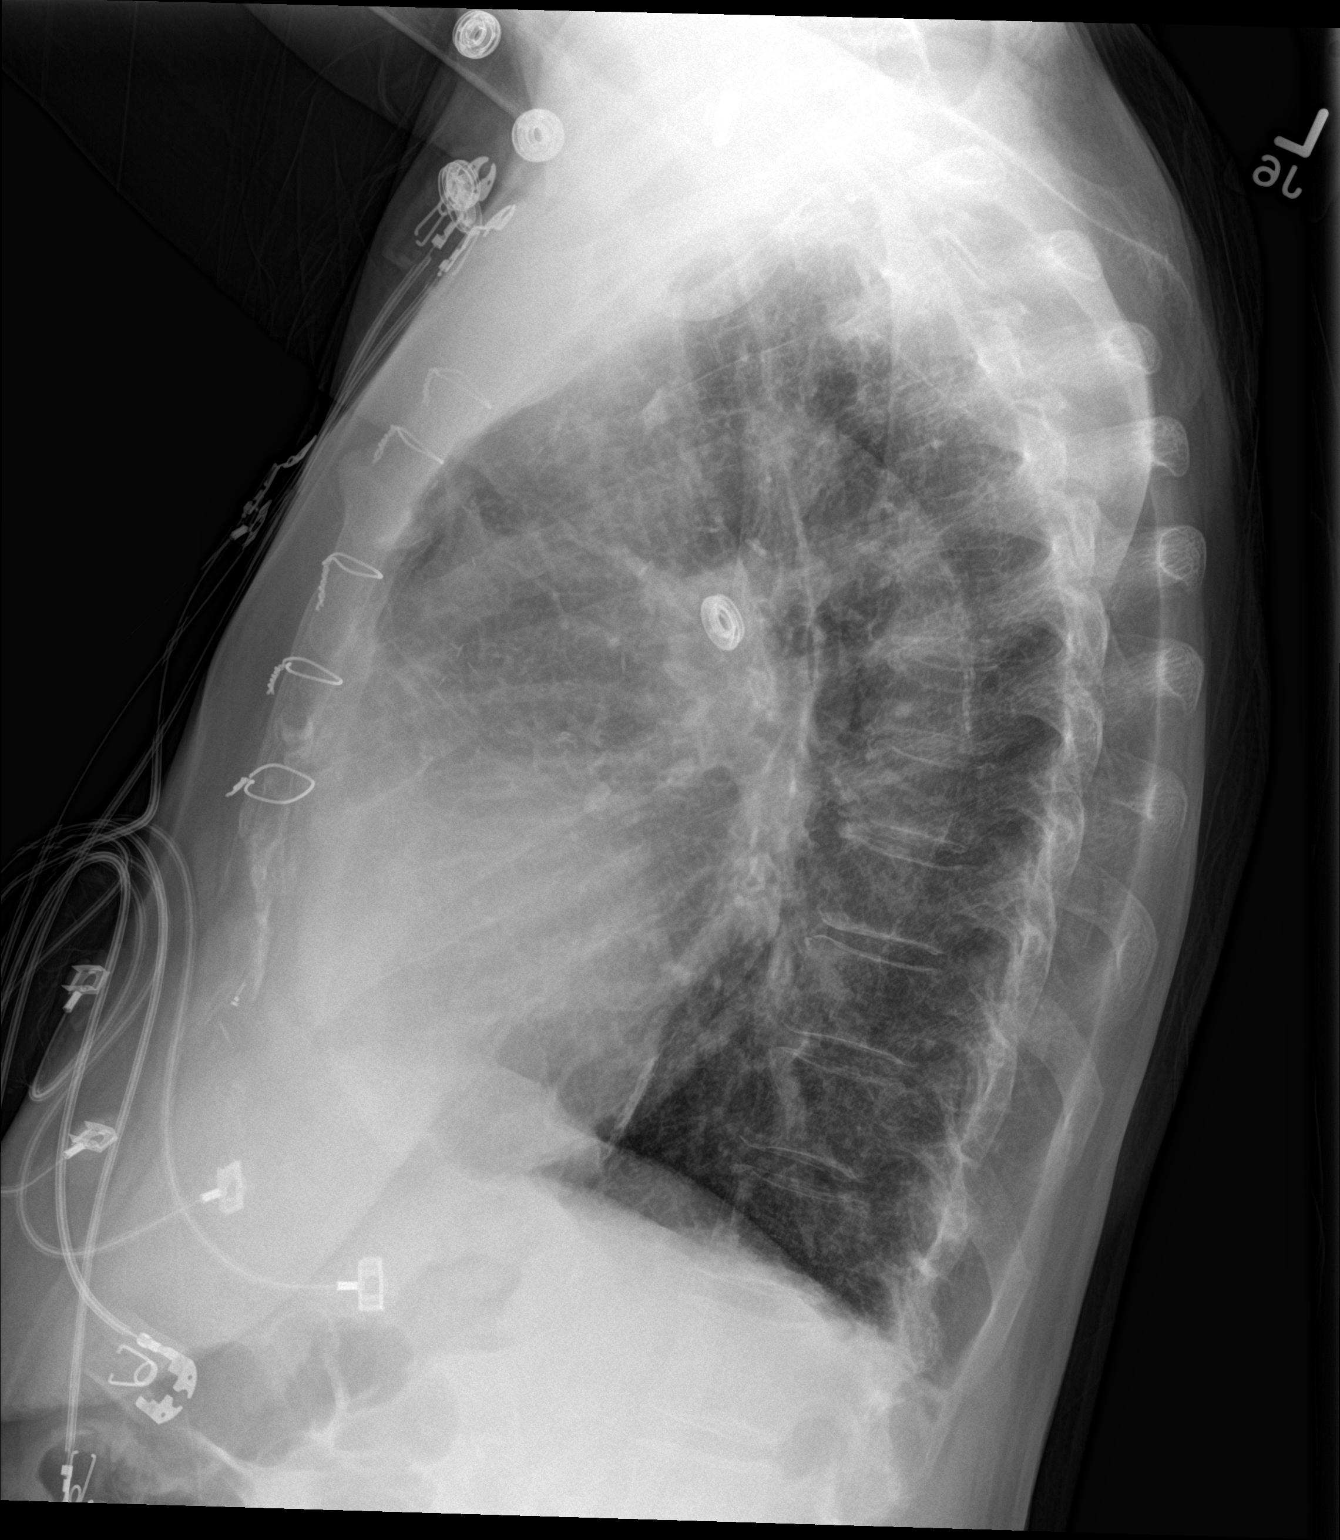

[2 of 2 positions shown; findings below may reference images not displayed]

FINDINGS: Stable cardiomegaly. Sternotomy wires are noted. No pneumothorax or
pleural effusion is noted. Atherosclerosis of thoracic aorta is
noted. No acute pulmonary disease is noted. Bony thorax is
unremarkable.
IMPRESSION: No active cardiopulmonary disease.

## 2017-07-21 DIAGNOSIS — Z7901 Long term (current) use of anticoagulants: Secondary | ICD-10-CM | POA: Diagnosis not present

## 2017-07-21 DIAGNOSIS — I10 Essential (primary) hypertension: Secondary | ICD-10-CM | POA: Diagnosis not present

## 2017-07-21 DIAGNOSIS — I2510 Atherosclerotic heart disease of native coronary artery without angina pectoris: Secondary | ICD-10-CM | POA: Diagnosis not present

## 2017-07-21 DIAGNOSIS — I059 Rheumatic mitral valve disease, unspecified: Secondary | ICD-10-CM | POA: Diagnosis not present

## 2017-07-21 DIAGNOSIS — I369 Nonrheumatic tricuspid valve disorder, unspecified: Secondary | ICD-10-CM | POA: Diagnosis not present

## 2017-07-21 DIAGNOSIS — E785 Hyperlipidemia, unspecified: Secondary | ICD-10-CM | POA: Diagnosis not present

## 2017-07-21 DIAGNOSIS — I480 Paroxysmal atrial fibrillation: Secondary | ICD-10-CM | POA: Diagnosis not present

## 2017-08-18 ENCOUNTER — Ambulatory Visit (INDEPENDENT_AMBULATORY_CARE_PROVIDER_SITE_OTHER): Payer: Medicare Other | Admitting: Family Medicine

## 2017-08-18 ENCOUNTER — Encounter: Payer: Self-pay | Admitting: Family Medicine

## 2017-08-18 VITALS — BP 128/46 | HR 47 | Temp 97.5°F | Resp 18 | Wt 124.8 lb

## 2017-08-18 DIAGNOSIS — I25119 Atherosclerotic heart disease of native coronary artery with unspecified angina pectoris: Secondary | ICD-10-CM

## 2017-08-18 DIAGNOSIS — Z23 Encounter for immunization: Secondary | ICD-10-CM

## 2017-08-18 DIAGNOSIS — E785 Hyperlipidemia, unspecified: Secondary | ICD-10-CM

## 2017-08-18 DIAGNOSIS — I10 Essential (primary) hypertension: Secondary | ICD-10-CM

## 2017-08-18 DIAGNOSIS — D508 Other iron deficiency anemias: Secondary | ICD-10-CM | POA: Diagnosis not present

## 2017-08-18 DIAGNOSIS — E538 Deficiency of other specified B group vitamins: Secondary | ICD-10-CM | POA: Diagnosis not present

## 2017-08-18 DIAGNOSIS — J439 Emphysema, unspecified: Secondary | ICD-10-CM

## 2017-08-18 MED ORDER — CYANOCOBALAMIN 1000 MCG/ML IJ SOLN
1000.0000 ug | Freq: Once | INTRAMUSCULAR | Status: AC
  Administered 2017-08-18: 1000 ug via INTRAMUSCULAR

## 2017-08-18 NOTE — Progress Notes (Signed)
Subjective:  I acted as a Education administrator for Dr. Charlett Blake. Yesenia Woods, Yesenia Woods  Patient ID: Yesenia Woods, female    DOB: 1934-11-26, 82 y.o.   MRN: [id number]  No chief complaint on file.   HPI  Patient is in today for a 4 week follow up. She is following up on her HTN, copd, anemia and more. No serious falls since her last visit. No bloody or tarry stool. No recent ferile illnesss or hospitalizations. Denies CP/palp/HA/congestion/fevers/GI or GU c/o. Taking meds as prescribed Patient Care Team: Mosie Lukes, MD as PCP - General (Family Medicine) Delanna Notice, MD as Referring Physician (Internal Medicine) Vicente Males, MD (Pulmonary Disease)   Past Medical History:  Diagnosis Date  . Anemia   . Arthritis   . Atrial fibrillation ([place])   . B12 deficiency 04/10/2015  . Breast cancer ([place]) 12/09/2014   Mastectomy on left, chemo x 3 doses  . CAD (coronary artery disease)    Status post LIMA to LAD at [facility] 04/1989  . Chronic respiratory failure with hypoxia ([place])   . COPD (chronic obstructive pulmonary disease) ([place])   . Diverticulitis   . Duodenal ulcer   . Essential hypertension   . Falls 01/18/2016  . Headache(784.0)   . Hyperlipidemia   . Left hand pain 12/13/2016  . Oxygen dependent   . Small bowel arteriovenous malformation     Past Surgical History:  Procedure Laterality Date  . COLONOSCOPY  07/22/2011   Procedure: COLONOSCOPY;  Surgeon: Rogene Houston, MD;  Location: [facility];  Service: Endoscopy;  Laterality: N/A;  . CORONARY ARTERY BYPASS GRAFT  1990   [facility] - LIMA to LAD  . ESOPHAGOGASTRODUODENOSCOPY  07/22/2011   Procedure: ESOPHAGOGASTRODUODENOSCOPY (EGD);  Surgeon: Rogene Houston, MD;  [facility]: [facility];  Service: Endoscopy;  Laterality: N/A;  . ESOPHAGOGASTRODUODENOSCOPY N/A 04/23/2013   Procedure: ESOPHAGOGASTRODUODENOSCOPY (EGD);  Surgeon: Rogene Houston, MD;  Location: [facility];  Service: Endoscopy;  Laterality: N/A;  . EYE SURGERY    . GIVENS CAPSULE  STUDY  07/30/2011   Procedure: GIVENS CAPSULE STUDY;  Surgeon: Rogene Houston, MD;  Location: [facility];  Service: Endoscopy;  Laterality: N/A;  7:30  . Hand surgery Right   . MASTECTOMY Left     Family History  Problem Relation Age of Onset  . Prostate cancer Brother   . Prostate cancer Brother   . Breast cancer Daughter        Breast Cancer that mets  . Throat cancer Daughter   . Healthy Daughter   . Healthy Son   . Diabetes Son   . Hypertension Son   . Healthy Son   . Healthy Son   . Healthy Son   . Heart disease Son        Cardiac arrythmia, with defibrillator  . Healthy Son   . Prostate cancer Son   . CAD Son     Social History   Social History  . Marital status: Widowed    Spouse name: N/A  . Number of children: N/A  . Years of education: N/A   Occupational History  . Not on file.   Social History Main Topics  . Smoking status: Former Smoker    Packs/day: 1.00    Years: 30.00    Types: Cigarettes    Quit date: 10/14/1994  . Smokeless tobacco: Never Used     Comment: Patient quit smoking 25 years ago  . Alcohol use No  Comment: none for 8 months, wine occasionally  . Drug use: No  . 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    Other Topics Concern  . Not on file   Social History Narrative  . No narrative on file    Outpatient Medications Prior to Visit  Medication Sig Dispense Refill  . acetaminophen (TYLENOL) 500 MG tablet Take 1,000 mg by mouth daily as needed for moderate pain.    Marland Kitchen amLODipine (NORVASC) 5 MG tablet Take 1 tablet (5 mg total) by mouth daily. 30 tablet 1  . aspirin EC 81 MG tablet Take 81 mg by mouth daily.    Marland Kitchen atorvastatin (LIPITOR) 40 MG tablet Take 40 mg by mouth daily.    Marland Kitchen BREO ELLIPTA 100-25 MCG/INH AEPB Inhale 1 puff into the lungs daily.     . carvedilol (COREG) 6.25 MG tablet Take 6.25 mg by mouth 2 (two) times daily with a meal.    . chlorthalidone  (HYGROTON) 50 MG tablet Take 50 mg by mouth daily.    . ferrous sulfate 325 (65 FE) MG tablet Take 1 tablet (325 mg total) by mouth 2 (two) times daily with a meal. (Patient taking differently: Take 325 mg by mouth daily with breakfast. ) 30 tablet 3  . furosemide (LASIX) 40 MG tablet Take 1 tablet (40 mg total) by mouth daily as needed for fluid or edema. 30 tablet 0  . hydroxypropyl methylcellulose / hypromellose (ISOPTO TEARS / GONIOVISC) 2.5 % ophthalmic solution Place 1 drop into both eyes daily as needed for dry eyes.    Marland Kitchen ipratropium-albuterol (DUONEB) 0.5-2.5 (3) MG/3ML SOLN Take 3 mLs by nebulization every 6 (six) hours as needed (shortness of breath).     . isosorbide mononitrate (IMDUR) 30 MG 24 hr tablet Take 30 mg by mouth 2 (two) times daily.     . nitroGLYCERIN (NITROSTAT) 0.4 MG SL tablet Place 1 tablet (0.4 mg total) under the tongue every 5 (five) minutes as needed for chest pain. If no relief after 3 doses to ER 25 tablet 3  . omeprazole (PRILOSEC) 20 MG capsule Take 1 capsule by mouth daily.    . potassium chloride (K-DUR) 10 MEQ tablet Take 1 tablet (10 mEq total) by mouth daily. 30 tablet 0  . PROAIR HFA 108 (90 BASE) MCG/ACT inhaler Inhale 2 puffs into the lungs every 6 (six) hours as needed for wheezing or shortness of breath.     . warfarin (COUMADIN) 7.5 MG tablet Take 7.5 mg by mouth daily. Patient take 7.5mg  on Monday only and 4mg  all other days    . lisinopril (PRINIVIL,ZESTRIL) 40 MG tablet Take 40 mg by mouth 2 (two) times daily.    . meloxicam (MOBIC) 15 MG tablet Take 15 mg by mouth daily as needed for pain.     No facility-administered medications prior to visit.     No Known Allergies  Review of Systems  Constitutional: Positive for malaise/fatigue. Negative for fever.  HENT: Negative for congestion.   Eyes: Negative for blurred vision.  Respiratory: Positive for shortness of breath.   Cardiovascular: Negative for chest pain, palpitations and leg swelling.    Gastrointestinal: Negative for abdominal pain, blood in stool and nausea.  Genitourinary: Negative for dysuria and frequency.  Musculoskeletal: Negative for falls.  Skin: Negative for rash.  Neurological: Negative for dizziness, loss of consciousness and headaches.  Endo/Heme/Allergies: Negative for environmental allergies.  Psychiatric/Behavioral: Negative for  depression. The patient is not nervous/anxious.        Objective:    Physical Exam  Constitutional: She is oriented to person, place, and time. She appears well-developed and well-nourished. No distress.  HENT:  Head: Normocephalic and atraumatic.  Nose: Nose normal.  Eyes: Right eye exhibits no discharge. Left eye exhibits no discharge.  Neck: Normal range of motion. Neck supple.  Cardiovascular: Normal rate and regular rhythm.   Murmur heard. Pulmonary/Chest: Effort normal and breath sounds normal.  Abdominal: Soft. Bowel sounds are normal. There is no tenderness.  Musculoskeletal: She exhibits no edema.  Neurological: She is alert and oriented to person, place, and time.  Skin: Skin is warm and dry.  Psychiatric: She has a normal mood and affect.  Nursing note and vitals reviewed.   BP (!) 128/46 (BP Location: Left Arm, Patient Position: Sitting, Cuff Size: Normal)   Pulse (!) 47   Temp (!) 97.5 F (36.4 C) (Oral)   Resp 18   Wt 124 lb 12.8 oz (56.6 kg)   SpO2 93%   BMI (P) 24.37 kg/m  Wt Readings from Last 3 Encounters:  08/18/17 124 lb 12.8 oz (56.6 kg)  06/16/17 124 lb 9.6 oz (56.5 kg)  03/11/17 130 lb 12.8 oz (59.3 kg)   BP Readings from Last 3 Encounters:  08/18/17 (!) 128/46  06/16/17 136/62  03/13/17 134/86     Immunization History  Administered Date(s) Administered  . Influenza, High Dose Seasonal PF 09/02/2016  . Influenza,inj,Quad PF,6+ Mos 10/16/2014, 08/29/2015  . Pneumococcal-Unspecified 11/16/2011  . Td 07/05/2016    Health Maintenance  Topic Date Due  . DEXA SCAN  01/22/2002  .  PNA vac Low Risk Adult (2 of 2 - PCV13) 11/15/2012  . INFLUENZA VACCINE  06/15/2017  . TETANUS/TDAP  07/05/2026    Lab Results  Component Value Date   WBC 7.9 06/16/2017   HGB 11.9 (L) 06/16/2017   HCT 38.1 06/16/2017   PLT 315.0 06/16/2017   GLUCOSE 89 06/16/2017   CHOL 114 06/16/2017   TRIG 82.0 06/16/2017   HDL 45.30 06/16/2017   LDLCALC 52 06/16/2017   ALT 11 06/16/2017   AST 14 06/16/2017   NA 140 06/16/2017   K 3.6 06/16/2017   CL 105 06/16/2017   CREATININE 0.82 06/16/2017   BUN 24 (H) 06/16/2017   CO2 29 06/16/2017   TSH 1.28 06/16/2017   INR 2.41 (H) 06/04/2015   HGBA1C 6.2 (H) 07/19/2011    Lab Results  Component Value Date   TSH 1.28 06/16/2017   Lab Results  Component Value Date   WBC 7.9 06/16/2017   HGB 11.9 (L) 06/16/2017   HCT 38.1 06/16/2017   MCV 72.5 (L) 06/16/2017   PLT 315.0 06/16/2017   Lab Results  Component Value Date   NA 140 06/16/2017   K 3.6 06/16/2017   CO2 29 06/16/2017   GLUCOSE 89 06/16/2017   BUN 24 (H) 06/16/2017   CREATININE 0.82 06/16/2017   BILITOT 0.4 06/16/2017   ALKPHOS 84 06/16/2017   AST 14 06/16/2017   ALT 11 06/16/2017   PROT 6.9 06/16/2017   ALBUMIN 3.9 06/16/2017   CALCIUM 9.9 06/16/2017   ANIONGAP 8 03/10/2016   GFR 86.18 06/16/2017   Lab Results  Component Value Date   CHOL 114 06/16/2017   Lab Results  Component Value Date   HDL 45.30 06/16/2017   Lab Results  Component Value Date   LDLCALC 52 06/16/2017   Lab Results  Component Value Date   TRIG 82.0 06/16/2017   Lab Results  Component Value Date   CHOLHDL 3 06/16/2017   Lab Results  Component Value Date   HGBA1C 6.2 (H) 07/19/2011         Assessment & Plan:   Problem List Items Addressed This Visit    CAD (coronary artery disease) (Chronic)    Following with cardiology in [place], no recent change in therapy      HTN (hypertension) (Chronic)    Well controlled, no changes to meds. Encouraged heart healthy diet such as the  DASH diet and exercise as tolerated.       Iron deficiency anemia    Improving. Continue to monitor      Relevant Medications   cyanocobalamin ((VITAMIN B-12)) injection 1,000 mcg (Completed)   Hyperlipidemia    Encouraged heart healthy diet, increase exercise, avoid trans fats, consider a krill oil cap daily      COPD (chronic obstructive pulmonary disease) (HCC)    Stable on current meds. Following [facility] pulmonology.      B12 deficiency    Numbers high on last check but has trouble with transportation here so has her level checked after her shot while she is here. Will not change dosing, shot given today.      Relevant Medications   cyanocobalamin ((VITAMIN B-12)) injection 1,000 mcg (Completed)    Other Visit Diagnoses    Needs flu shot    -  Primary   Relevant Orders   Flu vaccine HIGH DOSE PF (Fluzone High dose)      I have discontinued Ms. Hoefling's lisinopril and meloxicam. I am also having her maintain her BREO ELLIPTA, PROAIR HFA, isosorbide mononitrate, acetaminophen, furosemide, ipratropium-albuterol, omeprazole, nitroGLYCERIN, aspirin EC, warfarin, hydroxypropyl methylcellulose / hypromellose, amLODipine, ferrous sulfate, potassium chloride, carvedilol, chlorthalidone, and atorvastatin. We administered cyanocobalamin.  Meds ordered this encounter  Medications  . cyanocobalamin ((VITAMIN B-12)) injection 1,000 mcg    CMA served as scribe during this visit. History, Physical and Plan performed by medical provider. Documentation and orders reviewed and attested to.  Penni Homans, MD

## 2017-08-18 NOTE — Patient Instructions (Addendum)
STOP Lisinopril to see if that helps the cough.   Try taking a puff of the Albuterol when you coughing  Elderberry liquid Robitussin cough syrup  Cough, Adult Coughing is a reflex that clears your throat and your airways. Coughing helps to heal and protect your lungs. It is normal to cough occasionally, but a cough that happens with other symptoms or lasts a long time may be a sign of a condition that needs treatment. A cough may last only 2-3 weeks (acute), or it may last longer than 8 weeks (chronic). What are the causes? Coughing is commonly caused by:  Breathing in substances that irritate your lungs.  A viral or bacterial respiratory infection.  Allergies.  Asthma.  Postnasal drip.  Smoking.  Acid backing up from the stomach into the esophagus (gastroesophageal reflux).  Certain medicines.  Chronic lung problems, including COPD (or rarely, lung cancer).  Other medical conditions such as heart failure.  Follow these instructions at home: Pay attention to any changes in your symptoms. Take these actions to help with your discomfort:  Take medicines only as told by your health care provider. ? If you were prescribed an antibiotic medicine, take it as told by your health care provider. Do not stop taking the antibiotic even if you start to feel better. ? Talk with your health care provider before you take a cough suppressant medicine.  Drink enough fluid to keep your urine clear or pale yellow.  If the air is dry, use a cold steam vaporizer or humidifier in your bedroom or your home to help loosen secretions.  Avoid anything that causes you to cough at work or at home.  If your cough is worse at night, try sleeping in a semi-upright position.  Avoid cigarette smoke. If you smoke, quit smoking. If you need help quitting, ask your health care provider.  Avoid caffeine.  Avoid alcohol.  Rest as needed.  Contact a health care provider if:  You have new  symptoms.  You cough up pus.  Your cough does not get better after 2-3 weeks, or your cough gets worse.  You cannot control your cough with suppressant medicines and you are losing sleep.  You develop pain that is getting worse or pain that is not controlled with pain medicines.  You have a fever.  You have unexplained weight loss.  You have night sweats. Get help right away if:  You cough up blood.  You have difficulty breathing.  Your heartbeat is very fast. This information is not intended to replace advice given to you by your health care provider. Make sure you discuss any questions you have with your health care provider. Document Released: 04/30/2011 Document Revised: 04/08/2016 Document Reviewed: 01/08/2015 Elsevier Interactive Patient Education  2017 [employer].

## 2017-08-21 NOTE — Assessment & Plan Note (Signed)
Well controlled, no changes to meds. Encouraged heart healthy diet such as the DASH diet and exercise as tolerated.  °

## 2017-08-21 NOTE — Assessment & Plan Note (Signed)
Improving  Continue to monitor 

## 2017-08-21 NOTE — Assessment & Plan Note (Signed)
Stable on current meds. Following wieht pulmonology.

## 2017-08-21 NOTE — Assessment & Plan Note (Signed)
Following with cardiology in [place], no recent change in therapy

## 2017-08-21 NOTE — Assessment & Plan Note (Signed)
Numbers high on last check but has trouble with transportation here so has her level checked after her shot while she is here. Will not change dosing, shot given today.

## 2017-08-21 NOTE — Assessment & Plan Note (Signed)
Encouraged heart healthy diet, increase exercise, avoid trans fats, consider a krill oil cap daily 

## 2017-08-22 DIAGNOSIS — I369 Nonrheumatic tricuspid valve disorder, unspecified: Secondary | ICD-10-CM | POA: Diagnosis not present

## 2017-08-22 DIAGNOSIS — Z7901 Long term (current) use of anticoagulants: Secondary | ICD-10-CM | POA: Diagnosis not present

## 2017-08-22 DIAGNOSIS — I059 Rheumatic mitral valve disease, unspecified: Secondary | ICD-10-CM | POA: Diagnosis not present

## 2017-08-22 DIAGNOSIS — I495 Sick sinus syndrome: Secondary | ICD-10-CM | POA: Diagnosis not present

## 2017-08-22 DIAGNOSIS — E785 Hyperlipidemia, unspecified: Secondary | ICD-10-CM | POA: Diagnosis not present

## 2017-08-22 DIAGNOSIS — I480 Paroxysmal atrial fibrillation: Secondary | ICD-10-CM | POA: Diagnosis not present

## 2017-08-22 DIAGNOSIS — I10 Essential (primary) hypertension: Secondary | ICD-10-CM | POA: Diagnosis not present

## 2017-08-22 DIAGNOSIS — I2510 Atherosclerotic heart disease of native coronary artery without angina pectoris: Secondary | ICD-10-CM | POA: Diagnosis not present

## 2017-09-05 DIAGNOSIS — I480 Paroxysmal atrial fibrillation: Secondary | ICD-10-CM | POA: Diagnosis not present

## 2017-09-05 DIAGNOSIS — I059 Rheumatic mitral valve disease, unspecified: Secondary | ICD-10-CM | POA: Diagnosis not present

## 2017-09-05 DIAGNOSIS — I369 Nonrheumatic tricuspid valve disorder, unspecified: Secondary | ICD-10-CM | POA: Diagnosis not present

## 2017-09-05 DIAGNOSIS — I10 Essential (primary) hypertension: Secondary | ICD-10-CM | POA: Diagnosis not present

## 2017-09-05 DIAGNOSIS — E785 Hyperlipidemia, unspecified: Secondary | ICD-10-CM | POA: Diagnosis not present

## 2017-09-05 DIAGNOSIS — Z7901 Long term (current) use of anticoagulants: Secondary | ICD-10-CM | POA: Diagnosis not present

## 2017-09-05 DIAGNOSIS — I2510 Atherosclerotic heart disease of native coronary artery without angina pectoris: Secondary | ICD-10-CM | POA: Diagnosis not present

## 2017-09-05 DIAGNOSIS — I495 Sick sinus syndrome: Secondary | ICD-10-CM | POA: Diagnosis not present

## 2017-09-22 DIAGNOSIS — Z7901 Long term (current) use of anticoagulants: Secondary | ICD-10-CM | POA: Diagnosis not present

## 2017-09-22 DIAGNOSIS — I369 Nonrheumatic tricuspid valve disorder, unspecified: Secondary | ICD-10-CM | POA: Diagnosis not present

## 2017-09-22 DIAGNOSIS — E785 Hyperlipidemia, unspecified: Secondary | ICD-10-CM | POA: Diagnosis not present

## 2017-09-22 DIAGNOSIS — I059 Rheumatic mitral valve disease, unspecified: Secondary | ICD-10-CM | POA: Diagnosis not present

## 2017-09-22 DIAGNOSIS — I480 Paroxysmal atrial fibrillation: Secondary | ICD-10-CM | POA: Diagnosis not present

## 2017-09-22 DIAGNOSIS — I10 Essential (primary) hypertension: Secondary | ICD-10-CM | POA: Diagnosis not present

## 2017-09-22 DIAGNOSIS — I495 Sick sinus syndrome: Secondary | ICD-10-CM | POA: Diagnosis not present

## 2017-09-22 DIAGNOSIS — I2510 Atherosclerotic heart disease of native coronary artery without angina pectoris: Secondary | ICD-10-CM | POA: Diagnosis not present

## 2017-09-28 DIAGNOSIS — I10 Essential (primary) hypertension: Secondary | ICD-10-CM | POA: Diagnosis not present

## 2017-09-28 DIAGNOSIS — I4891 Unspecified atrial fibrillation: Secondary | ICD-10-CM | POA: Diagnosis not present

## 2017-09-28 DIAGNOSIS — J449 Chronic obstructive pulmonary disease, unspecified: Secondary | ICD-10-CM | POA: Diagnosis not present

## 2017-09-28 DIAGNOSIS — J9611 Chronic respiratory failure with hypoxia: Secondary | ICD-10-CM | POA: Diagnosis not present

## 2017-10-10 ENCOUNTER — Encounter: Payer: Self-pay | Admitting: Family Medicine

## 2017-10-10 ENCOUNTER — Ambulatory Visit (INDEPENDENT_AMBULATORY_CARE_PROVIDER_SITE_OTHER): Payer: Medicare Other | Admitting: Family Medicine

## 2017-10-10 VITALS — BP 130/66 | HR 97 | Temp 97.7°F | Resp 18 | Wt 123.0 lb

## 2017-10-10 DIAGNOSIS — D508 Other iron deficiency anemias: Secondary | ICD-10-CM | POA: Diagnosis not present

## 2017-10-10 DIAGNOSIS — K219 Gastro-esophageal reflux disease without esophagitis: Secondary | ICD-10-CM

## 2017-10-10 DIAGNOSIS — K5900 Constipation, unspecified: Secondary | ICD-10-CM | POA: Diagnosis not present

## 2017-10-10 DIAGNOSIS — R001 Bradycardia, unspecified: Secondary | ICD-10-CM

## 2017-10-10 DIAGNOSIS — I25119 Atherosclerotic heart disease of native coronary artery with unspecified angina pectoris: Secondary | ICD-10-CM | POA: Diagnosis not present

## 2017-10-10 DIAGNOSIS — E538 Deficiency of other specified B group vitamins: Secondary | ICD-10-CM | POA: Diagnosis not present

## 2017-10-10 DIAGNOSIS — I4891 Unspecified atrial fibrillation: Secondary | ICD-10-CM | POA: Diagnosis not present

## 2017-10-10 DIAGNOSIS — I10 Essential (primary) hypertension: Secondary | ICD-10-CM | POA: Diagnosis not present

## 2017-10-10 DIAGNOSIS — E785 Hyperlipidemia, unspecified: Secondary | ICD-10-CM | POA: Diagnosis not present

## 2017-10-10 DIAGNOSIS — R0902 Hypoxemia: Secondary | ICD-10-CM

## 2017-10-10 LAB — COMPREHENSIVE METABOLIC PANEL
ALK PHOS: 90 U/L (ref 39–117)
ALT: 11 U/L (ref 0–35)
AST: 17 U/L (ref 0–37)
Albumin: 4.2 g/dL (ref 3.5–5.2)
BUN: 10 mg/dL (ref 6–23)
CO2: 28 meq/L (ref 19–32)
Calcium: 10.2 mg/dL (ref 8.4–10.5)
Chloride: 98 mEq/L (ref 96–112)
Creatinine, Ser: 0.61 mg/dL (ref 0.40–1.20)
GFR: 121.15 mL/min (ref >60.00)
GLUCOSE: 98 mg/dL (ref 70–99)
POTASSIUM: 3 meq/L — AB (ref 3.5–5.1)
Sodium: 136 mEq/L (ref 135–145)
TOTAL PROTEIN: 7.2 g/dL (ref 6.0–8.3)
Total Bilirubin: 0.9 mg/dL (ref 0.2–1.2)

## 2017-10-10 LAB — TSH: TSH: 1.6 u[IU]/mL (ref 0.35–4.50)

## 2017-10-10 LAB — CBC
HCT: 40.1 % (ref 36.0–46.0)
Hemoglobin: 12.9 g/dL (ref 12.0–15.0)
MCHC: 32.2 g/dL (ref 30.0–36.0)
MCV: 77.2 fl — ABNORMAL LOW (ref 78.0–100.0)
Platelets: 306 10*3/uL (ref 150.0–400.0)
RBC: 5.2 Mil/uL — ABNORMAL HIGH (ref 3.87–5.11)
RDW: 20 % — ABNORMAL HIGH (ref 11.5–15.5)
WBC: 7.5 10*3/uL (ref 4.0–10.5)

## 2017-10-10 LAB — LIPID PANEL
CHOL/HDL RATIO: 2
Cholesterol: 170 mg/dL (ref 0–200)
HDL: 84.8 mg/dL (ref >39.00)
LDL CALC: 72 mg/dL (ref 0–99)
NONHDL: 85.13
Triglycerides: 65 mg/dL (ref 0.0–149.0)
VLDL: 13 mg/dL (ref 0.0–40.0)

## 2017-10-10 LAB — VITAMIN B12: Vitamin B-12: >1500 pg/mL — ABNORMAL HIGH (ref 211–911)

## 2017-10-10 MED ORDER — CYANOCOBALAMIN 1000 MCG/ML IJ SOLN
1000.0000 ug | Freq: Once | INTRAMUSCULAR | Status: AC
  Administered 2017-10-10: 1000 ug via INTRAMUSCULAR

## 2017-10-10 NOTE — Assessment & Plan Note (Signed)
Increase leafy greens, consider increased lean red meat and using cast iron cookware. Continue to monitor, report any concerns. Check CBC

## 2017-10-10 NOTE — Progress Notes (Signed)
Subjective:  I acted as a Education administrator for Dr. Charlett Blake. [place], [place]  Patient ID: Yesenia Woods, female    DOB: 03-16-1935, 82 y.o.   MRN: [id number]  No chief complaint on file.   HPI  Patient is in today for a 6 week follow up on medical concerns including hypoxia, anemia, A fib, CAD, HTN and COPD. She has been following closely with her cardiologist after a fall secondary to a significant bradycardia, she had a very bad nose bleed again that caused her to go to ER. Denies CP/palp/SOB/HA/congestion/fevers or GU c/o. Taking meds as prescribed. No bloody noses since getting home. Has some constipation runs 3 to7 days between movements. No pain or blood associated. Does not take anything.   Patient Care Team: Mosie Lukes, MD as PCP - General (Family Medicine) Delanna Notice, MD as Referring Physician (Internal Medicine) Vicente Males, MD (Pulmonary Disease)   Past Medical History:  Diagnosis Date  . Anemia   . Arthritis   . Atrial fibrillation ([place])   . B12 deficiency 04/10/2015  . Breast cancer ([place]) 12/09/2014   Mastectomy on left, chemo x 3 doses  . CAD (coronary artery disease)    Status post LIMA to LAD at [facility] 04/1989  . Chronic respiratory failure with hypoxia ([place])   . COPD (chronic obstructive pulmonary disease) ([place])   . Diverticulitis   . Duodenal ulcer   . Essential hypertension   . Falls 01/18/2016  . Headache(784.0)   . Hyperlipidemia   . Left hand pain 12/13/2016  . Oxygen dependent   . Small bowel arteriovenous malformation     Past Surgical History:  Procedure Laterality Date  . COLONOSCOPY  07/22/2011   Procedure: COLONOSCOPY;  Surgeon: Rogene Houston, MD;  Location: [facility];  Service: Endoscopy;  Laterality: N/A;  . CORONARY ARTERY BYPASS GRAFT  1990   [facility] - LIMA to LAD  . ESOPHAGOGASTRODUODENOSCOPY  07/22/2011   Procedure: ESOPHAGOGASTRODUODENOSCOPY (EGD);  Surgeon: Rogene Houston, MD;  Location: [facility];  Service: Endoscopy;  Laterality: N/A;   . ESOPHAGOGASTRODUODENOSCOPY N/A 04/23/2013   Procedure: ESOPHAGOGASTRODUODENOSCOPY (EGD);  Surgeon: Rogene Houston, MD;  Location: [facility];  Service: Endoscopy;  Laterality: N/A;  . EYE SURGERY    . GIVENS CAPSULE STUDY  07/30/2011   Procedure: GIVENS CAPSULE STUDY;  Surgeon: Rogene Houston, MD;  Location: [facility];  Service: Endoscopy;  Laterality: N/A;  7:30  . Hand surgery Right   . MASTECTOMY Left     Family History  Problem Relation Age of Onset  . Prostate cancer Brother   . Prostate cancer Brother   . Breast cancer Daughter        Breast Cancer that mets  . Throat cancer Daughter   . Healthy Daughter   . Healthy Son   . Diabetes Son   . Hypertension Son   . Healthy Son   . Healthy Son   . Healthy Son   . Heart disease Son        Cardiac arrythmia, with defibrillator  . Healthy Son   . Prostate cancer Son   . CAD Son     Social History   Socioeconomic History  . Marital status: Widowed    Spouse name: Not on file  . Number of children: Not on file  . Years of education: Not on file  . Highest education level: Not on file  Social Needs  . Financial resource strain: Not on  file  . Food insecurity - worry: Not on file  . Food insecurity - inability: Not on file  . Transportation needs - medical: Not on file  . Transportation needs - non-medical: Not on file  Occupational History  . Not on file  Tobacco Use  . Smoking status: Former Smoker    Packs/day: 1.00    Years: 30.00    Pack years: 30.00    Types: Cigarettes    Last attempt to quit: 10/14/1994    Years since quitting: 23.0  . Smokeless tobacco: Never Used  . Tobacco comment: Patient quit smoking 25 years ago  Substance and Sexual Activity  . Alcohol use: No    Alcohol/week: 1.2 oz    Types: 1 Glasses of wine, 1 Cans of beer per week    Comment: none for 8 months, wine occasionally  . Drug use: No  . 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 no dietary restrictions.   Other Topics Concern  . Not on file  Social History Narrative  . Not on file    Outpatient Medications Prior to Visit  Medication Sig Dispense Refill  . acetaminophen (TYLENOL) 500 MG tablet Take 1,000 mg by mouth daily as needed for moderate pain.    Marland Kitchen amLODipine (NORVASC) 5 MG tablet Take 1 tablet (5 mg total) by mouth daily. 30 tablet 1  . aspirin EC 81 MG tablet Take 81 mg by mouth daily.    Marland Kitchen atorvastatin (LIPITOR) 40 MG tablet Take 40 mg by mouth daily.    Marland Kitchen BREO ELLIPTA 100-25 MCG/INH AEPB Inhale 1 puff into the lungs daily.     . carvedilol (COREG) 6.25 MG tablet Take 6.25 mg by mouth 2 (two) times daily with a meal.    . chlorthalidone (HYGROTON) 50 MG tablet Take 50 mg by mouth daily.    . ferrous sulfate 325 (65 FE) MG tablet Take 1 tablet (325 mg total) by mouth 2 (two) times daily with a meal. (Patient taking differently: Take 325 mg by mouth daily with breakfast. ) 30 tablet 3  . furosemide (LASIX) 40 MG tablet Take 1 tablet (40 mg total) by mouth daily as needed for fluid or edema. 30 tablet 0  . hydroxypropyl methylcellulose / hypromellose (ISOPTO TEARS / GONIOVISC) 2.5 % ophthalmic solution Place 1 drop into both eyes daily as needed for dry eyes.    Marland Kitchen ipratropium-albuterol (DUONEB) 0.5-2.5 (3) MG/3ML SOLN Take 3 mLs by nebulization every 6 (six) hours as needed (shortness of breath).     . isosorbide mononitrate (IMDUR) 30 MG 24 hr tablet Take 30 mg by mouth 2 (two) times daily.     . nitroGLYCERIN (NITROSTAT) 0.4 MG SL tablet Place 1 tablet (0.4 mg total) under the tongue every 5 (five) minutes as needed for chest pain. If no relief after 3 doses to ER 25 tablet 3  . omeprazole (PRILOSEC) 20 MG capsule Take 1 capsule by mouth daily.    . potassium chloride (K-DUR) 10 MEQ tablet Take 1 tablet (10 mEq total) by mouth daily. 30 tablet 0  . PROAIR HFA 108 (90 BASE) MCG/ACT inhaler Inhale 2 puffs into the lungs every  6 (six) hours as needed for wheezing or shortness of breath.     . warfarin (COUMADIN) 7.5 MG tablet Take 7.5 mg by mouth daily. Patient take 7.5mg  on Monday only and 4mg  all other days  No facility-administered medications prior to visit.     No Known Allergies  Review of Systems  Constitutional: Positive for malaise/fatigue. Negative for fever.  HENT: Negative for congestion.   Eyes: Negative for blurred vision.  Respiratory: Positive for shortness of breath.   Cardiovascular: Negative for chest pain, palpitations and leg swelling.  Gastrointestinal: Negative for abdominal pain, blood in stool and nausea.  Genitourinary: Negative for dysuria and frequency.  Musculoskeletal: Negative for falls.  Skin: Negative for rash.  Neurological: Positive for weakness. Negative for dizziness, loss of consciousness and headaches.  Endo/Heme/Allergies: Negative for environmental allergies.  Psychiatric/Behavioral: Negative for depression. The patient is not nervous/anxious.        Objective:    Physical Exam  Constitutional: She is oriented to person, place, and time. She appears well-developed and well-nourished. No distress.  HENT:  Head: Normocephalic and atraumatic.  Nose: Nose normal.  Eyes: Right eye exhibits no discharge. Left eye exhibits no discharge.  Neck: Normal range of motion. Neck supple.  Cardiovascular: Normal rate.  No murmur heard. Irregularly irregular  Pulmonary/Chest: Effort normal and breath sounds normal.  Abdominal: Soft. Bowel sounds are normal. There is no tenderness.  Musculoskeletal: She exhibits no edema.  Neurological: She is alert and oriented to person, place, and time.  Skin: Skin is warm and dry.  Psychiatric: She has a normal mood and affect.  Nursing note and vitals reviewed.   BP 130/66 (BP Location: Left Arm, Patient Position: Sitting, Cuff Size: Normal)   Pulse 97   Temp 97.7 F (36.5 C) (Oral)   Resp 18   Wt 123 lb (55.8 kg)   SpO2 97%    BMI (P) 24.02 kg/m  Wt Readings from Last 3 Encounters:  10/10/17 123 lb (55.8 kg)  08/18/17 124 lb 12.8 oz (56.6 kg)  06/16/17 124 lb 9.6 oz (56.5 kg)   BP Readings from Last 3 Encounters:  10/10/17 130/66  08/18/17 (!) 128/46  06/16/17 136/62     Immunization History  Administered Date(s) Administered  . Influenza, High Dose Seasonal PF 09/02/2016, 08/18/2017  . Influenza,inj,Quad PF,6+ Mos 10/16/2014, 08/29/2015  . Pneumococcal-Unspecified 11/16/2011  . Td 07/05/2016    Health Maintenance  Topic Date Due  . DEXA SCAN  01/22/2002  . PNA vac Low Risk Adult (2 of 2 - PCV13) 11/15/2012  . TETANUS/TDAP  07/05/2026  . INFLUENZA VACCINE  Completed    Lab Results  Component Value Date   WBC 7.9 06/16/2017   HGB 11.9 (L) 06/16/2017   HCT 38.1 06/16/2017   PLT 315.0 06/16/2017   GLUCOSE 89 06/16/2017   CHOL 114 06/16/2017   TRIG 82.0 06/16/2017   HDL 45.30 06/16/2017   LDLCALC 52 06/16/2017   ALT 11 06/16/2017   AST 14 06/16/2017   NA 140 06/16/2017   K 3.6 06/16/2017   CL 105 06/16/2017   CREATININE 0.82 06/16/2017   BUN 24 (H) 06/16/2017   CO2 29 06/16/2017   TSH 1.28 06/16/2017   INR 2.41 (H) 06/04/2015   HGBA1C 6.2 (H) 07/19/2011    Lab Results  Component Value Date   TSH 1.28 06/16/2017   Lab Results  Component Value Date   WBC 7.9 06/16/2017   HGB 11.9 (L) 06/16/2017   HCT 38.1 06/16/2017   MCV 72.5 (L) 06/16/2017   PLT 315.0 06/16/2017   Lab Results  Component Value Date   NA 140 06/16/2017   K 3.6 06/16/2017   CO2 29 06/16/2017   GLUCOSE 89 06/16/2017  BUN 24 (H) 06/16/2017   CREATININE 0.82 06/16/2017   BILITOT 0.4 06/16/2017   ALKPHOS 84 06/16/2017   AST 14 06/16/2017   ALT 11 06/16/2017   PROT 6.9 06/16/2017   ALBUMIN 3.9 06/16/2017   CALCIUM 9.9 06/16/2017   ANIONGAP 8 03/10/2016   GFR 86.18 06/16/2017   Lab Results  Component Value Date   CHOL 114 06/16/2017   Lab Results  Component Value Date   HDL 45.30 06/16/2017    Lab Results  Component Value Date   LDLCALC 52 06/16/2017   Lab Results  Component Value Date   TRIG 82.0 06/16/2017   Lab Results  Component Value Date   CHOLHDL 3 06/16/2017   Lab Results  Component Value Date   HGBA1C 6.2 (H) 07/19/2011         Assessment & Plan:   Problem List Items Addressed This Visit    CAD (coronary artery disease) (Chronic)    Follows with cardiology in [place], [place]. Dr Lupita Shutter will get records      HTN (hypertension) (Chronic)    Well controlled, no changes to meds. Encouraged heart healthy diet such as the DASH diet and exercise as tolerated.       Relevant Orders   CBC   Comprehensive metabolic panel   Iron deficiency anemia    Increase leafy greens, consider increased lean red meat and using cast iron cookware. Continue to monitor, report any concerns. Check CBC      Atrial fibrillation (HCC) - Primary   Relevant Orders   INR/PT   GERD (gastroesophageal reflux disease)    Avoid offending foods, start probiotics. Do not eat large meals in late evening and consider raising head of bed.       Hyperlipidemia    Tolerating statin, encouraged heart healthy diet, avoid trans fats, minimize simple carbs and saturated fats. Increase exercise as tolerated      Relevant Orders   Lipid panel   TSH   Hypoxia    97% on 2L      Bradycardia    Improved with discontinuation ob beta blocker and CCB      B12 deficiency    Shot given today      Relevant Orders   Vitamin B12   Constipation    Encouraged increased hydration and fiber in diet. Daily probiotics. If bowels not moving can use MOM 2 tbls po in 4 oz of warm prune juice by mouth every 2-3 days. If no results then repeat in 4 hours with  Dulcolax suppository pr, may repeat again in 4 more hours as needed. Seek care if symptoms worsen. Consider daily Miralax and/or Dulcolax if symptoms persist. Miralax and Benefiber twice daily         I am having Yesenia Woods maintain  her BREO ELLIPTA, PROAIR HFA, isosorbide mononitrate, acetaminophen, furosemide, ipratropium-albuterol, omeprazole, nitroGLYCERIN, aspirin EC, warfarin, hydroxypropyl methylcellulose / hypromellose, amLODipine, ferrous sulfate, potassium chloride, carvedilol, chlorthalidone, and atorvastatin.  No orders of the defined types were placed in this encounter.   CMA served as Education administrator during this visit. History, Physical and Plan performed by medical provider. Documentation and orders reviewed and attested to.  Penni Homans, MD

## 2017-10-10 NOTE — Assessment & Plan Note (Signed)
Improved with discontinuation ob beta blocker and CCB

## 2017-10-10 NOTE — Assessment & Plan Note (Signed)
Avoid offending foods, start probiotics. Do not eat large meals in late evening and consider raising head of bed.  

## 2017-10-10 NOTE — Assessment & Plan Note (Signed)
Follows with cardiology in [place], [place]. Dr Lupita Shutter will get records

## 2017-10-10 NOTE — Assessment & Plan Note (Signed)
Encouraged increased hydration and fiber in diet. Daily probiotics. If bowels not moving can use MOM 2 tbls po in 4 oz of warm prune juice by mouth every 2-3 days. If no results then repeat in 4 hours with  Dulcolax suppository pr, may repeat again in 4 more hours as needed. Seek care if symptoms worsen. Consider daily Miralax and/or Dulcolax if symptoms persist. Miralax and Benefiber twice daily 

## 2017-10-10 NOTE — Assessment & Plan Note (Signed)
Shot given today

## 2017-10-10 NOTE — Assessment & Plan Note (Signed)
Tolerating statin, encouraged heart healthy diet, avoid trans fats, minimize simple carbs and saturated fats. Increase exercise as tolerated 

## 2017-10-10 NOTE — Assessment & Plan Note (Signed)
97% on 2L

## 2017-10-10 NOTE — Assessment & Plan Note (Signed)
Well controlled, no changes to meds. Encouraged heart healthy diet such as the DASH diet and exercise as tolerated.  °

## 2017-10-10 NOTE — Patient Instructions (Addendum)
Encouraged increased hydration and fiber in diet. Daily probiotics. If bowels not moving can use MOM 2 tbls po in 4 oz of warm prune juice by mouth every 2-3 days. If no results then repeat in 4 hours with  Dulcolax suppository pr, may repeat again in 4 more hours as needed. Seek care if symptoms worsen. Consider daily Miralax and/or Dulcolax if symptoms persist.   Take Miralax/Glycolax and Benefiber once or twice daily  Pneumonia shot next visit  Hypertension Hypertension is another name for high blood pressure. High blood pressure forces your heart to work harder to pump blood. This can cause problems over time. There are two numbers in a blood pressure reading. There is a top number (systolic) over a bottom number (diastolic). It is best to have a blood pressure below 120/80. Healthy choices can help lower your blood pressure. You may need medicine to help lower your blood pressure if:  Your blood pressure cannot be lowered with healthy choices.  Your blood pressure is higher than 130/80.  Follow these instructions at home: Eating and drinking  If directed, follow the DASH eating plan. This diet includes: ? Filling half of your plate at each meal with fruits and vegetables. ? Filling one quarter of your plate at each meal with whole grains. Whole grains include whole wheat pasta, brown rice, and whole grain bread. ? Eating or drinking low-fat dairy products, such as skim milk or low-fat yogurt. ? Filling one quarter of your plate at each meal with low-fat (lean) proteins. Low-fat proteins include fish, skinless chicken, eggs, beans, and tofu. ? Avoiding fatty meat, cured and processed meat, or chicken with skin. ? Avoiding premade or processed food.  Eat less than 1,500 mg of salt (sodium) a day.  Limit alcohol use to no more than 1 drink a day for nonpregnant women and 2 drinks a day for men. One drink equals 12 oz of beer, 5 oz of wine, or 1 oz of hard liquor. Lifestyle  Work with  your doctor to stay at a healthy weight or to lose weight. Ask your doctor what the best weight is for you.  Get at least 30 minutes of exercise that causes your heart to beat faster (aerobic exercise) most days of the week. This may include walking, swimming, or biking.  Get at least 30 minutes of exercise that strengthens your muscles (resistance exercise) at least 3 days a week. This may include lifting weights or pilates.  Do not use any products that contain nicotine or tobacco. This includes cigarettes and e-cigarettes. If you need help quitting, ask your doctor.  Check your blood pressure at home as told by your doctor.  Keep all follow-up visits as told by your doctor. This is important. Medicines  Take over-the-counter and prescription medicines only as told by your doctor. Follow directions carefully.  Do not skip doses of blood pressure medicine. The medicine does not work as well if you skip doses. Skipping doses also puts you at risk for problems.  Ask your doctor about side effects or reactions to medicines that you should watch for. Contact a doctor if:  You think you are having a reaction to the medicine you are taking.  You have headaches that keep coming back (recurring).  You feel dizzy.  You have swelling in your ankles.  You have trouble with your vision. Get help right away if:  You get a very bad headache.  You start to feel confused.  You feel weak  or numb.  You feel faint.  You get very bad pain in your: ? Chest. ? Belly (abdomen).  You throw up (vomit) more than once.  You have trouble breathing. Summary  Hypertension is another name for high blood pressure.  Making healthy choices can help lower blood pressure. If your blood pressure cannot be controlled with healthy choices, you may need to take medicine. This information is not intended to replace advice given to you by your health care provider. Make sure you discuss any questions you  have with your health care provider. Document Released: 04/19/2008 Document Revised: 09/29/2016 Document Reviewed: 09/29/2016 Elsevier Interactive Patient Education  [employer].

## 2017-10-10 NOTE — Addendum Note (Signed)
Addended by: Magdalene Molly A on: 10/10/2017 09:50 AM   Modules accepted: Orders

## 2017-10-14 MED ORDER — POTASSIUM CHLORIDE CRYS ER 20 MEQ PO TBCR: EXTENDED_RELEASE_TABLET | ORAL | 0 refills | Status: DC

## 2017-10-14 NOTE — Addendum Note (Signed)
Addended by: Magdalene Molly A on: 10/14/2017 03:19 PM   Modules accepted: Orders

## 2017-10-18 ENCOUNTER — Telehealth: Payer: Self-pay | Admitting: *Deleted

## 2017-10-18 NOTE — Telephone Encounter (Signed)
Called and spoke with the patient and informed her that I spoke with Dr. Charlett Blake and informed her that the lab was unable to run the PT/INR because it was not enough blood to run the test.  And that she would not be able to come back and have the lab redrawn until January.  Informed the patient that per Dr. Charlett Blake she would like to have the PT/INR reading before January to see where she stands, so maybe she could go to a draw station in [place] and have her blood redraw.  The patient stated that she does not want to have her blood work done in [place].  She stated that they  always have trouble getting her blood.  She said she went to [facility] last time in [place] and it was terrible.  So she will not go to anyone in [place] no matter what lab it is to have her blood drawn.  Pt stated that she's to go see her Cardiologist next week (Tues @ 9:00am),and they check her blood.  I asked her if the Cardiologist would check her PT/INR.  She stated that they always check her PT/INR, by sticking her finger .  Informed the patient that I could call the Cardiologist and get the PT/INR results, and she stated that she will know the results.  So I informed the pt that I will give her a call back next week after her appointment with the Cardiologist to get the results and then inform Dr. Charlett Blake of them.  Patient stated that would be fine. Please advise.//AB/CMA

## 2017-10-18 NOTE — Telephone Encounter (Signed)
That is a fine plan. Just follow up with patient after she sees her cardiologist.

## 2017-10-18 NOTE — Telephone Encounter (Signed)
Called and spoke with the patient on (10/12/17) and informed her that the lab called and stated that they was unable to run the PT/INR because it was not enough blood in the tube. So we will need to redraw the PT/INR.  Pt verbalized understanding and stated that she was not able to come back to have the blood redrawn because she lives in [place].  She also has trouble with transportation, and she also stated that she will not go anywhere to have her blood drawing.  The patient also said that she could get the blood redraw when she comes back to see [place].  Asked the patient when was her next appointment with [place] and she stated she was not sure.  Checked the patients up coming appointments and she is scheduled to come in for a nurse visit on (11/29/17 @ 9:00am).  The patient stated that she will not be coming back until that appointment.  Informed the patient that I will inform Dr. Charlett Blake that they was unable to run the blood work and see what she would like for her to do.  The patient stated again that she will not be coming back until January.//AB/CMA

## 2017-10-27 DIAGNOSIS — I369 Nonrheumatic tricuspid valve disorder, unspecified: Secondary | ICD-10-CM | POA: Diagnosis not present

## 2017-10-27 DIAGNOSIS — E785 Hyperlipidemia, unspecified: Secondary | ICD-10-CM | POA: Diagnosis not present

## 2017-10-27 DIAGNOSIS — I495 Sick sinus syndrome: Secondary | ICD-10-CM | POA: Diagnosis not present

## 2017-10-27 DIAGNOSIS — I059 Rheumatic mitral valve disease, unspecified: Secondary | ICD-10-CM | POA: Diagnosis not present

## 2017-10-27 DIAGNOSIS — I480 Paroxysmal atrial fibrillation: Secondary | ICD-10-CM | POA: Diagnosis not present

## 2017-10-27 DIAGNOSIS — I10 Essential (primary) hypertension: Secondary | ICD-10-CM | POA: Diagnosis not present

## 2017-10-27 DIAGNOSIS — Z7901 Long term (current) use of anticoagulants: Secondary | ICD-10-CM | POA: Diagnosis not present

## 2017-10-27 DIAGNOSIS — I2510 Atherosclerotic heart disease of native coronary artery without angina pectoris: Secondary | ICD-10-CM | POA: Diagnosis not present

## 2017-10-27 DIAGNOSIS — J449 Chronic obstructive pulmonary disease, unspecified: Secondary | ICD-10-CM | POA: Diagnosis not present

## 2017-10-27 DIAGNOSIS — J9611 Chronic respiratory failure with hypoxia: Secondary | ICD-10-CM | POA: Diagnosis not present

## 2017-10-28 DIAGNOSIS — I10 Essential (primary) hypertension: Secondary | ICD-10-CM | POA: Diagnosis not present

## 2017-10-28 DIAGNOSIS — J449 Chronic obstructive pulmonary disease, unspecified: Secondary | ICD-10-CM | POA: Diagnosis not present

## 2017-10-28 DIAGNOSIS — J9611 Chronic respiratory failure with hypoxia: Secondary | ICD-10-CM | POA: Diagnosis not present

## 2017-10-31 NOTE — Telephone Encounter (Signed)
Called Yesenia Woods back and she stated that the Warfarin is a 5mg  table.  She takes (2 1/2mg ) on M,W,F,and (5mg ) on T,TH,Sat, and Sun.  She stated that her INR is up and down and when she goes back to the Cardiologist on (11/25/17) the directions may be changed.//AB/CMA

## 2017-10-31 NOTE — Telephone Encounter (Signed)
thanks

## 2017-10-31 NOTE — Telephone Encounter (Signed)
Great just see if we can find out from patient what he changed her dose of coumadin to so we can keep up when she comes back here.

## 2017-10-31 NOTE — Telephone Encounter (Signed)
Called and spoke with the pt regarding her recent INR results which was done at her appointment with the Cardiologist this pass Thursday.  Pt stated that her INR:1.4  The Cardiologist address the results and made some changes.//AB/CMA

## 2017-11-02 ENCOUNTER — Telehealth: Payer: Self-pay | Admitting: *Deleted

## 2017-11-02 NOTE — Telephone Encounter (Signed)
Received Medical records from [facility]; forwarded to provider/SLS 12/19

## 2017-11-25 DIAGNOSIS — I10 Essential (primary) hypertension: Secondary | ICD-10-CM | POA: Diagnosis not present

## 2017-11-25 DIAGNOSIS — E785 Hyperlipidemia, unspecified: Secondary | ICD-10-CM | POA: Diagnosis not present

## 2017-11-25 DIAGNOSIS — I369 Nonrheumatic tricuspid valve disorder, unspecified: Secondary | ICD-10-CM | POA: Diagnosis not present

## 2017-11-25 DIAGNOSIS — I480 Paroxysmal atrial fibrillation: Secondary | ICD-10-CM | POA: Diagnosis not present

## 2017-11-25 DIAGNOSIS — I495 Sick sinus syndrome: Secondary | ICD-10-CM | POA: Diagnosis not present

## 2017-11-25 DIAGNOSIS — I2510 Atherosclerotic heart disease of native coronary artery without angina pectoris: Secondary | ICD-10-CM | POA: Diagnosis not present

## 2017-11-25 DIAGNOSIS — Z7901 Long term (current) use of anticoagulants: Secondary | ICD-10-CM | POA: Diagnosis not present

## 2017-11-25 DIAGNOSIS — I059 Rheumatic mitral valve disease, unspecified: Secondary | ICD-10-CM | POA: Diagnosis not present

## 2017-11-25 DIAGNOSIS — J9611 Chronic respiratory failure with hypoxia: Secondary | ICD-10-CM | POA: Diagnosis not present

## 2017-11-25 DIAGNOSIS — J449 Chronic obstructive pulmonary disease, unspecified: Secondary | ICD-10-CM | POA: Diagnosis not present

## 2017-11-29 ENCOUNTER — Ambulatory Visit (INDEPENDENT_AMBULATORY_CARE_PROVIDER_SITE_OTHER): Payer: Medicare Other

## 2017-11-29 DIAGNOSIS — Z23 Encounter for immunization: Secondary | ICD-10-CM | POA: Diagnosis not present

## 2017-11-29 DIAGNOSIS — E538 Deficiency of other specified B group vitamins: Secondary | ICD-10-CM

## 2017-11-29 MED ORDER — CYANOCOBALAMIN 1000 MCG/ML IJ SOLN
1000.0000 ug | Freq: Once | INTRAMUSCULAR | Status: AC
  Administered 2017-11-29: 1000 ug via INTRAMUSCULAR

## 2017-11-29 NOTE — Progress Notes (Signed)
Pre visit review using our clinic tool,if applicable. No additional management support is needed unless otherwise documented below in the visit note.   Patient in for B12 injection and Pneumococal immunization per order from Dr. Willette Alma.  Given both injections in Right deltoid per patient request. Patient tolerated well.  Return appointment given for B12 injection.

## 2017-12-28 DIAGNOSIS — I10 Essential (primary) hypertension: Secondary | ICD-10-CM | POA: Diagnosis not present

## 2017-12-28 DIAGNOSIS — I2510 Atherosclerotic heart disease of native coronary artery without angina pectoris: Secondary | ICD-10-CM | POA: Diagnosis not present

## 2017-12-28 DIAGNOSIS — I480 Paroxysmal atrial fibrillation: Secondary | ICD-10-CM | POA: Diagnosis not present

## 2017-12-28 DIAGNOSIS — I369 Nonrheumatic tricuspid valve disorder, unspecified: Secondary | ICD-10-CM | POA: Diagnosis not present

## 2017-12-28 DIAGNOSIS — E785 Hyperlipidemia, unspecified: Secondary | ICD-10-CM | POA: Diagnosis not present

## 2017-12-28 DIAGNOSIS — I059 Rheumatic mitral valve disease, unspecified: Secondary | ICD-10-CM | POA: Diagnosis not present

## 2017-12-30 ENCOUNTER — Ambulatory Visit (INDEPENDENT_AMBULATORY_CARE_PROVIDER_SITE_OTHER): Payer: Medicare Other

## 2017-12-30 DIAGNOSIS — E538 Deficiency of other specified B group vitamins: Secondary | ICD-10-CM

## 2017-12-30 MED ORDER — CYANOCOBALAMIN 1000 MCG/ML IJ SOLN
1000.0000 ug | Freq: Once | INTRAMUSCULAR | Status: AC
  Administered 2017-12-30: 1000 ug via INTRAMUSCULAR

## 2017-12-30 NOTE — Progress Notes (Signed)
Pt comes in today for her B12 injection. Received in right arm without complication at [id number]. Ot scheduled follow up visit during her departure at front desk.

## 2018-01-04 DIAGNOSIS — I2510 Atherosclerotic heart disease of native coronary artery without angina pectoris: Secondary | ICD-10-CM | POA: Diagnosis not present

## 2018-01-04 DIAGNOSIS — Z7901 Long term (current) use of anticoagulants: Secondary | ICD-10-CM | POA: Diagnosis not present

## 2018-01-04 DIAGNOSIS — E785 Hyperlipidemia, unspecified: Secondary | ICD-10-CM | POA: Diagnosis not present

## 2018-01-04 DIAGNOSIS — I480 Paroxysmal atrial fibrillation: Secondary | ICD-10-CM | POA: Diagnosis not present

## 2018-01-04 DIAGNOSIS — I10 Essential (primary) hypertension: Secondary | ICD-10-CM | POA: Diagnosis not present

## 2018-01-04 DIAGNOSIS — I059 Rheumatic mitral valve disease, unspecified: Secondary | ICD-10-CM | POA: Diagnosis not present

## 2018-01-04 DIAGNOSIS — I369 Nonrheumatic tricuspid valve disorder, unspecified: Secondary | ICD-10-CM | POA: Diagnosis not present

## 2018-01-26 DIAGNOSIS — I4891 Unspecified atrial fibrillation: Secondary | ICD-10-CM | POA: Diagnosis not present

## 2018-01-26 DIAGNOSIS — I071 Rheumatic tricuspid insufficiency: Secondary | ICD-10-CM | POA: Diagnosis not present

## 2018-01-26 DIAGNOSIS — I340 Nonrheumatic mitral (valve) insufficiency: Secondary | ICD-10-CM | POA: Diagnosis not present

## 2018-01-27 ENCOUNTER — Ambulatory Visit: Payer: Medicare Other

## 2018-01-27 DIAGNOSIS — I10 Essential (primary) hypertension: Secondary | ICD-10-CM | POA: Diagnosis not present

## 2018-01-27 DIAGNOSIS — J302 Other seasonal allergic rhinitis: Secondary | ICD-10-CM | POA: Diagnosis not present

## 2018-01-27 DIAGNOSIS — J9611 Chronic respiratory failure with hypoxia: Secondary | ICD-10-CM | POA: Diagnosis not present

## 2018-01-27 DIAGNOSIS — I480 Paroxysmal atrial fibrillation: Secondary | ICD-10-CM | POA: Diagnosis not present

## 2018-01-27 DIAGNOSIS — J449 Chronic obstructive pulmonary disease, unspecified: Secondary | ICD-10-CM | POA: Diagnosis not present

## 2018-02-01 DIAGNOSIS — I369 Nonrheumatic tricuspid valve disorder, unspecified: Secondary | ICD-10-CM | POA: Diagnosis not present

## 2018-02-01 DIAGNOSIS — I10 Essential (primary) hypertension: Secondary | ICD-10-CM | POA: Diagnosis not present

## 2018-02-01 DIAGNOSIS — I059 Rheumatic mitral valve disease, unspecified: Secondary | ICD-10-CM | POA: Diagnosis not present

## 2018-02-01 DIAGNOSIS — I480 Paroxysmal atrial fibrillation: Secondary | ICD-10-CM | POA: Diagnosis not present

## 2018-02-01 DIAGNOSIS — Z7901 Long term (current) use of anticoagulants: Secondary | ICD-10-CM | POA: Diagnosis not present

## 2018-02-01 DIAGNOSIS — I2510 Atherosclerotic heart disease of native coronary artery without angina pectoris: Secondary | ICD-10-CM | POA: Diagnosis not present

## 2018-02-01 DIAGNOSIS — E785 Hyperlipidemia, unspecified: Secondary | ICD-10-CM | POA: Diagnosis not present

## 2018-02-23 ENCOUNTER — Telehealth: Payer: Self-pay | Admitting: Emergency Medicine

## 2018-02-23 ENCOUNTER — Ambulatory Visit (INDEPENDENT_AMBULATORY_CARE_PROVIDER_SITE_OTHER): Payer: Medicare Other | Admitting: Family Medicine

## 2018-02-23 ENCOUNTER — Encounter: Payer: Self-pay | Admitting: Family Medicine

## 2018-02-23 DIAGNOSIS — K5900 Constipation, unspecified: Secondary | ICD-10-CM

## 2018-02-23 DIAGNOSIS — I482 Chronic atrial fibrillation, unspecified: Secondary | ICD-10-CM

## 2018-02-23 DIAGNOSIS — J439 Emphysema, unspecified: Secondary | ICD-10-CM | POA: Diagnosis not present

## 2018-02-23 DIAGNOSIS — E876 Hypokalemia: Secondary | ICD-10-CM

## 2018-02-23 DIAGNOSIS — E538 Deficiency of other specified B group vitamins: Secondary | ICD-10-CM | POA: Diagnosis not present

## 2018-02-23 DIAGNOSIS — E785 Hyperlipidemia, unspecified: Secondary | ICD-10-CM | POA: Diagnosis not present

## 2018-02-23 DIAGNOSIS — I10 Essential (primary) hypertension: Secondary | ICD-10-CM

## 2018-02-23 DIAGNOSIS — D508 Other iron deficiency anemias: Secondary | ICD-10-CM | POA: Diagnosis not present

## 2018-02-23 LAB — CBC WITH DIFFERENTIAL/PLATELET
BASOS ABS: 0.1 10*3/uL (ref 0.0–0.1)
Basophils Relative: 1.2 % (ref 0.0–3.0)
EOS ABS: 0.1 10*3/uL (ref 0.0–0.7)
Eosinophils Relative: 1.6 % (ref 0.0–5.0)
HCT: 38.6 % (ref 36.0–46.0)
Hemoglobin: 12.5 g/dL (ref 12.0–15.0)
LYMPHS ABS: 1.2 10*3/uL (ref 0.7–4.0)
Lymphocytes Relative: 15.9 % (ref 12.0–46.0)
MCHC: 32.4 g/dL (ref 30.0–36.0)
MCV: 75.5 fl — ABNORMAL LOW (ref 78.0–100.0)
Monocytes Absolute: 1.2 10*3/uL — ABNORMAL HIGH (ref 0.1–1.0)
Monocytes Relative: 15.6 % — ABNORMAL HIGH (ref 3.0–12.0)
NEUTROS ABS: 4.9 10*3/uL (ref 1.4–7.7)
NEUTROS PCT: 65.7 % (ref 43.0–77.0)
PLATELETS: 339 10*3/uL (ref 150.0–400.0)
RBC: 5.12 Mil/uL — ABNORMAL HIGH (ref 3.87–5.11)
RDW: 17 % — ABNORMAL HIGH (ref 11.5–15.5)
WBC: 7.5 10*3/uL (ref 4.0–10.5)

## 2018-02-23 LAB — COMPREHENSIVE METABOLIC PANEL
ALK PHOS: 100 U/L (ref 39–117)
ALT: 8 U/L (ref 0–35)
AST: 15 U/L (ref 0–37)
Albumin: 4.2 g/dL (ref 3.5–5.2)
BUN: 9 mg/dL (ref 6–23)
CALCIUM: 10.1 mg/dL (ref 8.4–10.5)
CO2: 28 mEq/L (ref 19–32)
CREATININE: 0.74 mg/dL (ref 0.40–1.20)
Chloride: 99 mEq/L (ref 96–112)
GFR: 96.85 mL/min (ref >60.00)
GLUCOSE: 73 mg/dL (ref 70–99)
Potassium: 2.5 mEq/L — CL (ref 3.5–5.1)
SODIUM: 136 meq/L (ref 135–145)
TOTAL PROTEIN: 7.7 g/dL (ref 6.0–8.3)
Total Bilirubin: 0.8 mg/dL (ref 0.2–1.2)

## 2018-02-23 MED ORDER — CYANOCOBALAMIN 1000 MCG/ML IJ SOLN
1000.0000 ug | Freq: Once | INTRAMUSCULAR | Status: AC
  Administered 2018-02-23: 1000 ug via INTRAMUSCULAR

## 2018-02-23 MED ORDER — POTASSIUM CHLORIDE CRYS ER 20 MEQ PO TBCR: EXTENDED_RELEASE_TABLET | ORAL | 0 refills | Status: AC

## 2018-02-23 MED ORDER — CETIRIZINE HCL 10 MG PO TABS: 10.0000 mg | ORAL_TABLET | Freq: Every day | ORAL | 11 refills | Status: AC

## 2018-02-23 NOTE — Assessment & Plan Note (Addendum)
Increase leafy greens, consider increased lean red meat and using cast iron cookware. Continue to monitor, report any concerns. Cbc confirms improving no changes

## 2018-02-23 NOTE — Telephone Encounter (Signed)
"  CRITICAL VALUE STICKER  CRITICAL VALUE:Potassium 2.5  RECEIVER (on-site recipient of call):Tyvon Eggenberger P.  DATE & TIME NOTIFIED: 1439 MESSENGER (representative from lab):Hope  MD NOTIFIED: Dr. Charlett Blake  TIME OF NOTIFICATION:1440  RESPONSE: She will handle it

## 2018-02-23 NOTE — Assessment & Plan Note (Signed)
Was given a shot today

## 2018-02-23 NOTE — Telephone Encounter (Signed)
[email or web address]

## 2018-02-23 NOTE — Patient Instructions (Signed)
Singulair/Montelukast 10 mg tab for allergiesAllergic Rhinitis, Adult Allergic rhinitis is an allergic reaction that affects the mucous membrane inside the nose. It causes sneezing, a runny or stuffy nose, and the feeling of mucus going down the back of the throat (postnasal drip). Allergic rhinitis can be mild to severe. There are two types of allergic rhinitis:  Seasonal. This type is also called hay fever. It happens only during certain seasons.  Perennial. This type can happen at any time of the year.  What are the causes? This condition happens when the body's defense system (immune system) responds to certain harmless substances called allergens as though they were germs.  Seasonal allergic rhinitis is triggered by pollen, which can come from grasses, trees, and weeds. Perennial allergic rhinitis may be caused by:  House dust mites.  Pet dander.  Mold spores.  What are the signs or symptoms? Symptoms of this condition include:  Sneezing.  Runny or stuffy nose (nasal congestion).  Postnasal drip.  Itchy nose.  Tearing of the eyes.  Trouble sleeping.  Daytime sleepiness.  How is this diagnosed? This condition may be diagnosed based on:  Your medical history.  A physical exam.  Tests to check for related conditions, such as: ? Asthma. ? Pink eye. ? Ear infection. ? Upper respiratory infection.  Tests to find out which allergens trigger your symptoms. These may include skin or blood tests.  How is this treated? There is no cure for this condition, but treatment can help control symptoms. Treatment may include:  Taking medicines that block allergy symptoms, such as antihistamines. Medicine may be given as a shot, nasal spray, or pill.  Avoiding the allergen.  Desensitization. This treatment involves getting ongoing shots until your body becomes less sensitive to the allergen. This treatment may be done if other treatments do not help.  If taking medicine  and avoiding the allergen does not work, new, stronger medicines may be prescribed.  Follow these instructions at home:  Find out what you are allergic to. Common allergens include smoke, dust, and pollen.  Avoid the things you are allergic to. These are some things you can do to help avoid allergens: ? Replace carpet with wood, tile, or vinyl flooring. Carpet can trap dander and dust. ? Do not smoke. Do not allow smoking in your home. ? Change your heating and air conditioning filter at least once a month. ? During allergy season:  Keep windows closed as much as possible.  Plan outdoor activities when pollen counts are lowest. This is usually during the evening hours.  When coming indoors, change clothing and shower before sitting on furniture or bedding.  Take over-the-counter and prescription medicines only as told by your health care provider.  Keep all follow-up visits as told by your health care provider. This is important. Contact a health care provider if:  You have a fever.  You develop a persistent cough.  You make whistling sounds when you breathe (you wheeze).  Your symptoms interfere with your normal daily activities. Get help right away if:  You have shortness of breath. Summary  This condition can be managed by taking medicines as directed and avoiding allergens.  Contact your health care provider if you develop a persistent cough or fever.  During allergy season, keep windows closed as much as possible. This information is not intended to replace advice given to you by your health care provider. Make sure you discuss any questions you have with your health care provider. Document  Released: 07/27/2001 Document Revised: 12/09/2016 Document Reviewed: 12/09/2016 Elsevier Interactive Patient Education  [employer].

## 2018-02-23 NOTE — Assessment & Plan Note (Signed)
No recent exacerbation 

## 2018-02-23 NOTE — Progress Notes (Signed)
Subjective:  I acted as a Education administrator for [employer]. Yesenia Woods, [place]   Patient ID: Yesenia Woods, female    DOB: 07-02-1935, 83 y.o.   MRN: [id number]  Chief Complaint  Patient presents with  . Follow-up    HPI  Patient is in today for follow up visit and she reports no recent hospitalizations or acute febrile illness. She does note feeling tired today but no other acute concerns. Denies CP/palp/SOB/HA/congestion/fevers/GI or GU c/o in office. Taking meds as prescribed  Patient Care Team: Mosie Lukes, MD as PCP - General (Family Medicine) Delanna Notice, MD as Referring Physician (Internal Medicine) Vicente Males, MD (Pulmonary Disease)   Past Medical History:  Diagnosis Date  . Anemia   . Arthritis   . Atrial fibrillation ([place])   . B12 deficiency 04/10/2015  . Breast cancer ([place]) 12/09/2014   Mastectomy on left, chemo x 3 doses  . CAD (coronary artery disease)    Status post LIMA to LAD at [facility] 04/1989  . Chronic respiratory failure with hypoxia ([place])   . COPD (chronic obstructive pulmonary disease) ([place])   . Diverticulitis   . Duodenal ulcer   . Essential hypertension   . Falls 01/18/2016  . Headache(784.0)   . Hyperlipidemia   . Left hand pain 12/13/2016  . Oxygen dependent   . Small bowel arteriovenous malformation     Past Surgical History:  Procedure Laterality Date  . COLONOSCOPY  07/22/2011   Procedure: COLONOSCOPY;  Surgeon: Rogene Houston, MD;  Location: [facility];  Service: Endoscopy;  Laterality: N/A;  . CORONARY ARTERY BYPASS GRAFT  1990   [facility] - LIMA to LAD  . ESOPHAGOGASTRODUODENOSCOPY  07/22/2011   Procedure: ESOPHAGOGASTRODUODENOSCOPY (EGD);  Surgeon: Rogene Houston, MD;  Location: [facility];  Service: Endoscopy;  Laterality: N/A;  . ESOPHAGOGASTRODUODENOSCOPY N/A 04/23/2013   Procedure: ESOPHAGOGASTRODUODENOSCOPY (EGD);  Surgeon: Rogene Houston, MD;  Location: [facility];  Service: Endoscopy;  Laterality: N/A;  . EYE SURGERY    . GIVENS  CAPSULE STUDY  07/30/2011   Procedure: GIVENS CAPSULE STUDY;  Surgeon: Rogene Houston, MD;  Location: [facility];  Service: Endoscopy;  Laterality: N/A;  7:30  . Hand surgery Right   . MASTECTOMY Left     Family History  Problem Relation Age of Onset  . Prostate cancer Brother   . Prostate cancer Brother   . Breast cancer Daughter        Breast Cancer that mets  . Throat cancer Daughter   . Healthy Daughter   . Healthy Son   . Diabetes Son   . Hypertension Son   . Healthy Son   . Healthy Son   . Healthy Son   . Heart disease Son        Cardiac arrythmia, with defibrillator  . Healthy Son   . Prostate cancer Son   . CAD Son     Social History   Socioeconomic History  . Marital status: Widowed    Spouse name: Not on file  . Number of children: Not on file  . Years of education: Not on file  . Highest education level: Not on file  Occupational History  . Not on file  Social Needs  . Financial resource strain: Not on file  . Food insecurity:    Worry: Not on file    Inability: Not on file  . Transportation needs:    Medical: Not on file    Non-medical:  Not on file  Tobacco Use  . Smoking status: Former Smoker    Packs/day: 1.00    Years: 30.00    Pack years: 30.00    Types: Cigarettes    Last attempt to quit: 10/14/1994    Years since quitting: 23.3  . Smokeless tobacco: Never Used  . Tobacco comment: Patient quit smoking 25 years ago  Substance and Sexual Activity  . Alcohol use: No    Alcohol/week: 1.2 oz    Types: 1 Glasses of wine, 1 Cans of beer per week    Comment: none for 8 months, wine occasionally  . Drug use: No  . 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 no dietary restrictions.   Lifestyle  . Physical activity:    Days per week: Not on file    Minutes per session: Not on file  . Stress: Not on file  Relationships  . Social connections:    Talks on phone:  Not on file    Gets together: Not on file    Attends religious service: Not on file    Active member of club or organization: Not on file    Attends meetings of clubs or organizations: Not on file    Relationship status: Not on file  . Intimate partner violence:    Fear of current or ex partner: Not on file    Emotionally abused: Not on file    Physically abused: Not on file    Forced sexual activity: Not on file  Other Topics Concern  . Not on file  Social History Narrative  . Not on file    Outpatient Medications Prior to Visit  Medication Sig Dispense Refill  . acetaminophen (TYLENOL) 500 MG tablet Take 1,000 mg by mouth daily as needed for moderate pain.    Marland Kitchen amLODipine (NORVASC) 5 MG tablet Take 1 tablet (5 mg total) by mouth daily. 30 tablet 1  . aspirin EC 81 MG tablet Take 81 mg by mouth daily.    Marland Kitchen atorvastatin (LIPITOR) 40 MG tablet Take 40 mg by mouth daily.    Marland Kitchen BREO ELLIPTA 100-25 MCG/INH AEPB Inhale 1 puff into the lungs daily.     . carvedilol (COREG) 6.25 MG tablet Take 6.25 mg by mouth 2 (two) times daily with a meal.    . chlorthalidone (HYGROTON) 50 MG tablet Take 50 mg by mouth daily.    . ferrous sulfate 325 (65 FE) MG tablet Take 1 tablet (325 mg total) by mouth 2 (two) times daily with a meal. (Patient taking differently: Take 325 mg by mouth daily with breakfast. ) 30 tablet 3  . furosemide (LASIX) 40 MG tablet Take 1 tablet (40 mg total) by mouth daily as needed for fluid or edema. 30 tablet 0  . hydroxypropyl methylcellulose / hypromellose (ISOPTO TEARS / GONIOVISC) 2.5 % ophthalmic solution Place 1 drop into both eyes daily as needed for dry eyes.    Marland Kitchen ipratropium-albuterol (DUONEB) 0.5-2.5 (3) MG/3ML SOLN Take 3 mLs by nebulization every 6 (six) hours as needed (shortness of breath).     . isosorbide mononitrate (IMDUR) 30 MG 24 hr tablet Take 30 mg by mouth 2 (two) times daily.     . nitroGLYCERIN (NITROSTAT) 0.4 MG SL tablet Place 1 tablet (0.4 mg total)  under the tongue every 5 (five) minutes as needed for chest pain. If no relief after 3 doses to ER 25 tablet  3  . omeprazole (PRILOSEC) 20 MG capsule Take 1 capsule by mouth daily.    . potassium chloride (K-DUR) 10 MEQ tablet Take 1 tablet (10 mEq total) by mouth daily. 30 tablet 0  . PROAIR HFA 108 (90 BASE) MCG/ACT inhaler Inhale 2 puffs into the lungs every 6 (six) hours as needed for wheezing or shortness of breath.     . warfarin (COUMADIN) 7.5 MG tablet Take 7.5 mg by mouth daily. Patient take 7.5mg  on Monday only and 4mg  all other days    . potassium chloride SA (KLOR-CON M20) 20 MEQ tablet Take 2 tablets by mouth for 3 days then take 1 tablet daily 40 tablet 0   No facility-administered medications prior to visit.     No Known Allergies  Review of Systems  Constitutional: Positive for malaise/fatigue. Negative for fever.  HENT: Negative for congestion.   Eyes: Negative for blurred vision.  Respiratory: Negative for shortness of breath.   Cardiovascular: Negative for chest pain, palpitations and leg swelling.  Gastrointestinal: Negative for abdominal pain, blood in stool and nausea.  Genitourinary: Negative for dysuria and frequency.  Musculoskeletal: Negative for falls.  Skin: Negative for rash.  Neurological: Negative for dizziness, loss of consciousness and headaches.  Endo/Heme/Allergies: Negative for environmental allergies.  Psychiatric/Behavioral: Negative for depression. The patient is not nervous/anxious.        Objective:    Physical Exam  Constitutional: She is oriented to person, place, and time. She appears well-developed and well-nourished. No distress.  HENT:  Head: Normocephalic and atraumatic.  Nose: Nose normal.  Eyes: Right eye exhibits no discharge. Left eye exhibits no discharge.  Neck: Normal range of motion. Neck supple.  Cardiovascular: Normal rate.  No murmur heard. Pulmonary/Chest: Effort normal and breath sounds normal.  Abdominal: Soft.  Bowel sounds are normal. There is no tenderness.  Musculoskeletal: She exhibits no edema.  Neurological: She is alert and oriented to person, place, and time.  Skin: Skin is warm and dry.  Psychiatric: She has a normal mood and affect.  Nursing note and vitals reviewed.   BP (!) 142/70 (BP Location: Left Arm, Patient Position: Sitting, Cuff Size: Normal)   Pulse 64   Temp 98.2 F (36.8 C) (Oral)   Resp 14   Ht 4' 11.84" (1.52 m)   Wt 131 lb (59.4 kg)   SpO2 96%   BMI 25.72 kg/m  Wt Readings from Last 3 Encounters:  02/23/18 131 lb (59.4 kg)  10/10/17 123 lb (55.8 kg)  08/18/17 124 lb 12.8 oz (56.6 kg)   BP Readings from Last 3 Encounters:  02/23/18 (!) 142/70  10/10/17 130/66  08/18/17 (!) 128/46     Immunization History  Administered Date(s) Administered  . Influenza, High Dose Seasonal PF 09/02/2016, 08/18/2017  . Influenza,inj,Quad PF,6+ Mos 10/16/2014, 08/29/2015  . Pneumococcal Conjugate-13 11/29/2017  . Pneumococcal-Unspecified 11/16/2011  . Td 07/05/2016    Health Maintenance  Topic Date Due  . DEXA SCAN  01/22/2002  . INFLUENZA VACCINE  06/15/2018  . TETANUS/TDAP  07/05/2026  . PNA vac Low Risk Adult  Completed    Lab Results  Component Value Date   WBC 7.5 02/23/2018   HGB 12.5 02/23/2018   HCT 38.6 02/23/2018   PLT 339.0 02/23/2018   GLUCOSE 73 02/23/2018   CHOL 170 10/10/2017   TRIG 65.0 10/10/2017   HDL 84.80 10/10/2017   LDLCALC 72 10/10/2017   ALT 8 02/23/2018   AST 15 02/23/2018   NA 136 02/23/2018  K 2.5 (LL) 02/23/2018   CL 99 02/23/2018   CREATININE 0.74 02/23/2018   BUN 9 02/23/2018   CO2 28 02/23/2018   TSH 1.60 10/10/2017   INR 2.41 (H) 06/04/2015   HGBA1C 6.2 (H) 07/19/2011    Lab Results  Component Value Date   TSH 1.60 10/10/2017   Lab Results  Component Value Date   WBC 7.5 02/23/2018   HGB 12.5 02/23/2018   HCT 38.6 02/23/2018   MCV 75.5 (L) 02/23/2018   PLT 339.0 02/23/2018   Lab Results  Component Value  Date   NA 136 02/23/2018   K 2.5 (LL) 02/23/2018   CO2 28 02/23/2018   GLUCOSE 73 02/23/2018   BUN 9 02/23/2018   CREATININE 0.74 02/23/2018   BILITOT 0.8 02/23/2018   ALKPHOS 100 02/23/2018   AST 15 02/23/2018   ALT 8 02/23/2018   PROT 7.7 02/23/2018   ALBUMIN 4.2 02/23/2018   CALCIUM 10.1 02/23/2018   ANIONGAP 8 03/10/2016   GFR 96.85 02/23/2018   Lab Results  Component Value Date   CHOL 170 10/10/2017   Lab Results  Component Value Date   HDL 84.80 10/10/2017   Lab Results  Component Value Date   LDLCALC 72 10/10/2017   Lab Results  Component Value Date   TRIG 65.0 10/10/2017   Lab Results  Component Value Date   CHOLHDL 2 10/10/2017   Lab Results  Component Value Date   HGBA1C 6.2 (H) 07/19/2011         Assessment & Plan:   Problem List Items Addressed This Visit    HTN (hypertension) (Chronic)    Well controlled, no changes to meds. Encouraged heart healthy diet such as the DASH diet and exercise as tolerated.       Iron deficiency anemia    Increase leafy greens, consider increased lean red meat and using cast iron cookware. Continue to monitor, report any concerns. Cbc confirms improving no changes      Relevant Medications   cyanocobalamin ((VITAMIN B-12)) injection 1,000 mcg (Completed)   Other Relevant Orders   CBC with Differential/Platelet (Completed)   Atrial fibrillation ([place])    Rate controlled and following with a cardiology office closer to her home in [place].       Hyperlipidemia    Encouraged heart healthy diet, increase exercise, avoid trans fats, consider a krill oil cap daily      Hypokalemia    Recheck cmp confirms it is worsening. Patient is asymptomatic and spoke to patient on phone. She is made aware again how serious this is. If she develops any palpitations, chest pain etc she will present to ED. She is advised to go to ED to get IV KCL but she declines. He also refuses to take po KCL due to trouble swallowing it. She  is advised to stop her Chlorthalidone, increase potassium in her diet and she will confer with her cardiologist regarding follow up as she is unable to return to office here due to lack of transportation.       Relevant Orders   Comprehensive metabolic panel (Completed)   COPD (chronic obstructive pulmonary disease) (H[facility])    No recent exacerbation       Relevant Medications   cetirizine (ZYRTEC) 10 MG tablet   B12 deficiency    Was given a shot today      Relevant Medications   cyanocobalamin ((VITAMIN B-12)) injection 1,000 mcg (Completed)   Constipation    Improving some. Encouraged increased  hydration and fiber in diet. Daily probiotics. If bowels not moving can use MOM 2 tbls po in 4 oz of warm prune juice by mouth every 2-3 days. If no results then repeat in 4 hours with  Dulcolax suppository pr, may repeat again in 4 more hours as needed. Seek care if symptoms worsen. Consider daily Miralax and/or Dulcolax if symptoms persist.          I have changed Yesenia Woods's potassium chloride SA. I am also having her start on cetirizine. Additionally, I am having her maintain her BREO ELLIPTA, PROAIR HFA, isosorbide mononitrate, acetaminophen, furosemide, ipratropium-albuterol, omeprazole, nitroGLYCERIN, aspirin EC, warfarin, hydroxypropyl methylcellulose / hypromellose, amLODipine, ferrous sulfate, potassium chloride, carvedilol, chlorthalidone, and atorvastatin. We administered cyanocobalamin.  Meds ordered this encounter  Medications  . cetirizine (ZYRTEC) 10 MG tablet    Sig: Take 1 tablet (10 mg total) by mouth daily.    Dispense:  30 tablet    Refill:  11  . cyanocobalamin ((VITAMIN B-12)) injection 1,000 mcg  . potassium chloride SA (KLOR-CON M20) 20 MEQ tablet    Sig: Take 3 tablets by mouth for 5 days then take 2 tablets daily    Dispense:  65 tablet    Refill:  0    CMA served as scribe during this visit. History, Physical and Plan performed by medical provider.  Documentation and orders reviewed and attested to.  Penni Homans, MD

## 2018-02-23 NOTE — Assessment & Plan Note (Addendum)
Recheck cmp confirms it is worsening. Patient is asymptomatic and spoke to patient on phone. She is made aware again how serious this is. If she develops any palpitations, chest pain etc she will present to ED. She is advised to go to ED to get IV KCL but she declines. He also refuses to take po KCL due to trouble swallowing it. She is advised to stop her Chlorthalidone, increase potassium in her diet and she will confer with her cardiologist regarding follow up as she is unable to return to office here due to lack of transportation.

## 2018-02-23 NOTE — Assessment & Plan Note (Signed)
Well controlled, no changes to meds. Encouraged heart healthy diet such as the DASH diet and exercise as tolerated.  °

## 2018-02-23 NOTE — Assessment & Plan Note (Signed)
Encouraged heart healthy diet, increase exercise, avoid trans fats, consider a krill oil cap daily 

## 2018-02-26 NOTE — Assessment & Plan Note (Signed)
Improving some. Encouraged increased hydration and fiber in diet. Daily probiotics. If bowels not moving can use MOM 2 tbls po in 4 oz of warm prune juice by mouth every 2-3 days. If no results then repeat in 4 hours with  Dulcolax suppository pr, may repeat again in 4 more hours as needed. Seek care if symptoms worsen. Consider daily Miralax and/or Dulcolax if symptoms persist.

## 2018-02-26 NOTE — Assessment & Plan Note (Signed)
Rate controlled and following with a cardiology office closer to her home in [place].

## 2018-03-08 DIAGNOSIS — I480 Paroxysmal atrial fibrillation: Secondary | ICD-10-CM | POA: Diagnosis not present

## 2018-03-08 DIAGNOSIS — E785 Hyperlipidemia, unspecified: Secondary | ICD-10-CM | POA: Diagnosis not present

## 2018-03-08 DIAGNOSIS — I10 Essential (primary) hypertension: Secondary | ICD-10-CM | POA: Diagnosis not present

## 2018-03-08 DIAGNOSIS — I059 Rheumatic mitral valve disease, unspecified: Secondary | ICD-10-CM | POA: Diagnosis not present

## 2018-03-08 DIAGNOSIS — I369 Nonrheumatic tricuspid valve disorder, unspecified: Secondary | ICD-10-CM | POA: Diagnosis not present

## 2018-03-08 DIAGNOSIS — Z7901 Long term (current) use of anticoagulants: Secondary | ICD-10-CM | POA: Diagnosis not present

## 2018-03-08 DIAGNOSIS — I2510 Atherosclerotic heart disease of native coronary artery without angina pectoris: Secondary | ICD-10-CM | POA: Diagnosis not present

## 2018-03-21 ENCOUNTER — Ambulatory Visit: Payer: Medicare Other

## 2018-03-22 DIAGNOSIS — M7989 Other specified soft tissue disorders: Secondary | ICD-10-CM | POA: Diagnosis not present

## 2018-03-28 ENCOUNTER — Ambulatory Visit (INDEPENDENT_AMBULATORY_CARE_PROVIDER_SITE_OTHER): Payer: Medicare Other | Admitting: *Deleted

## 2018-03-28 DIAGNOSIS — E538 Deficiency of other specified B group vitamins: Secondary | ICD-10-CM | POA: Diagnosis not present

## 2018-03-28 MED ORDER — CYANOCOBALAMIN 1000 MCG/ML IJ SOLN
1000.0000 ug | Freq: Once | INTRAMUSCULAR | Status: AC
  Administered 2018-03-28: 1000 ug via INTRAMUSCULAR

## 2018-03-28 NOTE — Progress Notes (Signed)
Pt here for monthly B12 injection per Dr Charlett Blake.  B12 1029mcg given IM, right deltoid and pt tolerated procedure well.  Pt cancelled follow up with PCP in July. States she will have no transportation for July and scheduled follow up for 05/12/18. She will get B12 at that visit.

## 2018-04-20 DIAGNOSIS — I10 Essential (primary) hypertension: Secondary | ICD-10-CM | POA: Diagnosis not present

## 2018-04-20 DIAGNOSIS — I2510 Atherosclerotic heart disease of native coronary artery without angina pectoris: Secondary | ICD-10-CM | POA: Diagnosis not present

## 2018-04-20 DIAGNOSIS — I059 Rheumatic mitral valve disease, unspecified: Secondary | ICD-10-CM | POA: Diagnosis not present

## 2018-04-20 DIAGNOSIS — I369 Nonrheumatic tricuspid valve disorder, unspecified: Secondary | ICD-10-CM | POA: Diagnosis not present

## 2018-04-20 DIAGNOSIS — E785 Hyperlipidemia, unspecified: Secondary | ICD-10-CM | POA: Diagnosis not present

## 2018-04-20 DIAGNOSIS — I480 Paroxysmal atrial fibrillation: Secondary | ICD-10-CM | POA: Diagnosis not present

## 2018-04-21 ENCOUNTER — Other Ambulatory Visit: Payer: Self-pay | Admitting: Family Medicine

## 2018-04-21 DIAGNOSIS — K922 Gastrointestinal hemorrhage, unspecified: Secondary | ICD-10-CM | POA: Diagnosis not present

## 2018-04-21 DIAGNOSIS — K31811 Angiodysplasia of stomach and duodenum with bleeding: Secondary | ICD-10-CM | POA: Diagnosis not present

## 2018-04-21 DIAGNOSIS — E876 Hypokalemia: Secondary | ICD-10-CM | POA: Diagnosis not present

## 2018-04-21 DIAGNOSIS — I4891 Unspecified atrial fibrillation: Secondary | ICD-10-CM | POA: Diagnosis present

## 2018-04-21 DIAGNOSIS — Z87891 Personal history of nicotine dependence: Secondary | ICD-10-CM | POA: Diagnosis not present

## 2018-04-21 DIAGNOSIS — D649 Anemia, unspecified: Secondary | ICD-10-CM | POA: Diagnosis not present

## 2018-04-21 DIAGNOSIS — Z951 Presence of aortocoronary bypass graft: Secondary | ICD-10-CM | POA: Diagnosis not present

## 2018-04-21 DIAGNOSIS — R262 Difficulty in walking, not elsewhere classified: Secondary | ICD-10-CM | POA: Diagnosis present

## 2018-04-21 DIAGNOSIS — R791 Abnormal coagulation profile: Secondary | ICD-10-CM | POA: Diagnosis not present

## 2018-04-21 DIAGNOSIS — I10 Essential (primary) hypertension: Secondary | ICD-10-CM | POA: Diagnosis not present

## 2018-04-21 DIAGNOSIS — Z853 Personal history of malignant neoplasm of breast: Secondary | ICD-10-CM | POA: Diagnosis not present

## 2018-04-21 DIAGNOSIS — K921 Melena: Secondary | ICD-10-CM | POA: Diagnosis not present

## 2018-04-21 DIAGNOSIS — T45515A Adverse effect of anticoagulants, initial encounter: Secondary | ICD-10-CM | POA: Diagnosis not present

## 2018-04-21 DIAGNOSIS — E785 Hyperlipidemia, unspecified: Secondary | ICD-10-CM | POA: Diagnosis not present

## 2018-04-21 DIAGNOSIS — J449 Chronic obstructive pulmonary disease, unspecified: Secondary | ICD-10-CM | POA: Diagnosis present

## 2018-04-21 DIAGNOSIS — R945 Abnormal results of liver function studies: Secondary | ICD-10-CM | POA: Diagnosis not present

## 2018-04-21 DIAGNOSIS — Z9981 Dependence on supplemental oxygen: Secondary | ICD-10-CM | POA: Diagnosis not present

## 2018-04-21 DIAGNOSIS — I482 Chronic atrial fibrillation: Secondary | ICD-10-CM | POA: Diagnosis not present

## 2018-04-21 DIAGNOSIS — R160 Hepatomegaly, not elsewhere classified: Secondary | ICD-10-CM | POA: Diagnosis not present

## 2018-04-21 DIAGNOSIS — Z7901 Long term (current) use of anticoagulants: Secondary | ICD-10-CM | POA: Diagnosis not present

## 2018-04-21 DIAGNOSIS — J9611 Chronic respiratory failure with hypoxia: Secondary | ICD-10-CM | POA: Diagnosis present

## 2018-04-21 DIAGNOSIS — I160 Hypertensive urgency: Secondary | ICD-10-CM | POA: Diagnosis present

## 2018-04-21 DIAGNOSIS — K31819 Angiodysplasia of stomach and duodenum without bleeding: Secondary | ICD-10-CM | POA: Diagnosis not present

## 2018-04-21 DIAGNOSIS — D62 Acute posthemorrhagic anemia: Secondary | ICD-10-CM | POA: Diagnosis not present

## 2018-04-21 DIAGNOSIS — I2510 Atherosclerotic heart disease of native coronary artery without angina pectoris: Secondary | ICD-10-CM | POA: Diagnosis not present

## 2018-04-21 DIAGNOSIS — R042 Hemoptysis: Secondary | ICD-10-CM | POA: Diagnosis not present

## 2018-04-21 NOTE — Progress Notes (Signed)
Author received today's CBC with diff results from [facility], noting Hgb of 7.9,and called pt. To assess her symptoms. Pt. States her BMs have been black recently, but "not anymore", although endorses feeling fatigued, and short of breath, "but that's how I always am". Author encouraged pt. To go to the ED due to the low hemoglobin and discussed the repercussions of not going to get a blood transfusion, but pt expressed confusion over what Dr. Alroy Dust, her cardiologist, wanted, and stated that the ED in [place] may not do transfusions. Pt. gave author cardiologist office's phone number per author's request. Author phoned [phone], and spoke to Dr. Alroy Dust directly. Author stated pt. Wanted to hear what she needed to do from Dr. Alroy Dust for sake of clarification. Dr. Alroy Dust stated he would contact the patient and "take care of it". Findings routed to Dr. Charlett Blake.

## 2018-04-25 DIAGNOSIS — E162 Hypoglycemia, unspecified: Secondary | ICD-10-CM | POA: Diagnosis not present

## 2018-04-25 DIAGNOSIS — D649 Anemia, unspecified: Secondary | ICD-10-CM | POA: Diagnosis not present

## 2018-04-25 DIAGNOSIS — I248 Other forms of acute ischemic heart disease: Secondary | ICD-10-CM | POA: Diagnosis not present

## 2018-04-25 DIAGNOSIS — R791 Abnormal coagulation profile: Secondary | ICD-10-CM | POA: Diagnosis not present

## 2018-04-25 DIAGNOSIS — Z6826 Body mass index (BMI) 26.0-26.9, adult: Secondary | ICD-10-CM | POA: Diagnosis not present

## 2018-04-25 DIAGNOSIS — F0390 Unspecified dementia without behavioral disturbance: Secondary | ICD-10-CM | POA: Diagnosis present

## 2018-04-25 DIAGNOSIS — I482 Chronic atrial fibrillation: Secondary | ICD-10-CM | POA: Diagnosis not present

## 2018-04-25 DIAGNOSIS — R531 Weakness: Secondary | ICD-10-CM | POA: Diagnosis not present

## 2018-04-25 DIAGNOSIS — R748 Abnormal levels of other serum enzymes: Secondary | ICD-10-CM | POA: Diagnosis present

## 2018-04-25 DIAGNOSIS — Z7901 Long term (current) use of anticoagulants: Secondary | ICD-10-CM | POA: Diagnosis not present

## 2018-04-25 DIAGNOSIS — R7989 Other specified abnormal findings of blood chemistry: Secondary | ICD-10-CM | POA: Diagnosis not present

## 2018-04-25 DIAGNOSIS — E860 Dehydration: Secondary | ICD-10-CM | POA: Diagnosis not present

## 2018-04-25 DIAGNOSIS — K922 Gastrointestinal hemorrhage, unspecified: Secondary | ICD-10-CM | POA: Diagnosis not present

## 2018-04-25 DIAGNOSIS — I2510 Atherosclerotic heart disease of native coronary artery without angina pectoris: Secondary | ICD-10-CM | POA: Diagnosis not present

## 2018-04-25 DIAGNOSIS — Z86010 Personal history of colonic polyps: Secondary | ICD-10-CM | POA: Diagnosis not present

## 2018-04-25 DIAGNOSIS — I10 Essential (primary) hypertension: Secondary | ICD-10-CM | POA: Diagnosis not present

## 2018-04-25 DIAGNOSIS — J449 Chronic obstructive pulmonary disease, unspecified: Secondary | ICD-10-CM | POA: Diagnosis present

## 2018-04-25 DIAGNOSIS — N390 Urinary tract infection, site not specified: Secondary | ICD-10-CM | POA: Diagnosis present

## 2018-04-25 DIAGNOSIS — E876 Hypokalemia: Secondary | ICD-10-CM | POA: Diagnosis not present

## 2018-04-25 DIAGNOSIS — Z9981 Dependence on supplemental oxygen: Secondary | ICD-10-CM | POA: Diagnosis not present

## 2018-04-25 DIAGNOSIS — E861 Hypovolemia: Secondary | ICD-10-CM | POA: Diagnosis not present

## 2018-04-25 DIAGNOSIS — R799 Abnormal finding of blood chemistry, unspecified: Secondary | ICD-10-CM | POA: Diagnosis not present

## 2018-04-25 DIAGNOSIS — K31811 Angiodysplasia of stomach and duodenum with bleeding: Secondary | ICD-10-CM | POA: Diagnosis not present

## 2018-04-25 DIAGNOSIS — B961 Klebsiella pneumoniae [K. pneumoniae] as the cause of diseases classified elsewhere: Secondary | ICD-10-CM | POA: Diagnosis present

## 2018-04-25 DIAGNOSIS — E871 Hypo-osmolality and hyponatremia: Secondary | ICD-10-CM | POA: Diagnosis not present

## 2018-04-25 DIAGNOSIS — E46 Unspecified protein-calorie malnutrition: Secondary | ICD-10-CM | POA: Diagnosis not present

## 2018-04-25 DIAGNOSIS — J9610 Chronic respiratory failure, unspecified whether with hypoxia or hypercapnia: Secondary | ICD-10-CM | POA: Diagnosis not present

## 2018-04-25 DIAGNOSIS — Z853 Personal history of malignant neoplasm of breast: Secondary | ICD-10-CM | POA: Diagnosis not present

## 2018-04-25 DIAGNOSIS — E785 Hyperlipidemia, unspecified: Secondary | ICD-10-CM | POA: Diagnosis not present

## 2018-04-25 DIAGNOSIS — I998 Other disorder of circulatory system: Secondary | ICD-10-CM | POA: Diagnosis not present

## 2018-04-25 DIAGNOSIS — R1312 Dysphagia, oropharyngeal phase: Secondary | ICD-10-CM | POA: Diagnosis present

## 2018-04-25 DIAGNOSIS — K2970 Gastritis, unspecified, without bleeding: Secondary | ICD-10-CM | POA: Diagnosis present

## 2018-04-25 DIAGNOSIS — Z951 Presence of aortocoronary bypass graft: Secondary | ICD-10-CM | POA: Diagnosis not present

## 2018-04-30 DIAGNOSIS — I2510 Atherosclerotic heart disease of native coronary artery without angina pectoris: Secondary | ICD-10-CM | POA: Diagnosis not present

## 2018-04-30 DIAGNOSIS — I10 Essential (primary) hypertension: Secondary | ICD-10-CM | POA: Diagnosis not present

## 2018-04-30 DIAGNOSIS — I482 Chronic atrial fibrillation: Secondary | ICD-10-CM | POA: Diagnosis not present

## 2018-04-30 DIAGNOSIS — K31811 Angiodysplasia of stomach and duodenum with bleeding: Secondary | ICD-10-CM | POA: Diagnosis not present

## 2018-05-09 DIAGNOSIS — D649 Anemia, unspecified: Secondary | ICD-10-CM | POA: Diagnosis not present

## 2018-05-09 DIAGNOSIS — Z86010 Personal history of colonic polyps: Secondary | ICD-10-CM | POA: Diagnosis not present

## 2018-05-09 DIAGNOSIS — I998 Other disorder of circulatory system: Secondary | ICD-10-CM | POA: Diagnosis not present

## 2018-05-12 ENCOUNTER — Ambulatory Visit: Payer: Medicare Other | Admitting: Family Medicine

## 2018-05-19 DIAGNOSIS — I482 Chronic atrial fibrillation: Secondary | ICD-10-CM | POA: Diagnosis not present

## 2018-05-19 DIAGNOSIS — I10 Essential (primary) hypertension: Secondary | ICD-10-CM | POA: Diagnosis not present

## 2018-05-19 DIAGNOSIS — I2510 Atherosclerotic heart disease of native coronary artery without angina pectoris: Secondary | ICD-10-CM | POA: Diagnosis not present

## 2018-05-19 DIAGNOSIS — K31811 Angiodysplasia of stomach and duodenum with bleeding: Secondary | ICD-10-CM | POA: Diagnosis not present

## 2018-05-21 DIAGNOSIS — Z853 Personal history of malignant neoplasm of breast: Secondary | ICD-10-CM | POA: Diagnosis not present

## 2018-05-21 DIAGNOSIS — R410 Disorientation, unspecified: Secondary | ICD-10-CM | POA: Diagnosis not present

## 2018-05-21 DIAGNOSIS — R748 Abnormal levels of other serum enzymes: Secondary | ICD-10-CM | POA: Diagnosis present

## 2018-05-21 DIAGNOSIS — E871 Hypo-osmolality and hyponatremia: Secondary | ICD-10-CM | POA: Diagnosis not present

## 2018-05-21 DIAGNOSIS — M2550 Pain in unspecified joint: Secondary | ICD-10-CM | POA: Diagnosis not present

## 2018-05-21 DIAGNOSIS — R633 Feeding difficulties: Secondary | ICD-10-CM | POA: Diagnosis not present

## 2018-05-21 DIAGNOSIS — E860 Dehydration: Secondary | ICD-10-CM | POA: Diagnosis not present

## 2018-05-21 DIAGNOSIS — D649 Anemia, unspecified: Secondary | ICD-10-CM | POA: Diagnosis not present

## 2018-05-21 DIAGNOSIS — R627 Adult failure to thrive: Secondary | ICD-10-CM | POA: Diagnosis not present

## 2018-05-21 DIAGNOSIS — Z431 Encounter for attention to gastrostomy: Secondary | ICD-10-CM | POA: Diagnosis not present

## 2018-05-21 DIAGNOSIS — I2510 Atherosclerotic heart disease of native coronary artery without angina pectoris: Secondary | ICD-10-CM | POA: Diagnosis present

## 2018-05-21 DIAGNOSIS — I10 Essential (primary) hypertension: Secondary | ICD-10-CM | POA: Diagnosis present

## 2018-05-21 DIAGNOSIS — E46 Unspecified protein-calorie malnutrition: Secondary | ICD-10-CM | POA: Diagnosis not present

## 2018-05-21 DIAGNOSIS — Z6826 Body mass index (BMI) 26.0-26.9, adult: Secondary | ICD-10-CM | POA: Diagnosis not present

## 2018-05-21 DIAGNOSIS — E785 Hyperlipidemia, unspecified: Secondary | ICD-10-CM | POA: Diagnosis present

## 2018-05-21 DIAGNOSIS — Z7901 Long term (current) use of anticoagulants: Secondary | ICD-10-CM | POA: Diagnosis not present

## 2018-05-21 DIAGNOSIS — Z951 Presence of aortocoronary bypass graft: Secondary | ICD-10-CM | POA: Diagnosis not present

## 2018-05-21 DIAGNOSIS — R404 Transient alteration of awareness: Secondary | ICD-10-CM | POA: Diagnosis not present

## 2018-05-21 DIAGNOSIS — J9610 Chronic respiratory failure, unspecified whether with hypoxia or hypercapnia: Secondary | ICD-10-CM | POA: Diagnosis not present

## 2018-05-21 DIAGNOSIS — N390 Urinary tract infection, site not specified: Secondary | ICD-10-CM | POA: Diagnosis present

## 2018-05-21 DIAGNOSIS — E861 Hypovolemia: Secondary | ICD-10-CM | POA: Diagnosis not present

## 2018-05-21 DIAGNOSIS — R791 Abnormal coagulation profile: Secondary | ICD-10-CM | POA: Diagnosis not present

## 2018-05-21 DIAGNOSIS — J449 Chronic obstructive pulmonary disease, unspecified: Secondary | ICD-10-CM | POA: Diagnosis present

## 2018-05-21 DIAGNOSIS — R799 Abnormal finding of blood chemistry, unspecified: Secondary | ICD-10-CM | POA: Diagnosis not present

## 2018-05-21 DIAGNOSIS — B961 Klebsiella pneumoniae [K. pneumoniae] as the cause of diseases classified elsewhere: Secondary | ICD-10-CM | POA: Diagnosis present

## 2018-05-21 DIAGNOSIS — R531 Weakness: Secondary | ICD-10-CM | POA: Diagnosis not present

## 2018-05-21 DIAGNOSIS — I482 Chronic atrial fibrillation: Secondary | ICD-10-CM | POA: Diagnosis present

## 2018-05-21 DIAGNOSIS — Z7401 Bed confinement status: Secondary | ICD-10-CM | POA: Diagnosis not present

## 2018-05-21 DIAGNOSIS — R1310 Dysphagia, unspecified: Secondary | ICD-10-CM | POA: Diagnosis not present

## 2018-05-21 DIAGNOSIS — K31811 Angiodysplasia of stomach and duodenum with bleeding: Secondary | ICD-10-CM | POA: Diagnosis not present

## 2018-05-21 DIAGNOSIS — R1312 Dysphagia, oropharyngeal phase: Secondary | ICD-10-CM | POA: Diagnosis present

## 2018-05-21 DIAGNOSIS — K2970 Gastritis, unspecified, without bleeding: Secondary | ICD-10-CM | POA: Diagnosis present

## 2018-05-21 DIAGNOSIS — F0390 Unspecified dementia without behavioral disturbance: Secondary | ICD-10-CM | POA: Diagnosis not present

## 2018-05-21 DIAGNOSIS — E162 Hypoglycemia, unspecified: Secondary | ICD-10-CM | POA: Diagnosis not present

## 2018-05-21 DIAGNOSIS — E876 Hypokalemia: Secondary | ICD-10-CM | POA: Diagnosis not present

## 2018-05-21 DIAGNOSIS — I248 Other forms of acute ischemic heart disease: Secondary | ICD-10-CM | POA: Diagnosis not present

## 2018-05-21 DIAGNOSIS — Z9981 Dependence on supplemental oxygen: Secondary | ICD-10-CM | POA: Diagnosis not present

## 2018-05-21 DIAGNOSIS — R7989 Other specified abnormal findings of blood chemistry: Secondary | ICD-10-CM | POA: Diagnosis not present

## 2018-05-29 DIAGNOSIS — R410 Disorientation, unspecified: Secondary | ICD-10-CM | POA: Diagnosis not present

## 2018-05-29 DIAGNOSIS — I10 Essential (primary) hypertension: Secondary | ICD-10-CM | POA: Diagnosis not present

## 2018-05-29 DIAGNOSIS — Z7401 Bed confinement status: Secondary | ICD-10-CM | POA: Diagnosis not present

## 2018-05-29 DIAGNOSIS — E876 Hypokalemia: Secondary | ICD-10-CM | POA: Diagnosis not present

## 2018-05-29 DIAGNOSIS — R0602 Shortness of breath: Secondary | ICD-10-CM | POA: Diagnosis not present

## 2018-05-29 DIAGNOSIS — E871 Hypo-osmolality and hyponatremia: Secondary | ICD-10-CM | POA: Diagnosis not present

## 2018-05-29 DIAGNOSIS — R404 Transient alteration of awareness: Secondary | ICD-10-CM | POA: Diagnosis not present

## 2018-05-29 DIAGNOSIS — I482 Chronic atrial fibrillation: Secondary | ICD-10-CM | POA: Diagnosis not present

## 2018-05-29 DIAGNOSIS — F0390 Unspecified dementia without behavioral disturbance: Secondary | ICD-10-CM | POA: Diagnosis not present

## 2018-05-29 DIAGNOSIS — K5900 Constipation, unspecified: Secondary | ICD-10-CM | POA: Diagnosis not present

## 2018-05-29 DIAGNOSIS — K219 Gastro-esophageal reflux disease without esophagitis: Secondary | ICD-10-CM | POA: Diagnosis not present

## 2018-05-29 DIAGNOSIS — I2510 Atherosclerotic heart disease of native coronary artery without angina pectoris: Secondary | ICD-10-CM | POA: Diagnosis not present

## 2018-05-29 DIAGNOSIS — K31811 Angiodysplasia of stomach and duodenum with bleeding: Secondary | ICD-10-CM | POA: Diagnosis not present

## 2018-05-29 DIAGNOSIS — E861 Hypovolemia: Secondary | ICD-10-CM | POA: Diagnosis not present

## 2018-05-29 DIAGNOSIS — R1310 Dysphagia, unspecified: Secondary | ICD-10-CM | POA: Diagnosis not present

## 2018-05-29 DIAGNOSIS — R7989 Other specified abnormal findings of blood chemistry: Secondary | ICD-10-CM | POA: Diagnosis not present

## 2018-05-29 DIAGNOSIS — D72829 Elevated white blood cell count, unspecified: Secondary | ICD-10-CM | POA: Diagnosis not present

## 2018-05-29 DIAGNOSIS — E785 Hyperlipidemia, unspecified: Secondary | ICD-10-CM | POA: Diagnosis not present

## 2018-05-29 DIAGNOSIS — M2550 Pain in unspecified joint: Secondary | ICD-10-CM | POA: Diagnosis not present

## 2018-05-30 ENCOUNTER — Ambulatory Visit: Payer: Medicare Other | Admitting: Family Medicine

## 2018-06-01 DIAGNOSIS — K31811 Angiodysplasia of stomach and duodenum with bleeding: Secondary | ICD-10-CM | POA: Diagnosis not present

## 2018-06-01 DIAGNOSIS — I2510 Atherosclerotic heart disease of native coronary artery without angina pectoris: Secondary | ICD-10-CM | POA: Diagnosis not present

## 2018-06-01 DIAGNOSIS — I10 Essential (primary) hypertension: Secondary | ICD-10-CM | POA: Diagnosis not present

## 2018-06-01 DIAGNOSIS — I482 Chronic atrial fibrillation: Secondary | ICD-10-CM | POA: Diagnosis not present

## 2018-06-04 DIAGNOSIS — I2510 Atherosclerotic heart disease of native coronary artery without angina pectoris: Secondary | ICD-10-CM | POA: Diagnosis not present

## 2018-06-04 DIAGNOSIS — I10 Essential (primary) hypertension: Secondary | ICD-10-CM | POA: Diagnosis not present

## 2018-06-04 DIAGNOSIS — I482 Chronic atrial fibrillation: Secondary | ICD-10-CM | POA: Diagnosis not present

## 2018-06-04 DIAGNOSIS — K31811 Angiodysplasia of stomach and duodenum with bleeding: Secondary | ICD-10-CM | POA: Diagnosis not present

## 2018-06-10 DIAGNOSIS — E785 Hyperlipidemia, unspecified: Secondary | ICD-10-CM | POA: Diagnosis not present

## 2018-06-10 DIAGNOSIS — K31811 Angiodysplasia of stomach and duodenum with bleeding: Secondary | ICD-10-CM | POA: Diagnosis not present

## 2018-06-10 DIAGNOSIS — I2510 Atherosclerotic heart disease of native coronary artery without angina pectoris: Secondary | ICD-10-CM | POA: Diagnosis not present

## 2018-06-10 DIAGNOSIS — I482 Chronic atrial fibrillation: Secondary | ICD-10-CM | POA: Diagnosis not present

## 2018-06-22 DIAGNOSIS — F0390 Unspecified dementia without behavioral disturbance: Secondary | ICD-10-CM | POA: Diagnosis not present

## 2018-06-22 DIAGNOSIS — R1310 Dysphagia, unspecified: Secondary | ICD-10-CM | POA: Diagnosis not present

## 2018-06-24 DIAGNOSIS — I482 Chronic atrial fibrillation: Secondary | ICD-10-CM | POA: Diagnosis not present

## 2018-06-24 DIAGNOSIS — K31811 Angiodysplasia of stomach and duodenum with bleeding: Secondary | ICD-10-CM | POA: Diagnosis not present

## 2018-06-24 DIAGNOSIS — K219 Gastro-esophageal reflux disease without esophagitis: Secondary | ICD-10-CM | POA: Diagnosis not present

## 2018-06-24 DIAGNOSIS — I2510 Atherosclerotic heart disease of native coronary artery without angina pectoris: Secondary | ICD-10-CM | POA: Diagnosis not present

## 2018-07-16 DEATH — deceased
